# Patient Record
Sex: Female | Born: 1960 | Race: White | Hispanic: No | Marital: Married | Smoking: Never smoker
Health system: Southern US, Community
[De-identification: ages and names within clinical notes are randomized; demographics above are authoritative.]

## PROBLEM LIST (undated history)

## (undated) DIAGNOSIS — I499 Cardiac arrhythmia, unspecified: Secondary | ICD-10-CM

## (undated) DIAGNOSIS — G43909 Migraine, unspecified, not intractable, without status migrainosus: Secondary | ICD-10-CM

## (undated) DIAGNOSIS — M797 Fibromyalgia: Secondary | ICD-10-CM

## (undated) DIAGNOSIS — Z8489 Family history of other specified conditions: Secondary | ICD-10-CM

## (undated) DIAGNOSIS — F32A Depression, unspecified: Secondary | ICD-10-CM

## (undated) DIAGNOSIS — T8859XA Other complications of anesthesia, initial encounter: Secondary | ICD-10-CM

## (undated) DIAGNOSIS — E785 Hyperlipidemia, unspecified: Secondary | ICD-10-CM

## (undated) DIAGNOSIS — G4733 Obstructive sleep apnea (adult) (pediatric): Secondary | ICD-10-CM

## (undated) DIAGNOSIS — I10 Essential (primary) hypertension: Secondary | ICD-10-CM

## (undated) DIAGNOSIS — R569 Unspecified convulsions: Secondary | ICD-10-CM

## (undated) DIAGNOSIS — F319 Bipolar disorder, unspecified: Secondary | ICD-10-CM

## (undated) DIAGNOSIS — F988 Other specified behavioral and emotional disorders with onset usually occurring in childhood and adolescence: Secondary | ICD-10-CM

## (undated) DIAGNOSIS — F419 Anxiety disorder, unspecified: Secondary | ICD-10-CM

## (undated) DIAGNOSIS — K219 Gastro-esophageal reflux disease without esophagitis: Secondary | ICD-10-CM

## (undated) DIAGNOSIS — M1990 Unspecified osteoarthritis, unspecified site: Secondary | ICD-10-CM

## (undated) DIAGNOSIS — T4145XA Adverse effect of unspecified anesthetic, initial encounter: Secondary | ICD-10-CM

## (undated) DIAGNOSIS — F329 Major depressive disorder, single episode, unspecified: Secondary | ICD-10-CM

## (undated) HISTORY — PX: JOINT REPLACEMENT: SHX530

## (undated) HISTORY — PX: COLONOSCOPY: SHX174

## (undated) HISTORY — PX: ABDOMINAL HYSTERECTOMY: SHX81

## (undated) HISTORY — PX: TOTAL KNEE ARTHROPLASTY: SHX125

## (undated) HISTORY — PX: BACK SURGERY: SHX140

---

## 2004-03-19 ENCOUNTER — Ambulatory Visit: Payer: Self-pay | Admitting: Internal Medicine

## 2004-04-11 ENCOUNTER — Ambulatory Visit: Payer: Self-pay | Admitting: Rheumatology

## 2004-06-30 ENCOUNTER — Ambulatory Visit: Payer: Self-pay | Admitting: Internal Medicine

## 2004-07-09 ENCOUNTER — Ambulatory Visit: Payer: Self-pay | Admitting: Internal Medicine

## 2004-07-16 ENCOUNTER — Emergency Department: Payer: Self-pay | Admitting: Internal Medicine

## 2004-08-19 ENCOUNTER — Ambulatory Visit: Payer: Self-pay

## 2005-04-07 ENCOUNTER — Ambulatory Visit: Payer: Self-pay

## 2005-07-07 ENCOUNTER — Ambulatory Visit: Payer: Self-pay | Admitting: Orthopaedic Surgery

## 2005-07-27 ENCOUNTER — Other Ambulatory Visit: Payer: Self-pay

## 2005-07-27 ENCOUNTER — Ambulatory Visit: Payer: Self-pay | Admitting: Orthopaedic Surgery

## 2005-07-28 ENCOUNTER — Ambulatory Visit: Payer: Self-pay | Admitting: Orthopaedic Surgery

## 2005-09-09 ENCOUNTER — Ambulatory Visit: Payer: Self-pay | Admitting: Internal Medicine

## 2006-10-29 ENCOUNTER — Ambulatory Visit: Payer: Self-pay | Admitting: Internal Medicine

## 2007-03-10 ENCOUNTER — Ambulatory Visit: Payer: Self-pay | Admitting: Internal Medicine

## 2007-06-13 ENCOUNTER — Other Ambulatory Visit: Payer: Self-pay

## 2007-06-13 ENCOUNTER — Ambulatory Visit: Payer: Self-pay | Admitting: Orthopaedic Surgery

## 2007-06-21 ENCOUNTER — Inpatient Hospital Stay: Payer: Self-pay | Admitting: Orthopaedic Surgery

## 2007-12-07 ENCOUNTER — Ambulatory Visit: Payer: Self-pay | Admitting: Internal Medicine

## 2008-12-07 ENCOUNTER — Ambulatory Visit: Payer: Self-pay | Admitting: Internal Medicine

## 2010-11-05 ENCOUNTER — Ambulatory Visit: Payer: Self-pay | Admitting: Internal Medicine

## 2012-04-08 ENCOUNTER — Ambulatory Visit: Payer: Self-pay | Admitting: Gastroenterology

## 2012-05-26 ENCOUNTER — Ambulatory Visit: Payer: Self-pay | Admitting: Internal Medicine

## 2013-05-29 ENCOUNTER — Ambulatory Visit: Payer: Self-pay | Admitting: Internal Medicine

## 2014-09-25 ENCOUNTER — Ambulatory Visit: Payer: Medicare Other

## 2014-11-21 ENCOUNTER — Other Ambulatory Visit: Payer: Self-pay | Admitting: Internal Medicine

## 2014-11-21 DIAGNOSIS — Z139 Encounter for screening, unspecified: Secondary | ICD-10-CM

## 2014-11-21 DIAGNOSIS — N951 Menopausal and female climacteric states: Secondary | ICD-10-CM

## 2014-11-23 ENCOUNTER — Other Ambulatory Visit: Payer: Self-pay | Admitting: Internal Medicine

## 2014-11-23 DIAGNOSIS — Z1231 Encounter for screening mammogram for malignant neoplasm of breast: Secondary | ICD-10-CM

## 2014-11-28 ENCOUNTER — Ambulatory Visit: Payer: Medicare Other | Attending: Internal Medicine

## 2015-03-26 HISTORY — PX: HAMMER TOE SURGERY: SHX385

## 2015-05-10 ENCOUNTER — Other Ambulatory Visit: Payer: Self-pay | Admitting: Orthopedic Surgery

## 2015-06-03 ENCOUNTER — Encounter (HOSPITAL_COMMUNITY): Payer: Self-pay | Admitting: *Deleted

## 2015-06-04 ENCOUNTER — Other Ambulatory Visit: Payer: Self-pay

## 2015-06-04 ENCOUNTER — Encounter (HOSPITAL_BASED_OUTPATIENT_CLINIC_OR_DEPARTMENT_OTHER)
Admission: RE | Admit: 2015-06-04 | Discharge: 2015-06-04 | Disposition: A | Discharge status: Home / Self Care | Payer: 59 | Source: Ambulatory Visit | Attending: Orthopedic Surgery | Admitting: Orthopedic Surgery

## 2015-06-04 DIAGNOSIS — G4730 Sleep apnea, unspecified: Secondary | ICD-10-CM | POA: Diagnosis not present

## 2015-06-04 DIAGNOSIS — M19071 Primary osteoarthritis, right ankle and foot: Secondary | ICD-10-CM | POA: Diagnosis not present

## 2015-06-04 DIAGNOSIS — I10 Essential (primary) hypertension: Secondary | ICD-10-CM | POA: Diagnosis not present

## 2015-06-04 DIAGNOSIS — F329 Major depressive disorder, single episode, unspecified: Secondary | ICD-10-CM | POA: Diagnosis not present

## 2015-06-04 DIAGNOSIS — M2042 Other hammer toe(s) (acquired), left foot: Secondary | ICD-10-CM | POA: Diagnosis present

## 2015-06-04 DIAGNOSIS — F319 Bipolar disorder, unspecified: Secondary | ICD-10-CM | POA: Diagnosis not present

## 2015-06-04 DIAGNOSIS — K219 Gastro-esophageal reflux disease without esophagitis: Secondary | ICD-10-CM | POA: Diagnosis not present

## 2015-06-04 DIAGNOSIS — M170 Bilateral primary osteoarthritis of knee: Secondary | ICD-10-CM | POA: Diagnosis not present

## 2015-06-04 DIAGNOSIS — Z79899 Other long term (current) drug therapy: Secondary | ICD-10-CM | POA: Diagnosis not present

## 2015-06-04 DIAGNOSIS — M7742 Metatarsalgia, left foot: Secondary | ICD-10-CM | POA: Diagnosis not present

## 2015-06-04 DIAGNOSIS — M19072 Primary osteoarthritis, left ankle and foot: Secondary | ICD-10-CM | POA: Diagnosis not present

## 2015-06-04 LAB — BASIC METABOLIC PANEL
Anion gap: 9 (ref 5–15)
BUN: 14 mg/dL (ref 6–20)
CALCIUM: 9.5 mg/dL (ref 8.9–10.3)
CO2: 24 mmol/L (ref 22–32)
CREATININE: 0.78 mg/dL (ref 0.44–1.00)
Chloride: 109 mmol/L (ref 101–111)
Glucose, Bld: 113 mg/dL — ABNORMAL HIGH (ref 65–99)
Potassium: 4 mmol/L (ref 3.5–5.1)
SODIUM: 142 mmol/L (ref 135–145)

## 2015-06-06 ENCOUNTER — Encounter (HOSPITAL_BASED_OUTPATIENT_CLINIC_OR_DEPARTMENT_OTHER): Payer: Self-pay | Admitting: Certified Registered"

## 2015-06-06 ENCOUNTER — Ambulatory Visit (HOSPITAL_BASED_OUTPATIENT_CLINIC_OR_DEPARTMENT_OTHER): Payer: 59 | Admitting: Certified Registered"

## 2015-06-06 ENCOUNTER — Ambulatory Visit (HOSPITAL_COMMUNITY)
Admission: RE | Admit: 2015-06-06 | Discharge: 2015-06-06 | Disposition: A | Discharge status: Home / Self Care | Payer: 59 | Source: Ambulatory Visit | Attending: Orthopedic Surgery | Admitting: Orthopedic Surgery

## 2015-06-06 ENCOUNTER — Encounter (HOSPITAL_BASED_OUTPATIENT_CLINIC_OR_DEPARTMENT_OTHER)
Admission: RE | Disposition: A | Discharge status: Home / Self Care | Payer: Self-pay | Source: Ambulatory Visit | Attending: Orthopedic Surgery

## 2015-06-06 DIAGNOSIS — M19072 Primary osteoarthritis, left ankle and foot: Secondary | ICD-10-CM | POA: Insufficient documentation

## 2015-06-06 DIAGNOSIS — M2042 Other hammer toe(s) (acquired), left foot: Secondary | ICD-10-CM | POA: Insufficient documentation

## 2015-06-06 DIAGNOSIS — F329 Major depressive disorder, single episode, unspecified: Secondary | ICD-10-CM | POA: Insufficient documentation

## 2015-06-06 DIAGNOSIS — I10 Essential (primary) hypertension: Secondary | ICD-10-CM | POA: Insufficient documentation

## 2015-06-06 DIAGNOSIS — M25572 Pain in left ankle and joints of left foot: Secondary | ICD-10-CM

## 2015-06-06 DIAGNOSIS — G4730 Sleep apnea, unspecified: Secondary | ICD-10-CM | POA: Insufficient documentation

## 2015-06-06 DIAGNOSIS — K219 Gastro-esophageal reflux disease without esophagitis: Secondary | ICD-10-CM | POA: Insufficient documentation

## 2015-06-06 DIAGNOSIS — M7742 Metatarsalgia, left foot: Secondary | ICD-10-CM | POA: Insufficient documentation

## 2015-06-06 DIAGNOSIS — M19071 Primary osteoarthritis, right ankle and foot: Secondary | ICD-10-CM | POA: Insufficient documentation

## 2015-06-06 DIAGNOSIS — Z79899 Other long term (current) drug therapy: Secondary | ICD-10-CM | POA: Insufficient documentation

## 2015-06-06 DIAGNOSIS — F319 Bipolar disorder, unspecified: Secondary | ICD-10-CM | POA: Insufficient documentation

## 2015-06-06 DIAGNOSIS — M170 Bilateral primary osteoarthritis of knee: Secondary | ICD-10-CM | POA: Insufficient documentation

## 2015-06-06 HISTORY — DX: Essential (primary) hypertension: I10

## 2015-06-06 HISTORY — DX: Anxiety disorder, unspecified: F41.9

## 2015-06-06 HISTORY — DX: Bipolar disorder, unspecified: F31.9

## 2015-06-06 HISTORY — DX: Major depressive disorder, single episode, unspecified: F32.9

## 2015-06-06 HISTORY — PX: WEIL OSTEOTOMY: SHX5044

## 2015-06-06 HISTORY — DX: Unspecified osteoarthritis, unspecified site: M19.90

## 2015-06-06 HISTORY — PX: HAMMER TOE SURGERY: SHX385

## 2015-06-06 HISTORY — DX: Adverse effect of unspecified anesthetic, initial encounter: T41.45XA

## 2015-06-06 HISTORY — DX: Gastro-esophageal reflux disease without esophagitis: K21.9

## 2015-06-06 HISTORY — DX: Depression, unspecified: F32.A

## 2015-06-06 HISTORY — DX: Other complications of anesthesia, initial encounter: T88.59XA

## 2015-06-06 SURGERY — OSTEOTOMY, WEIL
Anesthesia: General | Site: Foot | Laterality: Left

## 2015-06-06 MED ORDER — ONDANSETRON HCL 4 MG/2ML IJ SOLN: 4.0000 mg | Freq: Once | INTRAMUSCULAR | Status: DC | PRN

## 2015-06-06 MED ORDER — SODIUM CHLORIDE 0.9 % IV SOLN: INTRAVENOUS | Status: DC

## 2015-06-06 MED ORDER — MIDAZOLAM HCL 2 MG/2ML IJ SOLN
INTRAMUSCULAR | Status: AC
  Filled 2015-06-06: qty 2

## 2015-06-06 MED ORDER — PHENYLEPHRINE 40 MCG/ML (10ML) SYRINGE FOR IV PUSH (FOR BLOOD PRESSURE SUPPORT)
PREFILLED_SYRINGE | INTRAVENOUS | Status: AC
  Filled 2015-06-06: qty 10

## 2015-06-06 MED ORDER — HYDROMORPHONE HCL 1 MG/ML IJ SOLN: 0.5000 mg | INTRAMUSCULAR | Status: DC | PRN

## 2015-06-06 MED ORDER — LIDOCAINE HCL (CARDIAC) 20 MG/ML IV SOLN
INTRAVENOUS | Status: DC | PRN
  Administered 2015-06-06: 30 mg via INTRAVENOUS

## 2015-06-06 MED ORDER — FENTANYL CITRATE (PF) 100 MCG/2ML IJ SOLN
50.0000 ug | INTRAMUSCULAR | Status: DC | PRN
  Administered 2015-06-06: 100 ug via INTRAVENOUS
  Administered 2015-06-06: 25 ug via INTRAVENOUS

## 2015-06-06 MED ORDER — ONDANSETRON HCL 4 MG/2ML IJ SOLN
INTRAMUSCULAR | Status: AC
  Filled 2015-06-06: qty 2

## 2015-06-06 MED ORDER — CEFAZOLIN SODIUM-DEXTROSE 2-3 GM-% IV SOLR
INTRAVENOUS | Status: AC
  Filled 2015-06-06: qty 50

## 2015-06-06 MED ORDER — LIDOCAINE HCL (CARDIAC) 20 MG/ML IV SOLN
INTRAVENOUS | Status: AC
  Filled 2015-06-06: qty 5

## 2015-06-06 MED ORDER — OXYCODONE HCL 5 MG PO TABS: 5.0000 mg | ORAL_TABLET | ORAL | Status: DC | PRN

## 2015-06-06 MED ORDER — DEXAMETHASONE SODIUM PHOSPHATE 10 MG/ML IJ SOLN
INTRAMUSCULAR | Status: AC
  Filled 2015-06-06: qty 1

## 2015-06-06 MED ORDER — PHENYLEPHRINE HCL 10 MG/ML IJ SOLN
INTRAMUSCULAR | Status: DC | PRN
  Administered 2015-06-06 (×2): 40 ug via INTRAVENOUS

## 2015-06-06 MED ORDER — CHLORHEXIDINE GLUCONATE 4 % EX LIQD: 60.0000 mL | Freq: Once | CUTANEOUS | Status: DC

## 2015-06-06 MED ORDER — 0.9 % SODIUM CHLORIDE (POUR BTL) OPTIME
TOPICAL | Status: DC | PRN
Start: 2015-06-06 — End: 2015-06-06
  Administered 2015-06-06: 1000 mL

## 2015-06-06 MED ORDER — PROPOFOL 10 MG/ML IV BOLUS
INTRAVENOUS | Status: DC | PRN
  Administered 2015-06-06: 200 mg via INTRAVENOUS

## 2015-06-06 MED ORDER — DEXAMETHASONE SODIUM PHOSPHATE 10 MG/ML IJ SOLN
INTRAMUSCULAR | Status: DC | PRN
  Administered 2015-06-06: 10 mg via INTRAVENOUS

## 2015-06-06 MED ORDER — FENTANYL CITRATE (PF) 100 MCG/2ML IJ SOLN
INTRAMUSCULAR | Status: AC
  Filled 2015-06-06: qty 2

## 2015-06-06 MED ORDER — GLYCOPYRROLATE 0.2 MG/ML IJ SOLN: 0.2000 mg | Freq: Once | INTRAMUSCULAR | Status: DC | PRN

## 2015-06-06 MED ORDER — MIDAZOLAM HCL 2 MG/2ML IJ SOLN: 1.0000 mg | INTRAMUSCULAR | Status: DC | PRN

## 2015-06-06 MED ORDER — ONDANSETRON HCL 4 MG/2ML IJ SOLN
INTRAMUSCULAR | Status: DC | PRN
  Administered 2015-06-06: 4 mg via INTRAVENOUS

## 2015-06-06 MED ORDER — CEFAZOLIN SODIUM-DEXTROSE 2-3 GM-% IV SOLR
2.0000 g | INTRAVENOUS | Status: AC
  Administered 2015-06-06: 2 g via INTRAVENOUS

## 2015-06-06 MED ORDER — SCOPOLAMINE 1 MG/3DAYS TD PT72: 1.0000 | MEDICATED_PATCH | Freq: Once | TRANSDERMAL | Status: DC

## 2015-06-06 MED ORDER — VANCOMYCIN HCL 500 MG IV SOLR
INTRAVENOUS | Status: DC | PRN
Start: 2015-06-06 — End: 2015-06-06
  Administered 2015-06-06: 500 mg

## 2015-06-06 MED ORDER — SENNA 8.6 MG PO TABS: 2.0000 | ORAL_TABLET | Freq: Two times a day (BID) | ORAL | Status: DC

## 2015-06-06 MED ORDER — LACTATED RINGERS IV SOLN
INTRAVENOUS | Status: DC
  Administered 2015-06-06: 11:00:00 via INTRAVENOUS

## 2015-06-06 MED ORDER — DOCUSATE SODIUM 100 MG PO CAPS: 100.0000 mg | ORAL_CAPSULE | Freq: Two times a day (BID) | ORAL | Status: DC

## 2015-06-06 SURGICAL SUPPLY — 88 items
BANDAGE ESMARK 6X9 LF (GAUZE/BANDAGES/DRESSINGS) ×2 IMPLANT
BANDAGE GAUZE 4  KLING STR (GAUZE/BANDAGES/DRESSINGS) IMPLANT
BLADE AVERAGE 25X9 (BLADE) IMPLANT
BLADE LONG MED 25X9 (BLADE) ×3 IMPLANT
BLADE OSC/SAG .038X5.5 CUT EDG (BLADE) ×3 IMPLANT
BLADE SURG 15 STRL LF DISP TIS (BLADE) ×6 IMPLANT
BLADE SURG 15 STRL SS (BLADE) ×3
BNDG COHESIVE 4X5 TAN STRL (GAUZE/BANDAGES/DRESSINGS) ×3 IMPLANT
BNDG COHESIVE 6X5 TAN STRL LF (GAUZE/BANDAGES/DRESSINGS) IMPLANT
BNDG CONFORM 2 STRL LF (GAUZE/BANDAGES/DRESSINGS) IMPLANT
BNDG CONFORM 3 STRL LF (GAUZE/BANDAGES/DRESSINGS) ×3 IMPLANT
BNDG ESMARK 4X9 LF (GAUZE/BANDAGES/DRESSINGS) IMPLANT
BNDG ESMARK 6X9 LF (GAUZE/BANDAGES/DRESSINGS) ×3
CAP PIN PROTECTOR ORTHO WHT (CAP) IMPLANT
CHLORAPREP W/TINT 26ML (MISCELLANEOUS) ×3 IMPLANT
COVER BACK TABLE 60X90IN (DRAPES) ×3 IMPLANT
CUFF TOURNIQUET SINGLE 24IN (TOURNIQUET CUFF) IMPLANT
CUFF TOURNIQUET SINGLE 34IN LL (TOURNIQUET CUFF) ×3 IMPLANT
DECANTER SPIKE VIAL GLASS SM (MISCELLANEOUS) IMPLANT
DRAPE EXTREMITY T 121X128X90 (DRAPE) ×3 IMPLANT
DRAPE OEC MINIVIEW 54X84 (DRAPES) ×3 IMPLANT
DRAPE U-SHAPE 47X51 STRL (DRAPES) ×3 IMPLANT
DRSG MEPITEL 4X7.2 (GAUZE/BANDAGES/DRESSINGS) ×3 IMPLANT
DRSG PAD ABDOMINAL 8X10 ST (GAUZE/BANDAGES/DRESSINGS) ×6 IMPLANT
ELECT REM PT RETURN 9FT ADLT (ELECTROSURGICAL) ×3
ELECTRODE REM PT RTRN 9FT ADLT (ELECTROSURGICAL) ×2 IMPLANT
GAUZE SPONGE 4X4 12PLY STRL (GAUZE/BANDAGES/DRESSINGS) ×3 IMPLANT
GLOVE BIO SURGEON STRL SZ8 (GLOVE) ×3 IMPLANT
GLOVE BIOGEL PI IND STRL 7.0 (GLOVE) ×2 IMPLANT
GLOVE BIOGEL PI IND STRL 8 (GLOVE) ×4 IMPLANT
GLOVE BIOGEL PI INDICATOR 7.0 (GLOVE) ×1
GLOVE BIOGEL PI INDICATOR 8 (GLOVE) ×2
GLOVE ECLIPSE 6.5 STRL STRAW (GLOVE) ×3 IMPLANT
GLOVE ECLIPSE 7.5 STRL STRAW (GLOVE) ×3 IMPLANT
GLOVE EXAM NITRILE MD LF STRL (GLOVE) ×3 IMPLANT
GOWN STRL REUS W/ TWL LRG LVL3 (GOWN DISPOSABLE) ×2 IMPLANT
GOWN STRL REUS W/ TWL XL LVL3 (GOWN DISPOSABLE) ×4 IMPLANT
GOWN STRL REUS W/TWL LRG LVL3 (GOWN DISPOSABLE) ×1
GOWN STRL REUS W/TWL XL LVL3 (GOWN DISPOSABLE) ×2
GUIDEWIRE ORTHO 062 (WIRE) ×3 IMPLANT
K-WIRE .054X4 (WIRE) IMPLANT
K-WIRE .062X4 (WIRE) IMPLANT
NDL SUT 6 .5 CRC .975X.05 MAYO (NEEDLE) IMPLANT
NEEDLE HYPO 22GX1.5 SAFETY (NEEDLE) IMPLANT
NEEDLE HYPO 25X1 1.5 SAFETY (NEEDLE) IMPLANT
NEEDLE MAYO TAPER (NEEDLE)
NS IRRIG 1000ML POUR BTL (IV SOLUTION) ×3 IMPLANT
PACK BASIN DAY SURGERY FS (CUSTOM PROCEDURE TRAY) ×3 IMPLANT
PAD CAST 4YDX4 CTTN HI CHSV (CAST SUPPLIES) ×2 IMPLANT
PADDING CAST ABS 4INX4YD NS (CAST SUPPLIES)
PADDING CAST ABS COTTON 4X4 ST (CAST SUPPLIES) IMPLANT
PADDING CAST COTTON 4X4 STRL (CAST SUPPLIES) ×1
PADDING CAST COTTON 6X4 STRL (CAST SUPPLIES) ×3 IMPLANT
PASSER SUT SWANSON 36MM LOOP (INSTRUMENTS) IMPLANT
PENCIL BUTTON HOLSTER BLD 10FT (ELECTRODE) ×3 IMPLANT
SANITIZER HAND PURELL 535ML FO (MISCELLANEOUS) ×3 IMPLANT
SCREW ACUTRAK FUSION 24MM (Screw) ×3 IMPLANT
SCREW FUSION ACUTRAK 30 3000 (Screw) ×2 IMPLANT
SCREW FUSION ACUTRAK 30MM 3000 (Screw) ×3 IMPLANT
SCREW QUICK SNAP 2.0X12MM (Screw) ×3 IMPLANT
SCREW QUICK SNAP 2.0X14MM (Screw) ×3 IMPLANT
SHEET MEDIUM DRAPE 40X70 STRL (DRAPES) ×3 IMPLANT
SLEEVE SCD COMPRESS KNEE MED (MISCELLANEOUS) ×3 IMPLANT
SPLINT FAST PLASTER 5X30 (CAST SUPPLIES)
SPLINT PLASTER CAST FAST 5X30 (CAST SUPPLIES) IMPLANT
SPONGE LAP 18X18 X RAY DECT (DISPOSABLE) ×3 IMPLANT
STOCKINETTE 6  STRL (DRAPES) ×1
STOCKINETTE 6 STRL (DRAPES) ×2 IMPLANT
SUCTION FRAZIER HANDLE 10FR (MISCELLANEOUS) ×1
SUCTION TUBE FRAZIER 10FR DISP (MISCELLANEOUS) ×2 IMPLANT
SUT ETHIBOND 0 MO6 C/R (SUTURE) IMPLANT
SUT ETHIBOND 2 OS 4 DA (SUTURE) IMPLANT
SUT ETHILON 3 0 PS 1 (SUTURE) ×3 IMPLANT
SUT FIBERWIRE 2-0 18 17.9 3/8 (SUTURE)
SUT MERSILENE 2.0 SH NDLE (SUTURE) IMPLANT
SUT MNCRL AB 3-0 PS2 18 (SUTURE) ×3 IMPLANT
SUT MNCRL AB 4-0 PS2 18 (SUTURE) IMPLANT
SUT VIC AB 0 SH 27 (SUTURE) IMPLANT
SUT VIC AB 2-0 SH 27 (SUTURE)
SUT VIC AB 2-0 SH 27XBRD (SUTURE) IMPLANT
SUT VICRYL 0 UR6 27IN ABS (SUTURE) ×3 IMPLANT
SUTURE FIBERWR 2-0 18 17.9 3/8 (SUTURE) IMPLANT
SYR BULB 3OZ (MISCELLANEOUS) ×3 IMPLANT
SYR CONTROL 10ML LL (SYRINGE) IMPLANT
TOWEL OR 17X24 6PK STRL BLUE (TOWEL DISPOSABLE) ×9 IMPLANT
TUBE CONNECTING 20X1/4 (TUBING) ×3 IMPLANT
UNDERPAD 30X30 (UNDERPADS AND DIAPERS) ×3 IMPLANT
YANKAUER SUCT BULB TIP NO VENT (SUCTIONS) IMPLANT

## 2015-06-06 NOTE — Discharge Instructions (Addendum)
Whitney Simmer, MD [place]  Please read the following information regarding your care after surgery.  Medications  You only need a prescription for the narcotic pain medicine (ex. oxycodone, Percocet, Norco).  All of the other medicines listed below are available over the counter. X acetominophen (Tylenol) 650 mg every 4-6 hours as you need for minor pain X oxycodone as prescribed for moderate to severe pain ?   Narcotic pain medicine (ex. oxycodone, Percocet, Vicodin) will cause constipation.  To prevent this problem, take the following medicines while you are taking any pain medicine. X docusate sodium (Colace) 100 mg twice a day X senna (Senokot) 2 tablets twice a day   Weight Bearing X Bear weight when you are able on your operated leg or foot in flat post-op shoe.  Cast / Splint / Dressing X Keep your splint or cast clean and dry.  Dont put anything (coat hanger, pencil, etc) down inside of it.  If it gets damp, use a hair dryer on the cool setting to dry it.  If it gets soaked, call the office to schedule an appointment for a cast change.   After your dressing, cast or splint is removed; you may shower, but do not soak or scrub the wound.  Allow the water to run over it, and then gently pat it dry.  Swelling It is normal for you to have swelling where you had surgery.  To reduce swelling and pain, keep your toes above your nose for at least 3 days after surgery.  It may be necessary to keep your foot or leg elevated for several weeks.  If it hurts, it should be elevated.  Follow Up Call my office at [phone] when you are discharged from the hospital or surgery center to schedule an appointment to be seen two weeks after surgery.  Call my office at [phone] if you develop a fever >101.5 F, nausea, vomiting, bleeding from the surgical site or severe pain.     Regional Anesthesia Blocks  1. Numbness or the inability to move the "blocked" extremity may last  from 3-48 hours after placement. The length of time depends on the medication injected and your individual response to the medication. If the numbness is not going away after 48 hours, call your surgeon.  2. The extremity that is blocked will need to be protected until the numbness is gone and the  Strength has returned. Because you cannot feel it, you will need to take extra care to avoid injury. Because it may be weak, you may have difficulty moving it or using it. You may not know what position it is in without looking at it while the block is in effect.  3. For blocks in the legs and feet, returning to weight bearing and walking needs to be done carefully. You will need to wait until the numbness is entirely gone and the strength has returned. You should be able to move your leg and foot normally before you try and bear weight or walk. You will need someone to be with you when you first try to ensure you do not fall and possibly risk injury.  4. Bruising and tenderness at the needle site are common side effects and will resolve in a few days.  5. Persistent numbness or new problems with movement should be communicated to the surgeon or the [facility] [phone] [place] [phone]).     Post Anesthesia Home Care Instructions  Activity: Get  plenty of rest for the remainder of the day. A responsible adult should stay with you for 24 hours following the procedure.  For the next 24 hours, DO NOT: -Drive a car -Paediatric nurse -Drink alcoholic beverages -Take any medication unless instructed by your physician -Make any legal decisions or sign important papers.  Meals: Start with liquid foods such as gelatin or soup. Progress to regular foods as tolerated. Avoid greasy, spicy, heavy foods. If nausea and/or vomiting occur, drink only clear liquids until the nausea and/or vomiting subsides. Call your physician if vomiting continues.  Special  Instructions/Symptoms: Your throat may feel dry or sore from the anesthesia or the breathing tube placed in your throat during surgery. If this causes discomfort, gargle with warm salt water. The discomfort should disappear within 24 hours.  If you had a scopolamine patch placed behind your ear for the management of post- operative nausea and/or vomiting:  1. The medication in the patch is effective for 72 hours, after which it should be removed.  Wrap patch in a tissue and discard in the trash. Wash hands thoroughly with soap and water. 2. You may remove the patch earlier than 72 hours if you experience unpleasant side effects which may include dry mouth, dizziness or visual disturbances. 3. Avoid touching the patch. Wash your hands with soap and water after contact with the patch.

## 2015-06-06 NOTE — Op Note (Signed)
NAME:  Whitney Lewis, Whitney Lewis            ACCOUNT NO.:  [id number]  MEDICAL RECORD NO.:  [id number]  LOCATION:  PERIO                        FACILITY:  [facility]  PHYSICIAN:  [name], MD        DATE OF BIRTH:  1960/06/11  DATE OF PROCEDURE:  06/06/2015 DATE OF DISCHARGE:                              OPERATIVE REPORT   PREOPERATIVE DIAGNOSIS:  Left second and third metatarsalgia and hammertoes.  POSTOPERATIVE DIAGNOSIS:  Left second and third metatarsalgia and hammertoes.  PROCEDURE: 1. Left second metatarsal Weil osteotomy. 2. Left third metatarsal Weil osteotomy through a separate incision. 3. Left second hammertoe correction through a separate incision. 4. Left third hammertoe correction through a separate incision. 5. AP and lateral radiographs of the left foot.  SURGEON:  [name], MD.  ASSISTANT:  Mechele Claude, PA-C.  ANESTHESIA:  General, regional.  ESTIMATED BLOOD LOSS:  Minimal.  TOURNIQUET TIME:  Approximately 30 minutes at 250 mmHg.  COMPLICATIONS:  None apparent.  DISPOSITION:  Extubated, awake, and stable to recovery.  INDICATIONS FOR PROCEDURE:  The patient is a 55 year old female who has a painful left forefoot deformities including metatarsalgia and second and third hammertoes.  She has failed nonoperative treatment to date and presents today for surgical correction.  She understands the risks and benefits, the alternative treatment options, and elects surgical treatment.  She specifically understands risks of bleeding, infection, nerve damage, blood clots, need for additional surgery, continued pain, recurrence of her deformities, amputation, and death.  PROCEDURE IN DETAIL:  After preoperative consent was obtained and the correct operative site was identified, the patient was brought to the operating room and placed supine on the operating table.  General anesthesia was induced.  Preoperative antibiotics were administered. Surgical time-out was taken.   Left lower extremity was prepped and draped in standard sterile fashion with a tourniquet around the thigh. The extremity was exsanguinated and tourniquet was inflated to 250 mmHg. A longitudinal incision was made over the second MTP joint.  Sharp dissection was carried down through skin and subcutaneous tissue. Capsulotomy was made and the metatarsal head was exposed.  A Weil osteotomy was made in the metatarsal head removing a small wafer of bone distally.  The metatarsal was allowed to shorten several millimeters and was fixed with a 14-mm Acumed twist-off screw.  Attention was then turned to the third MTP joint where the same procedure was performed again fixing the osteotomy with a 12-mm Acumed twist-off screw.  The overhanging bone at both metatarsals was trimmed with a rongeur.  AP and lateral radiographs confirmed appropriate position of the screws, appropriate lengths of the metatarsals.  Attention was then turned to the second toe where a transverse incision was made over the PIP joint.  Sharp dissection was carried down through the skin and subcutaneous tissue.  The extensor mechanism was incised. The collateral ligaments were released.  The head of the proximal phalanx was resected with the oscillating saw.  The base of the middle phalanx was resected with a rongeur and curette.  The hammertoe was reduced and an Acumed Acutrak screw was used to fix the PIP joint.  Attention was turned to the third toe where the same procedure  was performed to correct the third hammertoe deformity with a 24-mm Acutrak screw.  AP and lateral radiographs confirmed appropriate position and length of all hardware and appropriate correction of the metatarsalgia as well as hammertoe deformities.  Wounds were irrigated copiously. Vancomycin powder was sprinkled in the wounds.  The subcutaneous tissues were approximated with Monocryl, the skin incisions were closed with nylon.  Sterile dressings  were applied, followed by a well-padded compression wrap.  Tourniquet was released at 32 minutes after application of the dressings.  The patient was awakened from anesthesia and transported to the recovery room in stable condition.  FOLLOWUP PLAN:  The patient will be weightbearing as tolerated on her heel in a flat postop shoe.  She will follow up with me in the office in 2 weeks for suture removal.  RADIOGRAPHS:  AP and lateral radiographs of the left foot were obtained intraoperatively.  These showed interval correction of hammertoe and metatarsalgia deformities with appropriately positioned hardware.  No other acute injuries are noted.  Mechele Claude, PA-C, was present and scrubbed for the duration of the case.  His assistance was essential in positioning the patient, prepping and draping, gaining and maintaining exposure, performing the operation, and closing and dressing the wounds.     [name], MD     JH/MEDQ  D:  06/06/2015  T:  06/06/2015  Job:  [id number]

## 2015-06-06 NOTE — Anesthesia Postprocedure Evaluation (Signed)
Anesthesia Post Note  Patient: Whitney Lewis  Procedure(s) Performed: Procedure(s) (LRB): LEFT TWO AND THREE METATARSAL WEIL OSTEOTOMY  (Left) LEFT TWO-THREE  HAMMER TOE CORRECTION  (Left)  Patient location during evaluation: PACU Anesthesia Type: General and Regional Level of consciousness: awake, oriented and patient cooperative Pain management: pain level controlled Vital Signs Assessment: post-procedure vital signs reviewed and stable Respiratory status: spontaneous breathing Cardiovascular status: blood pressure returned to baseline Postop Assessment: no headache Anesthetic complications: no    Last Vitals:  Filed Vitals:   06/06/15 1245 06/06/15 1315  BP: 97/71 98/75  Pulse: 90 87  Temp:  36.4 C  Resp: 13 16    Last Pain:  Filed Vitals:   06/06/15 1324  PainSc: 0-No pain                 Pharoah Goggins EDWARD

## 2015-06-06 NOTE — Progress Notes (Signed)
Assisted Dr. Tamala Julian with left, ankle block. Side rails up, monitors on throughout procedure. See vital signs in flow sheet. Tolerated Procedure well.

## 2015-06-06 NOTE — Brief Op Note (Addendum)
06/06/2015  11:56 AM  PATIENT:  Whitney Lewis  55 y.o. female  PRE-OPERATIVE DIAGNOSIS:  LEFT TWO-THREE HAMMERTOES AND METATARSALGIA  POST-OPERATIVE DIAGNOSIS:  LEFT TWO-THREE HAMMERTOES AND METATARSALGIA  Procedure(s): LEFT TWO AND THREE METATARSAL WEIL OSTEOTOMY (separate incisions) LEFT TWO-THREE  HAMMER TOE CORRECTION (separate incisions) Left foot AP and lateral xrays  SURGEON:  [name], MD  ASSISTANT: Mechele Claude, PA-C  ANESTHESIA:   General, regional  EBL:  minimal   TOURNIQUET:   Total Tourniquet Time Documented: Thigh (Left) - 32 minutes Total: Thigh (Left) - 32 minutes   COMPLICATIONS:  None apparent  DISPOSITION:  Extubated, awake and stable to recovery.  DICTATION ID:  [id number]

## 2015-06-06 NOTE — H&P (Signed)
Whitney Lewis is an 55 y.o. female.   Chief Complaint: left forefoot pain HPI: 55 y/o female with left 2nd and 3rd metatarsalgia and hammertoes.  She has failed non op treatment and presents today for surgical correction.  Past Medical History  Diagnosis Date  . Hypertension   . Sleep apnea     wears mouth piece, no CPAP  . Complication of anesthesia     hard to wake up  . Depression   . Bipolar disorder ([place])   . Anxiety   . GERD (gastroesophageal reflux disease)   . Headache   . Arthritis     feet, knees    Past Surgical History  Procedure Laterality Date  . Joint replacement Left   . Abdominal hysterectomy    . Foot surgery Right   . Cesarean section      History reviewed. No pertinent family history. Social History:  reports that she has never smoked. She does not have any smokeless tobacco history on file. She reports that she drinks alcohol. She reports that she does not use illicit drugs.  Allergies:  Allergies  Allergen Reactions  . Codeine Nausea Only    Medications Prior to Admission  Medication Sig Dispense Refill  . amphetamine-dextroamphetamine (ADDERALL) 30 MG tablet Take 30 mg by mouth daily.    . ARIPiprazole (ABILIFY) 15 MG tablet Take 15 mg by mouth daily.    . clonazePAM (KLONOPIN) 1 MG tablet Take 1 mg by mouth 2 (two) times daily.    . DULoxetine (CYMBALTA) 60 MG capsule Take 120 mg by mouth daily.    Marland Kitchen esomeprazole (NEXIUM) 40 MG capsule Take 40 mg by mouth daily at 12 noon.    Marland Kitchen lisinopril (PRINIVIL,ZESTRIL) 20 MG tablet Take 20 mg by mouth daily.    . Multiple Vitamin (MULTIVITAMIN WITH MINERALS) TABS tablet Take 1 tablet by mouth daily.    . phentermine 37.5 MG capsule Take 37.5 mg by mouth every morning.    . simvastatin (ZOCOR) 20 MG tablet Take 20 mg by mouth daily.    Marland Kitchen topiramate (TOPAMAX) 25 MG tablet Take 25 mg by mouth 2 (two) times daily.      Results for orders placed or performed during the hospital encounter of 06/06/15  (from the past 48 hour(s))  Basic metabolic panel     Status: Abnormal   Collection Time: 06/04/15  2:45 PM  Result Value Ref Range   Sodium 142 135 - 145 mmol/L   Potassium 4.0 3.5 - 5.1 mmol/L   Chloride 109 101 - 111 mmol/L   CO2 24 22 - 32 mmol/L   Glucose, Bld 113 (H) 65 - 99 mg/dL   BUN 14 6 - 20 mg/dL   Creatinine, Ser 0.78 0.44 - 1.00 mg/dL   Calcium 9.5 8.9 - 10.3 mg/dL   GFR calc non Af Amer >60 >60 mL/min   GFR calc Af Amer >60 >60 mL/min    Comment: (NOTE) The eGFR has been calculated using the CKD EPI equation. This calculation has not been validated in all clinical situations. eGFR's persistently <60 mL/min signify possible Chronic Kidney Disease.    Anion gap 9 5 - 15   No results found.  ROS  No recent f/c/n/v/wt loss  Height 5' 6.5" (1.689 m), weight 83.915 kg (185 lb). Physical Exam  wn wd woman in nad.  A and O x 4.  Mood and affect normal.  EOMi.  resp unlabored.  L foot with 2nd and 3rd  hammertoe deformities.  Skin healthy and intact.  No lymphadenopathy.  5/5 strength in PF and DF of the toes.  Sens to LT intact at the forefoot.  Assessment/Plan L 2nd and 3rd hammertoes and metatarsalgia.  To OR for surgical correction.  The risks and benefits of the alternative treatment options have been discussed in detail.  The patient wishes to proceed with surgery and specifically understands risks of bleeding, infection, nerve damage, blood clots, need for additional surgery, amputation and death.   Whitney Lewis Jul 02, 2015, 10:36 AM

## 2015-06-06 NOTE — Transfer of Care (Signed)
Immediate Anesthesia Transfer of Care Note  Patient: Whitney Lewis  Procedure(s) Performed: Procedure(s): LEFT TWO AND THREE METATARSAL WEIL OSTEOTOMY  (Left) LEFT TWO-THREE  HAMMER TOE CORRECTION  (Left)  Patient Location: PACU  Anesthesia Type:GA combined with regional for post-op pain  Level of Consciousness: awake and patient cooperative  Airway & Oxygen Therapy: Patient Spontanous Breathing and Patient connected to face mask oxygen  Post-op Assessment: Report given to RN and Post -op Vital signs reviewed and stable  Post vital signs: Reviewed and stable  Last Vitals:  Filed Vitals:   06/06/15 1045 06/06/15 1049  BP: 93/63   Pulse: 79 77  Temp:    Resp: 20 13    Complications: No apparent anesthesia complications

## 2015-06-06 NOTE — Anesthesia Preprocedure Evaluation (Addendum)
Anesthesia Evaluation  Patient identified by MRN, date of birth, ID band Patient awake    Reviewed: Allergy & Precautions, NPO status , Patient's Chart, lab work & pertinent test results  Airway Mallampati: II  TM Distance: >3 FB     Dental   Pulmonary sleep apnea ,    Pulmonary exam normal        Cardiovascular hypertension, Pt. on medications Normal cardiovascular exam     Neuro/Psych  Headaches, PSYCHIATRIC DISORDERS Anxiety Depression Bipolar Disorder    GI/Hepatic GERD  Medicated,  Endo/Other    Renal/GU      Musculoskeletal  (+) Arthritis ,   Abdominal   Peds  Hematology   Anesthesia Other Findings   Reproductive/Obstetrics                           Anesthesia Physical Anesthesia Plan  ASA: II  Anesthesia Plan: General LMA and General   Post-op Pain Management: MAC Combined w/ Regional for Post-op pain and GA combined w/ Regional for post-op pain   Induction: Intravenous  Airway Management Planned: LMA  Additional Equipment:   Intra-op Plan:   Post-operative Plan: Extubation in OR  Informed Consent: I have reviewed the patients History and Physical, chart, labs and discussed the procedure including the risks, benefits and alternatives for the proposed anesthesia with the patient or authorized representative who has indicated his/her understanding and acceptance.     Plan Discussed with: CRNA, Anesthesiologist and Surgeon  Anesthesia Plan Comments:        Anesthesia Quick Evaluation

## 2015-06-06 NOTE — Anesthesia Procedure Notes (Addendum)
Procedure Name: LMA Insertion Date/Time: 06/06/2015 11:03 AM Performed by: BLOCKER, TIMOTHY D Pre-anesthesia Checklist: Patient identified, Emergency Drugs available, Suction available and Patient being monitored Patient Re-evaluated:Patient Re-evaluated prior to inductionOxygen Delivery Method: Circle System Utilized Preoxygenation: Pre-oxygenation with 100% oxygen Intubation Type: IV induction Ventilation: Mask ventilation without difficulty LMA: LMA inserted LMA Size: 4.0 Number of attempts: 1 Airway Equipment and Method: Bite block Placement Confirmation: positive ETCO2 Tube secured with: Tape Dental Injury: Teeth and Oropharynx as per pre-operative assessment     Anesthesia Regional Block:  Ankle block  Pre-Anesthetic Checklist: ,, timeout performed, Correct Patient, Correct Site, Correct Laterality, Correct Procedure, Correct Position, site marked, Risks and benefits discussed,  Surgical consent,  Pre-op evaluation,  At surgeon's request and post-op pain management  Laterality: Left  Prep: Maximum Sterile Barrier Precautions used, chloraprep and alcohol swabs       Needles:  Injection technique: Single-shot      Needle Gauge: 25 and 25 G    Additional Needles: Ankle block Narrative:  Start time: 06/06/2015 10:55 AM End time: 06/06/2015 10:57 AM Injection made incrementally with aspirations every 5 mL.  Performed by: Personally  Anesthesiologist: Kate Sable  Additional Notes: Pt accept procedure w/ risks. 20cc 0.5% Marcaine w/o difficulty. GES

## 2015-06-07 ENCOUNTER — Encounter (HOSPITAL_BASED_OUTPATIENT_CLINIC_OR_DEPARTMENT_OTHER): Payer: Self-pay | Admitting: Orthopedic Surgery

## 2015-07-01 ENCOUNTER — Other Ambulatory Visit: Payer: Self-pay | Admitting: Orthopedic Surgery

## 2015-07-03 ENCOUNTER — Encounter (HOSPITAL_COMMUNITY): Payer: Self-pay | Admitting: *Deleted

## 2015-07-03 ENCOUNTER — Other Ambulatory Visit: Payer: Self-pay | Admitting: Orthopedic Surgery

## 2015-07-03 NOTE — Progress Notes (Signed)
Pt denies SOB, chest pain, and being under the care of a cardiologist. Pt denies having a cardiac cath and stress test but stated that an echo was done >25 years ago. Pt denies having an EKG and chest x ray within the last year. Pt made aware to stop taking Aspirin, otc vitamins, fish oil, Phentermine and herbal medications. Do not take any NSAIDs ie: Ibuprofen, Advil, Naproxen or any medication containing Aspirin. Ebony Hail, [place] , Anesthesia made aware that pt last dose of Phentermine was Saturday, 06/29/15 ( per pt). Pt verbalized understanding of all pre-op instructions.

## 2015-07-04 ENCOUNTER — Encounter (HOSPITAL_COMMUNITY)
Admission: RE | Disposition: A | Discharge status: Home Health | Payer: Self-pay | Source: Ambulatory Visit | Attending: Orthopedic Surgery

## 2015-07-04 ENCOUNTER — Encounter (HOSPITAL_COMMUNITY): Payer: Self-pay | Admitting: Critical Care Medicine

## 2015-07-04 ENCOUNTER — Inpatient Hospital Stay (HOSPITAL_COMMUNITY): Payer: 59 | Admitting: Critical Care Medicine

## 2015-07-04 ENCOUNTER — Inpatient Hospital Stay (HOSPITAL_COMMUNITY)
Admission: RE | Admit: 2015-07-04 | Discharge: 2015-07-08 | DRG: 504 | Disposition: A | Discharge status: Home Health | Payer: 59 | Source: Ambulatory Visit | Attending: Orthopedic Surgery | Admitting: Orthopedic Surgery

## 2015-07-04 DIAGNOSIS — F319 Bipolar disorder, unspecified: Secondary | ICD-10-CM | POA: Diagnosis present

## 2015-07-04 DIAGNOSIS — Z8249 Family history of ischemic heart disease and other diseases of the circulatory system: Secondary | ICD-10-CM | POA: Diagnosis not present

## 2015-07-04 DIAGNOSIS — I10 Essential (primary) hypertension: Secondary | ICD-10-CM | POA: Diagnosis present

## 2015-07-04 DIAGNOSIS — M869 Osteomyelitis, unspecified: Secondary | ICD-10-CM | POA: Diagnosis present

## 2015-07-04 DIAGNOSIS — L03031 Cellulitis of right toe: Secondary | ICD-10-CM | POA: Diagnosis present

## 2015-07-04 DIAGNOSIS — Y792 Prosthetic and other implants, materials and accessory orthopedic devices associated with adverse incidents: Secondary | ICD-10-CM | POA: Diagnosis present

## 2015-07-04 DIAGNOSIS — M86171 Other acute osteomyelitis, right ankle and foot: Secondary | ICD-10-CM | POA: Diagnosis present

## 2015-07-04 DIAGNOSIS — F419 Anxiety disorder, unspecified: Secondary | ICD-10-CM | POA: Diagnosis present

## 2015-07-04 DIAGNOSIS — L02611 Cutaneous abscess of right foot: Secondary | ICD-10-CM | POA: Diagnosis present

## 2015-07-04 DIAGNOSIS — Y834 Other reconstructive surgery as the cause of abnormal reaction of the patient, or of later complication, without mention of misadventure at the time of the procedure: Secondary | ICD-10-CM | POA: Diagnosis present

## 2015-07-04 DIAGNOSIS — Y838 Other surgical procedures as the cause of abnormal reaction of the patient, or of later complication, without mention of misadventure at the time of the procedure: Secondary | ICD-10-CM

## 2015-07-04 DIAGNOSIS — B9561 Methicillin susceptible Staphylococcus aureus infection as the cause of diseases classified elsewhere: Secondary | ICD-10-CM | POA: Diagnosis not present

## 2015-07-04 DIAGNOSIS — E785 Hyperlipidemia, unspecified: Secondary | ICD-10-CM | POA: Diagnosis present

## 2015-07-04 DIAGNOSIS — G4733 Obstructive sleep apnea (adult) (pediatric): Secondary | ICD-10-CM | POA: Diagnosis present

## 2015-07-04 DIAGNOSIS — Z9889 Other specified postprocedural states: Secondary | ICD-10-CM

## 2015-07-04 DIAGNOSIS — M797 Fibromyalgia: Secondary | ICD-10-CM | POA: Diagnosis present

## 2015-07-04 DIAGNOSIS — K219 Gastro-esophageal reflux disease without esophagitis: Secondary | ICD-10-CM | POA: Diagnosis present

## 2015-07-04 DIAGNOSIS — T8469XA Infection and inflammatory reaction due to internal fixation device of other site, initial encounter: Secondary | ICD-10-CM | POA: Diagnosis present

## 2015-07-04 DIAGNOSIS — Y92019 Unspecified place in single-family (private) house as the place of occurrence of the external cause: Secondary | ICD-10-CM

## 2015-07-04 DIAGNOSIS — A4901 Methicillin susceptible Staphylococcus aureus infection, unspecified site: Secondary | ICD-10-CM | POA: Insufficient documentation

## 2015-07-04 DIAGNOSIS — B9689 Other specified bacterial agents as the cause of diseases classified elsewhere: Secondary | ICD-10-CM

## 2015-07-04 DIAGNOSIS — T8469XD Infection and inflammatory reaction due to internal fixation device of other site, subsequent encounter: Secondary | ICD-10-CM | POA: Diagnosis not present

## 2015-07-04 HISTORY — DX: Hyperlipidemia, unspecified: E78.5

## 2015-07-04 HISTORY — DX: Family history of other specified conditions: Z84.89

## 2015-07-04 HISTORY — DX: Migraine, unspecified, not intractable, without status migrainosus: G43.909

## 2015-07-04 HISTORY — DX: Fibromyalgia: M79.7

## 2015-07-04 HISTORY — DX: Obstructive sleep apnea (adult) (pediatric): G47.33

## 2015-07-04 HISTORY — PX: INCISION AND DRAINAGE OF WOUND: SHX1803

## 2015-07-04 HISTORY — PX: IRRIGATION AND DEBRIDEMENT FOOT: SHX6602

## 2015-07-04 LAB — BASIC METABOLIC PANEL
Anion gap: 10 (ref 5–15)
BUN: 11 mg/dL (ref 6–20)
CALCIUM: 9.3 mg/dL (ref 8.9–10.3)
CO2: 23 mmol/L (ref 22–32)
CREATININE: 0.8 mg/dL (ref 0.44–1.00)
Chloride: 106 mmol/L (ref 101–111)
GFR calc Af Amer: >60 mL/min (ref >60)
Glucose, Bld: 110 mg/dL — ABNORMAL HIGH (ref 65–99)
POTASSIUM: 4.1 mmol/L (ref 3.5–5.1)
SODIUM: 139 mmol/L (ref 135–145)

## 2015-07-04 LAB — CBC
HEMATOCRIT: 38.9 % (ref 36.0–46.0)
Hemoglobin: 13.4 g/dL (ref 12.0–15.0)
MCH: 29.4 pg (ref 26.0–34.0)
MCHC: 34.4 g/dL (ref 30.0–36.0)
MCV: 85.3 fL (ref 78.0–100.0)
PLATELETS: 228 10*3/uL (ref 150–400)
RBC: 4.56 MIL/uL (ref 3.87–5.11)
RDW: 12.5 % (ref 11.5–15.5)
WBC: 7.4 10*3/uL (ref 4.0–10.5)

## 2015-07-04 SURGERY — IRRIGATION AND DEBRIDEMENT WOUND
Anesthesia: Monitor Anesthesia Care | Site: Toe | Laterality: Right

## 2015-07-04 MED ORDER — PROPOFOL 500 MG/50ML IV EMUL
INTRAVENOUS | Status: DC | PRN
  Administered 2015-07-04: 50 ug/kg/min via INTRAVENOUS

## 2015-07-04 MED ORDER — CHLORHEXIDINE GLUCONATE 4 % EX LIQD: 60.0000 mL | Freq: Once | CUTANEOUS | Status: DC

## 2015-07-04 MED ORDER — METOCLOPRAMIDE HCL 5 MG PO TABS: 5.0000 mg | ORAL_TABLET | Freq: Three times a day (TID) | ORAL | Status: DC | PRN

## 2015-07-04 MED ORDER — PROMETHAZINE HCL 25 MG/ML IJ SOLN: 6.2500 mg | INTRAMUSCULAR | Status: DC | PRN

## 2015-07-04 MED ORDER — ACETAMINOPHEN 650 MG RE SUPP: 650.0000 mg | Freq: Four times a day (QID) | RECTAL | Status: DC | PRN

## 2015-07-04 MED ORDER — ONDANSETRON HCL 4 MG/2ML IJ SOLN: 4.0000 mg | Freq: Four times a day (QID) | INTRAMUSCULAR | Status: DC | PRN

## 2015-07-04 MED ORDER — ONDANSETRON HCL 4 MG/2ML IJ SOLN
INTRAMUSCULAR | Status: DC | PRN
  Administered 2015-07-04: 4 mg via INTRAVENOUS

## 2015-07-04 MED ORDER — SODIUM CHLORIDE 0.9 % IV SOLN
INTRAVENOUS | Status: DC
  Administered 2015-07-04 – 2015-07-05 (×2): via INTRAVENOUS

## 2015-07-04 MED ORDER — CLONAZEPAM 1 MG PO TABS
1.0000 mg | ORAL_TABLET | Freq: Every day | ORAL | Status: DC
  Administered 2015-07-04 – 2015-07-07 (×4): 1 mg via ORAL
  Filled 2015-07-04 (×4): qty 1

## 2015-07-04 MED ORDER — CLONAZEPAM 0.5 MG PO TABS
0.5000 mg | ORAL_TABLET | Freq: Every day | ORAL | Status: DC
  Administered 2015-07-05 – 2015-07-08 (×5): 0.5 mg via ORAL
  Filled 2015-07-04 (×4): qty 1

## 2015-07-04 MED ORDER — SENNA 8.6 MG PO TABS
1.0000 | ORAL_TABLET | Freq: Two times a day (BID) | ORAL | Status: DC
  Administered 2015-07-04 – 2015-07-07 (×7): 8.6 mg via ORAL
  Filled 2015-07-04 (×9): qty 1

## 2015-07-04 MED ORDER — SODIUM CHLORIDE 0.9 % IV SOLN: INTRAVENOUS | Status: DC

## 2015-07-04 MED ORDER — MIDAZOLAM HCL 2 MG/2ML IJ SOLN
INTRAMUSCULAR | Status: AC
  Filled 2015-07-04: qty 2

## 2015-07-04 MED ORDER — ISOPROPYL ALCOHOL 70 % SOLN
Status: DC | PRN
  Administered 2015-07-04: 1 via TOPICAL

## 2015-07-04 MED ORDER — MIDAZOLAM HCL 5 MG/5ML IJ SOLN
INTRAMUSCULAR | Status: DC | PRN
  Administered 2015-07-04 (×2): 0.5 mg via INTRAVENOUS
  Administered 2015-07-04: 1 mg via INTRAVENOUS

## 2015-07-04 MED ORDER — LISINOPRIL 20 MG PO TABS
20.0000 mg | ORAL_TABLET | Freq: Every day | ORAL | Status: DC
  Administered 2015-07-04 – 2015-07-08 (×5): 20 mg via ORAL
  Filled 2015-07-04 (×5): qty 1

## 2015-07-04 MED ORDER — VANCOMYCIN HCL IN DEXTROSE 1-5 GM/200ML-% IV SOLN
1000.0000 mg | Freq: Three times a day (TID) | INTRAVENOUS | Status: DC
  Administered 2015-07-04 – 2015-07-06 (×5): 1000 mg via INTRAVENOUS
  Filled 2015-07-04 (×8): qty 200

## 2015-07-04 MED ORDER — AMPHETAMINE-DEXTROAMPHETAMINE 10 MG PO TABS
30.0000 mg | ORAL_TABLET | Freq: Three times a day (TID) | ORAL | Status: DC
  Administered 2015-07-05 – 2015-07-08 (×9): 30 mg via ORAL
  Filled 2015-07-04 (×8): qty 3
  Filled 2015-07-04: qty 1
  Filled 2015-07-04 (×2): qty 3
  Filled 2015-07-04: qty 1
  Filled 2015-07-04 (×9): qty 3

## 2015-07-04 MED ORDER — SUCCINYLCHOLINE CHLORIDE 20 MG/ML IJ SOLN
INTRAMUSCULAR | Status: AC
  Filled 2015-07-04: qty 1

## 2015-07-04 MED ORDER — PIPERACILLIN-TAZOBACTAM 3.375 G IVPB
3.3750 g | Freq: Three times a day (TID) | INTRAVENOUS | Status: DC
  Administered 2015-07-04: 3.375 g via INTRAVENOUS
  Filled 2015-07-04 (×3): qty 50

## 2015-07-04 MED ORDER — PROPOFOL 10 MG/ML IV BOLUS
INTRAVENOUS | Status: DC | PRN
  Administered 2015-07-04: 10 mg via INTRAVENOUS

## 2015-07-04 MED ORDER — MORPHINE SULFATE (PF) 2 MG/ML IV SOLN: 2.0000 mg | INTRAVENOUS | Status: DC | PRN

## 2015-07-04 MED ORDER — GENTAMICIN SULFATE 40 MG/ML IJ SOLN
INTRAMUSCULAR | Status: AC
  Filled 2015-07-04: qty 6

## 2015-07-04 MED ORDER — SIMVASTATIN 20 MG PO TABS
20.0000 mg | ORAL_TABLET | Freq: Every day | ORAL | Status: DC
  Administered 2015-07-04 – 2015-07-07 (×4): 20 mg via ORAL
  Filled 2015-07-04 (×4): qty 1

## 2015-07-04 MED ORDER — GENTAMICIN SULFATE 40 MG/ML IJ SOLN
INTRAMUSCULAR | Status: DC | PRN
  Administered 2015-07-04 (×2): 80 mg

## 2015-07-04 MED ORDER — DIPHENHYDRAMINE HCL 12.5 MG/5ML PO ELIX: 12.5000 mg | ORAL_SOLUTION | ORAL | Status: DC | PRN

## 2015-07-04 MED ORDER — DULOXETINE HCL 60 MG PO CPEP
120.0000 mg | ORAL_CAPSULE | Freq: Every evening | ORAL | Status: DC
  Administered 2015-07-04 – 2015-07-07 (×4): 120 mg via ORAL
  Filled 2015-07-04 (×4): qty 2

## 2015-07-04 MED ORDER — PANTOPRAZOLE SODIUM 40 MG PO TBEC
40.0000 mg | DELAYED_RELEASE_TABLET | Freq: Every day | ORAL | Status: DC
Start: 2015-07-05 — End: 2015-07-08
  Administered 2015-07-05 – 2015-07-08 (×4): 40 mg via ORAL
  Filled 2015-07-04 (×4): qty 1

## 2015-07-04 MED ORDER — SODIUM CHLORIDE 0.9 % IR SOLN
Status: DC | PRN
  Administered 2015-07-04: 3000 mL

## 2015-07-04 MED ORDER — FENTANYL CITRATE (PF) 250 MCG/5ML IJ SOLN
INTRAMUSCULAR | Status: AC
  Filled 2015-07-04: qty 5

## 2015-07-04 MED ORDER — ROCURONIUM BROMIDE 50 MG/5ML IV SOLN
INTRAVENOUS | Status: AC
  Filled 2015-07-04: qty 1

## 2015-07-04 MED ORDER — VANCOMYCIN HCL 500 MG IV SOLR
INTRAVENOUS | Status: AC
  Filled 2015-07-04: qty 500

## 2015-07-04 MED ORDER — ACETAMINOPHEN 325 MG PO TABS
650.0000 mg | ORAL_TABLET | Freq: Four times a day (QID) | ORAL | Status: DC | PRN
  Filled 2015-07-04: qty 2

## 2015-07-04 MED ORDER — OXYCODONE HCL 5 MG PO TABS
5.0000 mg | ORAL_TABLET | ORAL | Status: DC | PRN
  Administered 2015-07-04: 10 mg via ORAL
  Administered 2015-07-04: 5 mg via ORAL
  Administered 2015-07-05: 10 mg via ORAL
  Filled 2015-07-04: qty 2
  Filled 2015-07-04: qty 1
  Filled 2015-07-04 (×2): qty 2
  Filled 2015-07-04: qty 1

## 2015-07-04 MED ORDER — METOCLOPRAMIDE HCL 5 MG/ML IJ SOLN: 5.0000 mg | Freq: Three times a day (TID) | INTRAMUSCULAR | Status: DC | PRN

## 2015-07-04 MED ORDER — ARIPIPRAZOLE 5 MG PO TABS
7.5000 mg | ORAL_TABLET | Freq: Every day | ORAL | Status: DC
  Administered 2015-07-04 – 2015-07-07 (×4): 7.5 mg via ORAL
  Filled 2015-07-04 (×7): qty 2
  Filled 2015-07-04: qty 1
  Filled 2015-07-04: qty 2

## 2015-07-04 MED ORDER — FENTANYL CITRATE (PF) 100 MCG/2ML IJ SOLN: 25.0000 ug | INTRAMUSCULAR | Status: DC | PRN

## 2015-07-04 MED ORDER — TOPIRAMATE 25 MG PO TABS
25.0000 mg | ORAL_TABLET | Freq: Three times a day (TID) | ORAL | Status: DC
  Administered 2015-07-05 – 2015-07-08 (×9): 25 mg via ORAL
  Filled 2015-07-04 (×12): qty 1

## 2015-07-04 MED ORDER — VANCOMYCIN HCL IN DEXTROSE 1-5 GM/200ML-% IV SOLN
1000.0000 mg | INTRAVENOUS | Status: AC
  Administered 2015-07-04: 1000 mg via INTRAVENOUS
  Filled 2015-07-04: qty 200

## 2015-07-04 MED ORDER — VANCOMYCIN HCL 500 MG IV SOLR
INTRAVENOUS | Status: DC | PRN
  Administered 2015-07-04: 500 mg

## 2015-07-04 MED ORDER — LIDOCAINE HCL (CARDIAC) 20 MG/ML IV SOLN
INTRAVENOUS | Status: AC
  Filled 2015-07-04: qty 5

## 2015-07-04 MED ORDER — VANCOMYCIN HCL 1000 MG IV SOLR
INTRAVENOUS | Status: AC
  Filled 2015-07-04: qty 1000

## 2015-07-04 MED ORDER — FENTANYL CITRATE (PF) 100 MCG/2ML IJ SOLN
INTRAMUSCULAR | Status: DC | PRN
  Administered 2015-07-04 (×3): 25 ug via INTRAVENOUS

## 2015-07-04 MED ORDER — LACTATED RINGERS IV SOLN
INTRAVENOUS | Status: DC
  Administered 2015-07-04 (×3): via INTRAVENOUS

## 2015-07-04 MED ORDER — CEFTRIAXONE SODIUM 2 G IJ SOLR
2.0000 g | INTRAMUSCULAR | Status: DC
  Administered 2015-07-04 – 2015-07-07 (×4): 2 g via INTRAVENOUS
  Filled 2015-07-04 (×5): qty 2

## 2015-07-04 MED ORDER — 0.9 % SODIUM CHLORIDE (POUR BTL) OPTIME
TOPICAL | Status: DC | PRN
  Administered 2015-07-04: 1000 mL

## 2015-07-04 MED ORDER — PROPOFOL 10 MG/ML IV BOLUS
INTRAVENOUS | Status: AC
  Filled 2015-07-04: qty 20

## 2015-07-04 MED ORDER — ONDANSETRON HCL 4 MG PO TABS: 4.0000 mg | ORAL_TABLET | Freq: Four times a day (QID) | ORAL | Status: DC | PRN

## 2015-07-04 MED ORDER — DOCUSATE SODIUM 100 MG PO CAPS
100.0000 mg | ORAL_CAPSULE | Freq: Two times a day (BID) | ORAL | Status: DC
  Administered 2015-07-04 – 2015-07-07 (×7): 100 mg via ORAL
  Filled 2015-07-04 (×8): qty 1

## 2015-07-04 SURGICAL SUPPLY — 48 items
BNDG COHESIVE 4X5 TAN STRL (GAUZE/BANDAGES/DRESSINGS) ×2 IMPLANT
BNDG COHESIVE 6X5 TAN STRL LF (GAUZE/BANDAGES/DRESSINGS) IMPLANT
BNDG ESMARK 4X9 LF (GAUZE/BANDAGES/DRESSINGS) ×2 IMPLANT
CANISTER SUCT 3000ML PPV (MISCELLANEOUS) ×2 IMPLANT
CHLORAPREP W/TINT 26ML (MISCELLANEOUS) ×2 IMPLANT
CONT SPEC 4OZ CLIKSEAL STRL BL (MISCELLANEOUS) ×2 IMPLANT
CUFF TOURNIQUET SINGLE 34IN LL (TOURNIQUET CUFF) IMPLANT
CUFF TOURNIQUET SINGLE 44IN (TOURNIQUET CUFF) IMPLANT
DRAPE U-SHAPE 47X51 STRL (DRAPES) ×2 IMPLANT
DRSG ADAPTIC 3X8 NADH LF (GAUZE/BANDAGES/DRESSINGS) IMPLANT
DRSG MEPITEL 4X7.2 (GAUZE/BANDAGES/DRESSINGS) ×2 IMPLANT
ELECT REM PT RETURN 9FT ADLT (ELECTROSURGICAL) ×2
ELECTRODE REM PT RTRN 9FT ADLT (ELECTROSURGICAL) ×1 IMPLANT
GAUZE SPONGE 4X4 12PLY STRL (GAUZE/BANDAGES/DRESSINGS) ×2 IMPLANT
GLOVE BIO SURGEON STRL SZ8 (GLOVE) ×6 IMPLANT
GLOVE BIOGEL PI IND STRL 8 (GLOVE) ×2 IMPLANT
GLOVE BIOGEL PI IND STRL 8.5 (GLOVE) ×1 IMPLANT
GLOVE BIOGEL PI INDICATOR 8 (GLOVE) ×2
GLOVE BIOGEL PI INDICATOR 8.5 (GLOVE) ×1
GLOVE ECLIPSE 7.5 STRL STRAW (GLOVE) ×2 IMPLANT
GOWN STRL REUS W/ TWL LRG LVL3 (GOWN DISPOSABLE) IMPLANT
GOWN STRL REUS W/ TWL XL LVL3 (GOWN DISPOSABLE) ×3 IMPLANT
GOWN STRL REUS W/TWL LRG LVL3 (GOWN DISPOSABLE)
GOWN STRL REUS W/TWL XL LVL3 (GOWN DISPOSABLE) ×3
KIT BASIN OR (CUSTOM PROCEDURE TRAY) ×2 IMPLANT
KIT ROOM TURNOVER OR (KITS) ×2 IMPLANT
KIT STIMULAN RAPID CURE 5CC (Orthopedic Implant) ×2 IMPLANT
NS IRRIG 1000ML POUR BTL (IV SOLUTION) ×2 IMPLANT
PACK ORTHO EXTREMITY (CUSTOM PROCEDURE TRAY) ×2 IMPLANT
PAD ABD 8X10 STRL (GAUZE/BANDAGES/DRESSINGS) ×2 IMPLANT
PAD ARMBOARD 7.5X6 YLW CONV (MISCELLANEOUS) ×2 IMPLANT
PAD CAST 4YDX4 CTTN HI CHSV (CAST SUPPLIES) ×1 IMPLANT
PADDING CAST COTTON 4X4 STRL (CAST SUPPLIES) ×1
SET CYSTO W/LG BORE CLAMP LF (SET/KITS/TRAYS/PACK) ×2 IMPLANT
SPONGE GAUZE 4X4 12PLY STER LF (GAUZE/BANDAGES/DRESSINGS) ×2 IMPLANT
SPONGE LAP 18X18 X RAY DECT (DISPOSABLE) IMPLANT
SUCTION FRAZIER HANDLE 10FR (MISCELLANEOUS) ×1
SUCTION TUBE FRAZIER 10FR DISP (MISCELLANEOUS) ×1 IMPLANT
SUT ETHILON 2 0 FS 18 (SUTURE) ×2 IMPLANT
SUT ETHILON 3 0 PS 1 (SUTURE) IMPLANT
SWAB COLLECTION DEVICE MRSA (MISCELLANEOUS) ×2 IMPLANT
SYRINGE 20CC LL (MISCELLANEOUS) ×2 IMPLANT
TOWEL OR 17X24 6PK STRL BLUE (TOWEL DISPOSABLE) ×2 IMPLANT
TOWEL OR 17X26 10 PK STRL BLUE (TOWEL DISPOSABLE) IMPLANT
TUBE ANAEROBIC SPECIMEN COL (MISCELLANEOUS) ×2 IMPLANT
TUBE CONNECTING 12X1/4 (SUCTIONS) ×2 IMPLANT
UNDERPAD 30X30 INCONTINENT (UNDERPADS AND DIAPERS) ×2 IMPLANT
WATER STERILE IRR 1000ML POUR (IV SOLUTION) IMPLANT

## 2015-07-04 NOTE — Anesthesia Preprocedure Evaluation (Addendum)
Anesthesia Evaluation  Patient identified by MRN, date of birth, ID band Patient awake    Reviewed: Allergy & Precautions, NPO status , Patient's Chart, lab work & pertinent test results  Airway Mallampati: II  TM Distance: >3 FB Neck ROM: Full    Dental   Pulmonary sleep apnea ,    breath sounds clear to auscultation       Cardiovascular hypertension,  Rhythm:Regular Rate:Normal     Neuro/Psych    GI/Hepatic Neg liver ROS, GERD  ,  Endo/Other  negative endocrine ROS  Renal/GU negative Renal ROS     Musculoskeletal   Abdominal   Peds  Hematology   Anesthesia Other Findings   Reproductive/Obstetrics                           Anesthesia Physical Anesthesia Plan  ASA: III  Anesthesia Plan: MAC   Post-op Pain Management: MAC Combined w/ Regional for Post-op pain   Induction:   Airway Management Planned: Simple Face Mask  Additional Equipment:   Intra-op Plan:   Post-operative Plan:   Informed Consent: I have reviewed the patients History and Physical, chart, labs and discussed the procedure including the risks, benefits and alternatives for the proposed anesthesia with the patient or authorized representative who has indicated his/her understanding and acceptance.   Dental advisory given  Plan Discussed with: Anesthesiologist, CRNA and Surgeon  Anesthesia Plan Comments:        Anesthesia Quick Evaluation

## 2015-07-04 NOTE — Progress Notes (Signed)
Per CRNA, pt was very sleepy preop. She is easily arousable in PACU and says she took her Klonopin this am, so no dose given in PACU.

## 2015-07-04 NOTE — Progress Notes (Signed)
Pharmacy Antibiotic Note  Whitney Lewis is a 55 y.o. female admitted on 07/04/2015 with right forefoot infection.  She has a history of right forefront reconstruction with superficial abscess dorsal to the second toe.  Pharmacy has been consulted for vancomycin and Zosyn dosing for right foot abscess and osteomyelitis.  She is s/p I&D and has received vancomycin 1gm IV at 1030.   Plan: - Vanc 1gm IV Q8H - Zosyn 3.375gm IV Q8H, 4 hr infusion - Monitor renal fxn, clinical progress, vanc trough at Css   Height: 5\' 6"  (167.6 cm) Weight: 192 lb (87.091 kg) IBW/kg (Calculated) : 59.3  Temp (24hrs), Avg:97.8 F (36.6 C), Min:97.6 F (36.4 C), Max:98 F (36.7 C)   Recent Labs Lab 07/04/15 0844  WBC 7.4  CREATININE 0.80    Estimated Creatinine Clearance: 87.3 mL/min (by C-G formula based on Cr of 0.8).    Allergies  Allergen Reactions  . Codeine Nausea Only  . Diclofenac Sodium Other (See Comments)    Antimicrobials this admission: Vanc 2/9 >> Zosyn 2/9 >>  Dose adjustments this admission: N/A  Microbiology results: 2/9 tissue cx - 2/9 wound cx -     Lyndsay Talamante D. Mina Marble, PharmD, BCPS Pager:  [phone] 07/04/2015, 1:38 PM

## 2015-07-04 NOTE — Anesthesia Postprocedure Evaluation (Signed)
Anesthesia Post Note  Patient: Whitney Lewis  Procedure(s) Performed: Procedure(s) (LRB): RIGHT SECOND TOE AND METATARSAL DEBRIDEMENT  (Right)  Patient location during evaluation: PACU Anesthesia Type: MAC Level of consciousness: awake Vital Signs Assessment: post-procedure vital signs reviewed and stable Respiratory status: spontaneous breathing Cardiovascular status: stable Anesthetic complications: no    Last Vitals:  Filed Vitals:   07/04/15 1224 07/04/15 1230  BP: 130/72   Pulse:  75  Temp:  36.4 C  Resp:  21    Last Pain:  Filed Vitals:   07/04/15 1236  PainSc: 0-No pain                 EDWARDS,Norah Devin

## 2015-07-04 NOTE — Anesthesia Procedure Notes (Addendum)
Procedure Name: MAC Date/Time: 07/04/2015 9:58 AM Performed by: Merrilyn Puma B Pre-anesthesia Checklist: Patient identified, Emergency Drugs available, Suction available, Patient being monitored and Timeout performed Patient Re-evaluated:Patient Re-evaluated prior to inductionOxygen Delivery Method: Simple face mask Placement Confirmation: positive ETCO2 and breath sounds checked- equal and bilateral Dental Injury: Teeth and Oropharynx as per pre-operative assessment    Anesthesia Regional Block:  Ankle block  Pre-Anesthetic Checklist: ,, timeout performed, Correct Patient, Correct Site, Correct Procedure, Correct Position, site marked, risks and benefits discussed, surgical consent, pre-op evaluation,  At surgeon's request and post-op pain management  Laterality: Right  Prep: chloraprep       Needles:       Needle Gauge: 25 and 25 G    Additional Needles: Ankle block Narrative:  Start time: 07/04/2015 9:25 AM End time: 07/04/2015 9:40 AM Injection made incrementally with aspirations every 5 mL.  Performed by: Personally  Anesthesiologist: Finis Bud

## 2015-07-04 NOTE — Transfer of Care (Signed)
Immediate Anesthesia Transfer of Care Note  Patient: Whitney W Lewis  Procedure(s) Performed: Procedure(s): RIGHT SECOND TOE AND METATARSAL DEBRIDEMENT  (Right)  Patient Location: PACU  Anesthesia Type:General  Level of Consciousness: awake, alert  and oriented  Airway & Oxygen Therapy: Patient Spontanous Breathing and Patient connected to nasal cannula oxygen  Post-op Assessment: Report given to RN, Post -op Vital signs reviewed and stable and Patient moving all extremities X 4  Post vital signs: Reviewed and stable  Last Vitals:  Filed Vitals:   07/04/15 0827  BP: 119/78  Pulse: 86  Temp: 36.4 C  Resp: 20    Complications: No apparent anesthesia complications

## 2015-07-04 NOTE — Brief Op Note (Signed)
07/04/2015  11:03 AM  PATIENT:  Whitney Lewis  55 y.o. 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  Irrigation and debridement of right foot deep abscess 2.  Excision of right 2nd MT head 3.  Removal of deep implant right foot 4.  Excision of right 2nd toe base of proximal phalanx 5.  Insertion of antibiotic beads   SURGEON:  [name], MD  ASSISTANT: Mechele Claude, PA-C  ANESTHESIA:  MAC, regional  EBL:  minimal   TOURNIQUET:  approx 20 min with ankle esmarch  COMPLICATIONS:  None apparent  DISPOSITION:  Extubated, awake and stable to recovery.  DICTATION ID:  [id number]

## 2015-07-04 NOTE — H&P (Signed)
Whitney Lewis is an 55 y.o. female.   Chief Complaint: right forefoot infection HPI: 55 y/o female with h/o right forefoot reconstruction c/b superficial abscess dorsal to the 2nd toe in the early post op period.  Recently she has had increased pain and swelling at the 2nd toe incision.  Radiographs are suggestive of deep infection at the head of the 2nd metatarsal and base of the proximal phalanx.  She presents now for operative treatment of this deep infection.  Past Medical History  Diagnosis Date  . Hypertension   . Sleep apnea     wears mouth piece, no CPAP  . Complication of anesthesia     hard to wake up  . Depression   . Bipolar disorder ([place])   . Anxiety   . GERD (gastroesophageal reflux disease)   . Headache   . Arthritis     feet, knees    Past Surgical History  Procedure Laterality Date  . Joint replacement Left   . Abdominal hysterectomy    . Foot surgery Right   . Cesarean section    . Weil osteotomy Left 06/06/2015    Procedure: LEFT TWO AND THREE METATARSAL WEIL OSTEOTOMY ;  Surgeon: [name], MD;  Location: [facility];  Service: Orthopedics;  Laterality: Left;  . Hammer toe surgery Left 06/06/2015    Procedure: LEFT TWO-THREE  HAMMER TOE CORRECTION ;  Surgeon: [name], MD;  Location: [facility];  Service: Orthopedics;  Laterality: Left;    Family History  Problem Relation Age of Onset  . Heart disease Mother    Social History:  reports that she has never smoked. She has never used smokeless tobacco. She reports that she drinks alcohol. She reports that she does not use illicit drugs.  Allergies:  Allergies  Allergen Reactions  . Codeine Nausea Only  . Diclofenac Sodium Other (See Comments)    No prescriptions prior to admission    No results found for this or any previous visit (from the past 48 hour(s)). No results found.  ROS  No recent f/c/n/v/wt loss.    There were no vitals taken for this  visit. Physical Exam  wn wd woman in nad.  A and O x 4.  Mood and affect normal.  EOMI.  resp unlabored.  R forefoot with healthy skin.  No ulcers or drainage.  5/5 strength in PF and DF of the ankle and toes.  2nd toe is swollen.  TTP but no fluctuance.  Brisk cap refill a tthe 2nd toe.  No lymphadenopathy.  Assessment/Plan R 2nd toe deep infection and osteomyelitis - to OR for I and D and abx beads.  Cultures before abx.  Plan admission for IV abx.  The risks and benefits of the alternative treatment options have been discussed in detail.  The patient wishes to proceed with surgery and specifically understands risks of bleeding, infection, nerve damage, blood clots, need for additional surgery, amputation and death.   [name], MD 07/07/2015, 7:24 AM

## 2015-07-04 NOTE — Op Note (Signed)
NAME:  Whitney Lewis, Whitney Lewis            ACCOUNT NO.:  [id number]  MEDICAL RECORD NO.:  [id number]  LOCATION:  [facility]                         FACILITY:  [facility]  PHYSICIAN:  [name], MD        DATE OF BIRTH:  [birth year]  DATE OF PROCEDURE:  07/04/2015 DATE OF DISCHARGE:                              OPERATIVE REPORT   PREOPERATIVE DIAGNOSES: 1. Right second metatarsal head and second toe proximal phalanx     osteomyelitis. 2. Retained hardware, right second metatarsal. 3. Right forefoot abscess.  POSTOPERATIVE DIAGNOSES: 1. Right second metatarsal head and second toe proximal phalanx     osteomyelitis. 2. Retained hardware, right second metatarsal. 3. Right forefoot abscess.  PROCEDURE: 1. Irrigation and excisional debridement of right foot deep abscess. 2. Excision of right second metatarsal head. 3. Removal of deep implant, right foot. 4. Excision of right second toe proximal phalanx base. 5. Insertion of antibiotic beads into the right foot abscess.  SURGEON:  [name], MD.  ASSISTANT:  Mechele Claude, PA-C.  ANESTHESIA:  MAC, regional.  ESTIMATED BLOOD LOSS:  Minimal.  TOURNIQUET TIME:  Approximately 20 minutes with an ankle Esmarch.  COMPLICATIONS:  None apparent.  DISPOSITION:  Extubated, awake, and stable to recovery.  INDICATIONS FOR PROCEDURE:  The patient is a 55 year old woman who is now several months status post right forefoot reconstruction.  Her early postoperative period was complicated by a superficial infection of her second MTP joint surgical wound.  She underwent I and D and oral antibiotics at that time and the infection appeared to have resolved. Last week, she had recurrent pain and swelling at that operative site. Radiographs reveal what appears to be osteomyelitis of the head of the metatarsal and base of the second toe proximal phalanx.  She presents today for I and D and removal of hardware and application of antibiotic beads.  She is not  currently on any antibiotics. She understands the risks and benefits, the alternative treatment options, and elects surgical treatment.  She specifically understands the risks of bleeding, infection, nerve damage, blood clots, need for additional surgery, continued pain, recurrent infection, amputation, and death.  PROCEDURE IN DETAIL:  After preoperative consent was obtained and the correct operative site was identified, the patient was brought to the operating room and placed supine on the operating table.  Preoperative antibiotics were held.  Surgical time-out was taken.  The right lower extremity was prepped and draped in standard sterile fashion.  A longitudinal incision was then made over the second toe MTP joint. Sharp dissection was carried down through the skin and subcutaneous tissue.  At this point, an abscess was encountered adjacent to the MTP joint.  Swabs were obtained for culture.  The joint was then opened completely.  The screw was identified and was removed in its entirety. Rongeurs and curettes were used to remove all compromised bone from the head of the metatarsal and the base of the second toe proximal phalanx. The deep soft tissue was obtained as a specimen and sent to Microbiology for aerobic and anaerobic culture.  Wound was curetted and soft tissue excised with a scalpel, rongeur, and curette.  Wound was then irrigated copiously with 3  L of normal saline.  Again, excisional debridement was performed with the curette and rongeur.  The wound was then carefully dried and antibiotic beads (Stimulan) mixed with gentamicin and vancomycin were placed in the joint.  Wound was closed with 2-0 nylon horizontal mattress sutures.  Sterile dressings were applied followed by a compression wrap.  Tourniquet was released after application of the dressings at approximately 20 minutes.  The patient was awakened from anesthesia and transported to the recovery room in stable  condition.  FOLLOWUP PLAN:  The patient will be admitted to the hospital and started on IV vancomycin and Zosyn.  She will be weightbearing as tolerated. She will have Infectious Disease consultation to initiate antibiotics based on the culture results.  Mechele Claude, PA-C, was present and scrubbed for the duration of the case.  His assistance was essential in positioning the patient, prepping and draping, gaining and maintaining exposure, performing the operation, closing and dressing the wounds.     [name], MD     JH/MEDQ  D:  07/04/2015  T:  07/04/2015  Job:  [id number]

## 2015-07-04 NOTE — Care Management (Signed)
Utilization review completed. Mintie Witherington, RN Case Manager [phone]. 

## 2015-07-04 NOTE — Consult Note (Addendum)
[facility] for Infectious Disease  Total days of antibiotics 1        Day 1 vanco               Reason for Consult: foot osteomyelitis    Referring Physician: hewitt  Active Problems:   Foot osteomyelitis, right (H[facility])    HPI: Whitney Lewis is a 55 y.o. female  With history of recent foot reconstruction surgery roughly 4 months ago. Shortly after her surgery, roughly 2-3 wk, she developed a superficial skin infection to dorsum of foot near 2nd MTP. She underwent debridement and oral abtx through dr. Nona Dell office and improved. Most recently, she reports having more pain to 2nd toe of right foot. Xray was taken that suggested osteomyelitis. She was admitted for I x D, and hardware/screw removal. Dr. Doran Durand found small pocket of purulence for which he sent OR cultures. He felt the infection just localized to 2nd phalange and MT head. She has screw to 3rd and 4th MT head of right foot that were not involved in this infection. She was started on vanco and piptazo. He has asked for ID consultation for osteomyelitis in hopes of saving foot rather than ray amputation  Past Medical History  Diagnosis Date  . Hypertension   . Sleep apnea     wears mouth piece, no CPAP  . Complication of anesthesia     hard to wake up  . Depression   . Bipolar disorder ([place])   . Anxiety   . GERD (gastroesophageal reflux disease)   . Headache   . Arthritis     feet, knees    Allergies:  Allergies  Allergen Reactions  . Codeine Nausea Only  . Diclofenac Sodium Other (See Comments)    MEDICATIONS: . amphetamine-dextroamphetamine  30 mg Oral TID  . ARIPiprazole  7.5 mg Oral QHS  . [START ON 07/05/2015] clonazePAM  0.5 mg Oral Daily  . clonazePAM  1 mg Oral QHS  . docusate sodium  100 mg Oral BID  . DULoxetine  120 mg Oral QPM  . lisinopril  20 mg Oral Daily  . [START ON 07/05/2015] pantoprazole  40 mg Oral Daily  . piperacillin-tazobactam (ZOSYN)  IV  3.375 g Intravenous 3 times per day    . senna  1 tablet Oral BID  . simvastatin  20 mg Oral QHS  . topiramate  25 mg Oral TID  . vancomycin  1,000 mg Intravenous Q8H    Social History  Substance Use Topics  . Smoking status: Never Smoker   . Smokeless tobacco: Never Used  . Alcohol Use: Yes     Comment: 2-3/ week    Family History  Problem Relation Age of Onset  . Heart disease Mother    Review of Systems  Constitutional: Negative for fever, chills, diaphoresis, activity change, appetite change, fatigue and unexpected weight change.  HENT: Negative for congestion, sore throat, rhinorrhea, sneezing, trouble swallowing and sinus pressure.  Eyes: Negative for photophobia and visual disturbance.  Respiratory: Negative for cough, chest tightness, shortness of breath, wheezing and stridor.  Cardiovascular: Negative for chest pain, palpitations and leg swelling.  Gastrointestinal: Negative for nausea, vomiting, abdominal pain, diarrhea, constipation, blood in stool, abdominal distention and anal bleeding.  Genitourinary: Negative for dysuria, hematuria, flank pain and difficulty urinating.  Musculoskeletal: Positive foot pain. Negative for myalgias, back pain, joint swelling, arthralgias and gait problem.  Skin: Negative for color change, pallor, rash and wound.  Neurological: Negative for  dizziness, tremors, weakness and light-headedness.  Hematological: Negative for adenopathy. Does not bruise/bleed easily.  Psychiatric/Behavioral: Negative for behavioral problems, confusion, sleep disturbance, dysphoric mood, decreased concentration and agitation.     OBJECTIVE: Temp:  [97.6 F (36.4 C)-98 F (36.7 C)] 98 F (36.7 C) (02/09 1258) Pulse Rate:  [74-86] 82 (02/09 1258) Resp:  [14-21] 18 (02/09 1258) BP: (112-130)/(67-78) 112/76 mmHg (02/09 1258) SpO2:  [96 %-100 %] 100 % (02/09 1258) Weight:  [192 lb (87.091 kg)] 192 lb (87.091 kg) (02/09 0827) Physical Exam  Constitutional:  oriented to person, place, and time.  appears well-developed and well-nourished. No distress. Easily awakens HENT: [place]/AT, PERRLA, no scleral icterus Mouth/Throat: Oropharynx is clear and moist. No oropharyngeal exudate.  Cardiovascular: Normal rate, regular rhythm and normal heart sounds. Exam reveals no gallop and no friction rub.  No murmur heard.  Pulmonary/Chest: Effort normal and breath sounds normal. No respiratory distress.  has no wheezes.  Neck = supple, no nuchal rigidity Abdominal: Soft. Bowel sounds are normal.  exhibits no distension. There is no tenderness.  Lymphadenopathy: no cervical adenopathy. No axillary adenopathy Ext: right foot is wrapped, easily moves all toes Neurological: alert and oriented to person, place, and time.  Skin: Skin is warm and dry. No rash noted. No erythema.  Psychiatric: a normal mood and affect.  behavior is normal.   LABS: Results for orders placed or performed during the hospital encounter of 07/04/15 (from the past 48 hour(s))  Basic metabolic panel     Status: Abnormal   Collection Time: 07/04/15  8:44 AM  Result Value Ref Range   Sodium 139 135 - 145 mmol/L   Potassium 4.1 3.5 - 5.1 mmol/L   Chloride 106 101 - 111 mmol/L   CO2 23 22 - 32 mmol/L   Glucose, Bld 110 (H) 65 - 99 mg/dL   BUN 11 6 - 20 mg/dL   Creatinine, Ser 0.80 0.44 - 1.00 mg/dL   Calcium 9.3 8.9 - 10.3 mg/dL   GFR calc non Af Amer >60 >60 mL/min   GFR calc Af Amer >60 >60 mL/min    Comment: (NOTE) The eGFR has been calculated using the CKD EPI equation. This calculation has not been validated in all clinical situations. eGFR's persistently <60 mL/min signify possible Chronic Kidney Disease.    Anion gap 10 5 - 15  CBC     Status: None   Collection Time: 07/04/15  8:44 AM  Result Value Ref Range   WBC 7.4 4.0 - 10.5 K/uL   RBC 4.56 3.87 - 5.11 MIL/uL   Hemoglobin 13.4 12.0 - 15.0 g/dL   HCT 38.9 36.0 - 46.0 %   MCV 85.3 78.0 - 100.0 fL   MCH 29.4 26.0 - 34.0 pg   MCHC 34.4 30.0 - 36.0 g/dL   RDW  12.5 11.5 - 15.5 %   Platelets 228 150 - 400 K/uL    MICRO: 2/9 tissue cx pending 2/9 wound cx pending  Assessment/Plan:  55yo F with history of right foot reconstruction that has now developed to having osteomyelitis to 2nd MT head and proximal phalynx s/p debridement  - will check sed rate, crp - will place order to get picc line - continue on vancomycin plus ceftriaxone - will d/c piptazo - await culture results to narrow spectrum - we will plan on 6 wk of IV abtx, awaiting picc line placement  - for health maintenance - will check hiv and hep c ab.  Elzie Rings Baxter Flattery MD  [facility] [phone]

## 2015-07-05 ENCOUNTER — Encounter (HOSPITAL_COMMUNITY): Payer: Self-pay | Admitting: Orthopedic Surgery

## 2015-07-05 LAB — C-REACTIVE PROTEIN: CRP: 2.3 mg/dL — AB (ref <1.0)

## 2015-07-05 LAB — SEDIMENTATION RATE: Sed Rate: 7 mm/hr (ref 0–22)

## 2015-07-05 LAB — VANCOMYCIN, TROUGH: Vancomycin Tr: 58 ug/mL (ref 10.0–20.0)

## 2015-07-05 LAB — HIV ANTIBODY (ROUTINE TESTING W REFLEX): HIV SCREEN 4TH GENERATION: NONREACTIVE

## 2015-07-05 MED ORDER — SENNA 8.6 MG PO TABS: 2.0000 | ORAL_TABLET | Freq: Two times a day (BID) | ORAL | Status: DC

## 2015-07-05 MED ORDER — DOCUSATE SODIUM 100 MG PO CAPS: 100.0000 mg | ORAL_CAPSULE | Freq: Two times a day (BID) | ORAL | Status: DC

## 2015-07-05 MED ORDER — SODIUM CHLORIDE 0.9% FLUSH
10.0000 mL | INTRAVENOUS | Status: DC | PRN
  Administered 2015-07-08: 10 mL
  Filled 2015-07-05: qty 40

## 2015-07-05 MED ORDER — ONDANSETRON HCL 4 MG PO TABS: 4.0000 mg | ORAL_TABLET | Freq: Every day | ORAL | Status: DC | PRN

## 2015-07-05 MED ORDER — OXYCODONE HCL 5 MG PO TABS: 5.0000 mg | ORAL_TABLET | ORAL | Status: DC | PRN

## 2015-07-05 NOTE — Progress Notes (Signed)
A vancomycin trough was ordered for tonight. It resulted at 37mcg/mL- however upon conversation with RN, dose was hung prior to level being drawn.  Plan: -continue vancomycin 1g IV q8h -VT at 0930 on 2/11  Nica Friske D. Staci Carver, PharmD, BCPS Clinical Pharmacist Pager: [phone] 07/05/2015 8:21 PM

## 2015-07-05 NOTE — Progress Notes (Signed)
Subjective: 1 Day Post-Op Procedure(s) (LRB): IRRIGATION AND DEBRIDEMENT OF RIGHT FOOT DEEP ABSCESS; EXCISION OF RIGHT SECOND METATARSAL HEAD; REMOVAL OF DEEP IMPLANT RIGHT FOOT; EXCISION OF RIGHT SECOND TOE PROXIMAL PHALANX BASE; INSERTION OF ANTIBIOTIC BEADS INTO RIGHT FOOT ABSCESS (Right)  Patient reports pain as mild to moderate.  Denies fever, chills, N/V.  Tolerating POs well.  Denies BM, however admits to flatulence.  States that her foot feels better after surgery.  Objective:   VITALS:  Temp:  [97.6 F (36.4 C)-98.2 F (36.8 C)] 97.8 F (36.6 C) (02/10 0426) Pulse Rate:  [74-86] 80 (02/10 0426) Resp:  [14-21] 16 (02/10 0426) BP: (101-130)/(61-78) 102/61 mmHg (02/10 0426) SpO2:  [95 %-100 %] 97 % (02/10 0426) Weight:  [87.091 kg (192 lb)] 87.091 kg (192 lb) (02/09 0827)  General: WDWN patient in NAD. Psych:  Appropriate mood and affect. Neuro:  A&O x 3, Moving all extremities, sensation intact to light touch HEENT:  EOMs intact Chest:  Even non-labored respirations Skin: Dressing C/D/I, no rashes or lesions Extremities: warm/dry, no visible edema, erythmea, or echymosis.  No lymphadenopathy. Pulses: Popliteus 2+ MSK:  ROM: EHL/FHL intact, MMT: patient is able to perform quad set, (-) Homan's   LABS  Recent Labs  07/04/15 0844  HGB 13.4  WBC 7.4  PLT 228    Recent Labs  07/04/15 0844  NA 139  K 4.1  CL 106  CO2 23  BUN 11  CREATININE 0.80  GLUCOSE 110*   No results for input(s): LABPT, INR in the last 72 hours.   Assessment/Plan: 1 Day Post-Op Procedure(s) (LRB): IRRIGATION AND DEBRIDEMENT OF RIGHT FOOT DEEP ABSCESS; EXCISION OF RIGHT SECOND METATARSAL HEAD; REMOVAL OF DEEP IMPLANT RIGHT FOOT; EXCISION OF RIGHT SECOND TOE PROXIMAL PHALANX BASE; INSERTION OF ANTIBIOTIC BEADS INTO RIGHT FOOT ABSCESS (Right)  WBAT in post-op shoe RLE Awaiting cultures and PICC line placement. Plan for D/C home with 6 weeks ABX, to be determined via ID, appreciate ID's  assistance.   Mechele Claude, PA-C, [employer] Office:  [phone]

## 2015-07-05 NOTE — Care Management Note (Signed)
Case Management Note  Patient Details  Name: [place] W Bixler MRN: [id number] Date of Birth: 05-25-1961  Subjective/Objective:            Admitted with right foot osteomyelitis, s/p deep adscess I and D        Action/Plan: Spoke with patient and her husband about home IV antibiotics. They are both willing to learn to administer IV antibiotics. They selected [facility] [employer]. Audley Hose at [facility] and set up home RN and IV antibiotics. Will continue to follow for discharge needs.  Expected Discharge Date:                  Expected Discharge Plan:  [place]  In-House Referral:  NA  Discharge planning Services  CM Consult  Post Acute Care Choice:  Home Health Choice offered to:  Patient  DME Arranged:  N/A DME Agency:  NA  HH Arranged:  RN, IV Antibiotics HH Agency:  [facility]  Status of Service:  In process, will continue to follow  Medicare Important Message Given:    Date Medicare IM Given:    Medicare IM give by:    Date Additional Medicare IM Given:    Additional Medicare Important Message give by:     If discussed at [place] of Stay Meetings, dates discussed:    Additional Comments:  Nila Nephew, RN 07/05/2015, 10:33 AM

## 2015-07-05 NOTE — Discharge Summary (Signed)
Physician Discharge Summary  Patient ID: Whitney Lewis MRN: [id number] DOB/AGE: 01/29/1961 55 y.o.  Admit date: 07/04/2015 Discharge date: 07/08/2015 Admission Diagnoses: R foot infected hardware, osteomyelitis, cellulitis; HTN; bipolar disorder; GERD; anxiety; obstructive sleep apnea; fibromyalgia  Discharge Diagnoses:  Active Problems:   Foot osteomyelitis, right ([place]) same as above  Discharged Condition: stable  Hospital Course: Patient presented to [facility] on 07/04/2015 for elective I&D for R foot for infected hardware, osteomyelitis, and cellulitis performed by Dr. [name].  The patient tolerated the procedure well without complication.  The patient was then admitted to the hospital and ID was consulted.  A PICC line was placed and the patient is to be D/C'd home on 6 weeks of IV Ancef.  The patient is to be D/C'd home on 07/08/2015 with home health nurse.  The patient tolerated her stay well without complication.  Consults: ID  Significant Diagnostic Studies: labs: intra-op cultures  Treatments: IV hydration, antibiotics: vancomycin and Zosyn, analgesia: acetaminophen, acetaminophen w/ codeine and Morphine, cardiac meds: lisinopril (Prinivil) and surgery: as stated above.  Discharge Exam: Blood pressure 102/61, pulse 80, temperature 97.8 F (36.6 C), temperature source Oral, resp. rate 16, height 5\' 6"  (1.676 m), weight 87.091 kg (192 lb), SpO2 97 %. General: WDWN patient in NAD. Psych:  Appropriate mood and affect. Neuro:  A&O x 3, Moving all extremities, sensation intact to light touch HEENT:  EOMs intact Chest:  Even non-labored respirations Skin:  Dressing C/D/I, no rashes or lesions Extremities: warm/dry, no visible edema, erythmema, or echymosis.  No lymphadenopathy. Pulses: Popliteus 2+ MSK:  ROM: EHL/FHL intact, MMT: patient is able to perform quad set, (-) Homan's   Disposition: 01-Home or Self Care     Medication List    TAKE these medications         amphetamine-dextroamphetamine 30 MG tablet  Commonly known as:  ADDERALL  Take 30 mg by mouth 3 (three) times daily.     ARIPiprazole 15 MG tablet  Commonly known as:  ABILIFY  Take 7.5 mg by mouth at bedtime.     clonazePAM 1 MG tablet  Commonly known as:  KLONOPIN  Take 0.5-1 mg by mouth 2 (two) times daily. Take 1/2 tablet in the morning, and 1 tablet in the evening     docusate sodium 100 MG capsule  Commonly known as:  COLACE  Take 1 capsule (100 mg total) by mouth 2 (two) times daily. While taking narcotic pain medicine.     DULoxetine 60 MG capsule  Commonly known as:  CYMBALTA  Take 120 mg by mouth every evening.     esomeprazole 40 MG capsule  Commonly known as:  NEXIUM  Take 40 mg by mouth every other day.     lisinopril 20 MG tablet  Commonly known as:  PRINIVIL,ZESTRIL  Take 20 mg by mouth daily.     multivitamin with minerals Tabs tablet  Take 1 tablet by mouth every other day.     ondansetron 4 MG tablet  Commonly known as:  ZOFRAN  Take 1 tablet (4 mg total) by mouth daily as needed for nausea or vomiting.     oxyCODONE 5 MG immediate release tablet  Commonly known as:  ROXICODONE  Take 1-2 tablets (5-10 mg total) by mouth every 4 (four) hours as needed for moderate pain or severe pain.     phentermine 37.5 MG capsule  Take 37.5 mg by mouth every morning.     senna 8.6 MG Tabs  tablet  Commonly known as:  SENOKOT  Take 2 tablets (17.2 mg total) by mouth 2 (two) times daily.     simvastatin 20 MG tablet  Commonly known as:  ZOCOR  Take 20 mg by mouth at bedtime.     topiramate 25 MG tablet  Commonly known as:  TOPAMAX  Take 25 mg by mouth 3 (three) times daily.           Follow-up Information    Follow up with HEWITT, Jenny Reichmann, MD. Schedule an appointment as soon as possible for a visit in 2 weeks.   Specialty:  Orthopedic Surgery   Contact information:   [address] [place] [address] [id number]        Signed: Mechele Claude, Hershal Coria, [facility] Office:  [phone]

## 2015-07-05 NOTE — Progress Notes (Signed)
Peripherally Inserted Central Catheter/Midline Placement  The IV Nurse has discussed with the patient and/or persons authorized to consent for the patient, the purpose of this procedure and the potential benefits and risks involved with this procedure.  The benefits include less needle sticks, lab draws from the catheter and patient may be discharged home with the catheter.  Risks include, but not limited to, infection, bleeding, blood clot (thrombus formation), and puncture of an artery; nerve damage and irregular heat beat.  Alternatives to this procedure were also discussed.  PICC/Midline Placement Documentation        Darlyn Read 07/05/2015, 5:11 PM

## 2015-07-05 NOTE — Progress Notes (Signed)
[facility]  Patient Status:  New patient for [facility] this admission  [facility] is providing the following services: HHRN and PT is needed/ordered and home infusion pharmacy for home IV ABX. Teaching provided in tonight with patient's husband who will be primary caregiver once home.  He will do well at home.  [facility] is prepared for weekend DC but will need IV ABX script faxed to [facility] pharmacy to fill and deliver to the home.    If patient discharges after hours, please call [phone].   Larry Sierras 07/05/2015, 10:18 PM

## 2015-07-05 NOTE — Discharge Instructions (Signed)
Whitney Simmer, MD [place]  Please read the following information regarding your care after surgery.  Medications  You only need a prescription for the narcotic pain medicine (ex. oxycodone, Percocet, Norco).  All of the other medicines listed below are available over the counter. X acetominophen (Tylenol) 650 mg every 4-6 hours as you need for minor pain X oxycodone as prescribed for moderate to severe pain ?   Narcotic pain medicine (ex. oxycodone, Percocet, Vicodin) will cause constipation.  To prevent this problem, take the following medicines while you are taking any pain medicine. X docusate sodium (Colace) 100 mg twice a day X senna (Senokot) 2 tablets twice a day   Weight Bearing X Bear weight when you are able on your operated leg or foot in flat post-op shoe.  Dressing X Keep your dressing clean and dry.  Dont put anything (coat hanger, pencil, etc) down inside of it.  If it gets damp, use a hair dryer on the cool setting to dry it.  If it gets soaked, call the office to schedule an appointment for a cast change.     After your dressing, cast or splint is removed; you may shower, but do not soak or scrub the wound.  Allow the water to run over it, and then gently pat it dry.  Swelling It is normal for you to have swelling where you had surgery.  To reduce swelling and pain, keep your toes above your nose for at least 3 days after surgery.  It may be necessary to keep your foot or leg elevated for several weeks.  If it hurts, it should be elevated.  Follow Up Call my office at [phone] when you are discharged from the hospital or surgery center to schedule an appointment to be seen two weeks after surgery.  Call my office at [phone] if you develop a fever >101.5 F, nausea, vomiting, bleeding from the surgical site or severe pain.

## 2015-07-06 DIAGNOSIS — B9561 Methicillin susceptible Staphylococcus aureus infection as the cause of diseases classified elsewhere: Secondary | ICD-10-CM

## 2015-07-06 LAB — BASIC METABOLIC PANEL
Anion gap: 10 (ref 5–15)
BUN: 7 mg/dL (ref 6–20)
CHLORIDE: 106 mmol/L (ref 101–111)
CO2: 22 mmol/L (ref 22–32)
Calcium: 9 mg/dL (ref 8.9–10.3)
Creatinine, Ser: 0.83 mg/dL (ref 0.44–1.00)
GFR calc Af Amer: >60 mL/min (ref >60)
GFR calc non Af Amer: >60 mL/min (ref >60)
Glucose, Bld: 139 mg/dL — ABNORMAL HIGH (ref 65–99)
POTASSIUM: 3.7 mmol/L (ref 3.5–5.1)
SODIUM: 138 mmol/L (ref 135–145)

## 2015-07-06 LAB — VANCOMYCIN, TROUGH: Vancomycin Tr: 13 ug/mL (ref 10.0–20.0)

## 2015-07-06 LAB — HEPATITIS C ANTIBODY: HCV Ab: <0.1 s/co ratio (ref 0.0–0.9)

## 2015-07-06 MED ORDER — VANCOMYCIN HCL 10 G IV SOLR
1500.0000 mg | Freq: Two times a day (BID) | INTRAVENOUS | Status: DC
  Administered 2015-07-06 – 2015-07-08 (×4): 1500 mg via INTRAVENOUS
  Filled 2015-07-06 (×5): qty 1500

## 2015-07-06 NOTE — Progress Notes (Signed)
ANTIBIOTIC CONSULT NOTE   Pharmacy Consult for Vancomycin Indication: R foot deep abscess  Allergies  Allergen Reactions  . Codeine Nausea Only  . Diclofenac Sodium Other (See Comments)    Patient Measurements: Height: '5\' 6"'  (167.6 cm) Weight: 192 lb (87.091 kg) IBW/kg (Calculated) : 59.3 Adjusted Body Weight:   Vital Signs: Temp: 97.5 F (36.4 C) (02/11 1300) BP: 103/65 mmHg (02/11 1300) Pulse Rate: 92 (02/11 1300) Intake/Output from previous day: 02/10 0701 - 02/11 0700 In: 1402.5 [P.O.:720; I.V.:482.5; IV Piggyback:200] Out: -  Intake/Output from this shift:    Labs:  Recent Labs  07/04/15 0844 07/06/15 1855  WBC 7.4  --   HGB 13.4  --   PLT 228  --   CREATININE 0.80 0.83   Estimated Creatinine Clearance: 84.1 mL/min (by C-G formula based on Cr of 0.83).  Recent Labs  07/05/15 1857 07/06/15 1855  VANCOTROUGH 18* 13     Microbiology:   Medical History: Past Medical History  Diagnosis Date  . Hypertension   . Complication of anesthesia     hard to wake up  . Depression   . Bipolar disorder ([place])   . Anxiety   . GERD (gastroesophageal reflux disease)   . Family history of adverse reaction to anesthesia     "sister hard to wake up also"  . Hyperlipidemia   . OSA (obstructive sleep apnea)     wears mouth piece, no CPAP  . Migraine     "hormonal; stopped when I was 16" (07/04/2015)  . Arthritis     feet, knees (07/04/2015)  . Fibromyalgia dx'd ~ 2000   Assessment:  Infectious Disease: Vanc/Zosyn D#3 for right 2nd toe abscess + osteo, s/p I&D 2/9 - afebrile, WBC WNL, CRP elevated; ID rec 6 wks of abx. Renal function stable. -*note: dose was NOT given 2/11 at 0200 with reason "order parameters not met"  Vanc 2/9 >> --2/10 VT = 58, but drawn after dose hung, no changes made --2/11: VT 13 (but missed dose this AM) Zosyn 2/9 >> 2/9 Ceftriaxone 2/9>>  2/9 tissue cx - ngtd 2/9 wound cx - ngtd  Goal of Therapy:  Vancomycin trough level 15-20  mcg/ml  Plan:  No change in total daily dose but change to Vancomycin 1537m IV q12 hrs.   Bekka Qian S. RAlford Highland PharmD, BCPS Clinical Staff Pharmacist Pager [phone] REilene GhaziStillinger 07/06/2015,7:52 PM

## 2015-07-06 NOTE — Progress Notes (Addendum)
[place] for Infectious Disease    Date of Admission:  07/04/2015   Total days of antibiotics 3        Day 3 vanco/ceftriaxone           ID: [place] W Wartman is a 55 y.o. female POD#2 s/p IRRIGATION AND DEBRIDEMENT OF RIGHT FOOT DEEP ABSCESS; EXCISION OF RIGHT SECOND METATARSAL HEAD; REMOVAL OF DEEP IMPLANT RIGHT FOOT; EXCISION OF RIGHT SECOND TOE PROXIMAL PHALANX BASE; INSERTION OF ANTIBIOTIC BEADS INTO RIGHT FOOT ABSCESS (Right) Active Problems:   Foot osteomyelitis, right (HCC)    Subjective: Afebrile, feels like her toes are swollen. Elevating her foot has helped  Medications:  . amphetamine-dextroamphetamine  30 mg Oral TID  . ARIPiprazole  7.5 mg Oral QHS  . cefTRIAXone (ROCEPHIN)  IV  2 g Intravenous Q24H  . clonazePAM  0.5 mg Oral Daily  . clonazePAM  1 mg Oral QHS  . docusate sodium  100 mg Oral BID  . DULoxetine  120 mg Oral QPM  . lisinopril  20 mg Oral Daily  . pantoprazole  40 mg Oral Daily  . senna  1 tablet Oral BID  . simvastatin  20 mg Oral QHS  . topiramate  25 mg Oral TID  . vancomycin  1,000 mg Intravenous Q8H    Objective: Vital signs in last 24 hours: Temp:  [98.2 F (36.8 C)-98.9 F (37.2 C)] 98.7 F (37.1 C) (02/11 0700) Pulse Rate:  [87-100] 100 (02/11 0700) Resp:  [16-20] 18 (02/11 0700) BP: (100-110)/(61-78) 110/78 mmHg (02/11 0700) SpO2:  [99 %-100 %] 100 % (02/10 1900) Physical Exam  Constitutional:  oriented to person, place, and time. appears well-developed and well-nourished. No distress.  HENT: [place]/AT, PERRLA, no scleral icterus Mouth/Throat: Oropharynx is clear and moist. No oropharyngeal exudate.  Cardiovascular: Normal rate, regular rhythm and normal heart sounds. Exam reveals no gallop and no friction rub.  No murmur heard.  Pulmonary/Chest: Effort normal and breath sounds normal. No respiratory distress.  has no wheezes.  Neck = supple, no nuchal rigidity Abdominal: Soft. Bowel sounds are normal.  exhibits no  distension. There is no tenderness.  Lymphadenopathy: no cervical adenopathy. No axillary adenopathy Neurological: alert and oriented to person, place, and time. Moving toes without difficulty Skin: right foot wrapped. Warm toes, Psychiatric: a normal mood and affect.  behavior is normal.    Lab Results  Recent Labs  07/04/15 0844  WBC 7.4  HGB 13.4  HCT 38.9  NA 139  K 4.1  CL 106  CO2 23  BUN 11  CREATININE 0.80   Liver Panel No results for input(s): PROT, ALBUMIN, AST, ALT, ALKPHOS, BILITOT, BILIDIR, IBILI in the last 72 hours. Sedimentation Rate  Recent Labs  07/05/15 0545  ESRSEDRATE 7   C-Reactive Protein  Recent Labs  07/05/15 0545  CRP 2.3*    Microbiology: 2/9 tissue cultures growing staph aurues Studies/Results: No results found.   Assessment/Plan: Foot osteomyelitis = cultures are growing staph aureus. Awaiting susceptibilties. We will change antibiotics to cefazolin if MSSA (awaiting to see sensitivies, should be out tomorrow) to decide which we will go with for discharge.  --------------------home health orders------------------------- Diagnosis: Foot osteo  Culture Result: staph aureus (SENSI PENDING)  Allergies  Allergen Reactions  . Codeine Nausea Only  . Diclofenac Sodium Other (See Comments)    Discharge antibiotics: Per pharmacy protocol  PENDING Aim for Vancomycin trough 15-20 (unless otherwise indicated) Duration: 6 wk End Date: March 22nd  [place]  Per Protocol:  Labs weekly while on IV antibiotics: _x_ CBC with differential _x_ BMP twice per week _x_ CRP -l ast week only _x ESR -l ast week only _x_ Vancomycin trough  Fax weekly labs to [phone]  Clinic Follow Up Appt: 4-6 wk with Dr. Baxter Flattery  '@[facility]'     Baxter Flattery, [facility] for Infectious Diseases Cell: [phone] Pager: [phone]  07/06/2015, 9:47 AM

## 2015-07-06 NOTE — Progress Notes (Signed)
Subjective: 2 Days Post-Op Procedure(s) (LRB): IRRIGATION AND DEBRIDEMENT OF RIGHT FOOT DEEP ABSCESS; EXCISION OF RIGHT SECOND METATARSAL HEAD; REMOVAL OF DEEP IMPLANT RIGHT FOOT; EXCISION OF RIGHT SECOND TOE PROXIMAL PHALANX BASE; INSERTION OF ANTIBIOTIC BEADS INTO RIGHT FOOT ABSCESS (Right) Patient reports pain as 3 on 0-10 scale.   Comfortable. Awaiting culture results and ID recs on abx type route and duration.  Objective: Vital signs in last 24 hours: Temp:  [98.2 F (36.8 C)-98.9 F (37.2 C)] 98.7 F (37.1 C) (02/11 0700) Pulse Rate:  [87-100] 100 (02/11 0700) Resp:  [16-20] 18 (02/11 0700) BP: (100-110)/(61-78) 110/78 mmHg (02/11 0700) SpO2:  [99 %-100 %] 100 % (02/10 1900)  Intake/Output from previous day: 02/10 0701 - 02/11 0700 In: 1402.5 [P.O.:720; I.V.:482.5; IV Piggyback:200] Out: -  Intake/Output this shift: Total I/O In: 360 [P.O.:360] Out: -    Recent Labs  07/04/15 0844  HGB 13.4    Recent Labs  07/04/15 0844  WBC 7.4  RBC 4.56  HCT 38.9  PLT 228    Recent Labs  07/04/15 0844  NA 139  K 4.1  CL 106  CO2 23  BUN 11  CREATININE 0.80  GLUCOSE 110*  CALCIUM 9.3   No results for input(s): LABPT, INR in the last 72 hours.  Incision: dressing C/D/I Excellent cap refill.  Assessment/Plan: 2 Days Post-Op Procedure(s) (LRB): IRRIGATION AND DEBRIDEMENT OF RIGHT FOOT DEEP ABSCESS; EXCISION OF RIGHT SECOND METATARSAL HEAD; REMOVAL OF DEEP IMPLANT RIGHT FOOT; EXCISION OF RIGHT SECOND TOE PROXIMAL PHALANX BASE; INSERTION OF ANTIBIOTIC BEADS INTO RIGHT FOOT ABSCESS (Right) Up with therapy  Following cukltures and ID recs.  Nykira Reddix ANDREW 07/06/2015, 10:38 AM

## 2015-07-07 NOTE — Progress Notes (Signed)
   Subjective: 3 Days Post-Op Procedure(s) (LRB): IRRIGATION AND DEBRIDEMENT OF RIGHT FOOT DEEP ABSCESS; EXCISION OF RIGHT SECOND METATARSAL HEAD; REMOVAL OF DEEP IMPLANT RIGHT FOOT; EXCISION OF RIGHT SECOND TOE PROXIMAL PHALANX BASE; INSERTION OF ANTIBIOTIC BEADS INTO RIGHT FOOT ABSCESS (Right) Patient reports pain as mild.   Patient seen in rounds for Dr. Doran Durand Patient is well, and has had no acute complaints or problems. No SOB or chest pain. Voiding well. Positive BM. No issues overnight.    Objective: Vital signs in last 24 hours: Temp:  [97.5 F (36.4 C)-98.6 F (37 C)] 98.6 F (37 C) (02/12 0614) Pulse Rate:  [81-92] 84 (02/12 0614) Resp:  [16-20] 18 (02/12 0614) BP: (87-104)/(63-70) 87/70 mmHg (02/12 0614) SpO2:  [97 %-98 %] 98 % (02/12 0614)  Intake/Output from previous day:  Intake/Output Summary (Last 24 hours) at 07/07/15 0819 Last data filed at 07/06/15 1300  Gross per 24 hour  Intake    240 ml  Output      0 ml  Net    240 ml     Labs:  Recent Labs  07/04/15 0844  HGB 13.4    Recent Labs  07/04/15 0844  WBC 7.4  RBC 4.56  HCT 38.9  PLT 228    Recent Labs  07/04/15 0844 07/06/15 1855  NA 139 138  K 4.1 3.7  CL 106 106  CO2 23 22  BUN 11 7  CREATININE 0.80 0.83  GLUCOSE 110* 139*  CALCIUM 9.3 9.0    EXAM General - Patient is Alert and Oriented Extremity - Neurologically intact Sensation intact distally Dorsiflexion/Plantar flexion intact Compartment soft Dressing/Incision - clean, dry Motor Function - intact, moving foot and toes well on exam.   Past Medical History  Diagnosis Date  . Hypertension   . Complication of anesthesia     hard to wake up  . Depression   . Bipolar disorder ([place])   . Anxiety   . GERD (gastroesophageal reflux disease)   . Family history of adverse reaction to anesthesia     "sister hard to wake up also"  . Hyperlipidemia   . OSA (obstructive sleep apnea)     wears mouth piece, no CPAP  . Migraine      "hormonal; stopped when I was 16" (07/04/2015)  . Arthritis     feet, knees (07/04/2015)  . Fibromyalgia dx'd ~ 2000    Assessment/Plan: 3 Days Post-Op Procedure(s) (LRB): IRRIGATION AND DEBRIDEMENT OF RIGHT FOOT DEEP ABSCESS; EXCISION OF RIGHT SECOND METATARSAL HEAD; REMOVAL OF DEEP IMPLANT RIGHT FOOT; EXCISION OF RIGHT SECOND TOE PROXIMAL PHALANX BASE; INSERTION OF ANTIBIOTIC BEADS INTO RIGHT FOOT ABSCESS (Right) Active Problems:   Foot osteomyelitis, right (HCC)  Estimated body mass index is 31 kg/(m^2) as calculated from the following:   Height as of this encounter: 5\' 6"  (1.676 m).   Weight as of this encounter: 87.091 kg (192 lb). Advance diet Continue ABX therapy. ID will adjust based on cultures. Plan for discharge tomorrow DVT Prophylaxis - [place], PA-C Orthopaedic Surgery 07/07/2015, 8:19 AM

## 2015-07-08 DIAGNOSIS — A4901 Methicillin susceptible Staphylococcus aureus infection, unspecified site: Secondary | ICD-10-CM | POA: Insufficient documentation

## 2015-07-08 LAB — TISSUE CULTURE

## 2015-07-08 LAB — WOUND CULTURE

## 2015-07-08 MED ORDER — HEPARIN SOD (PORK) LOCK FLUSH 100 UNIT/ML IV SOLN
250.0000 [IU] | INTRAVENOUS | Status: AC | PRN
  Administered 2015-07-08: 250 [IU]

## 2015-07-08 MED ORDER — CEFAZOLIN SODIUM-DEXTROSE 2-3 GM-% IV SOLR
2.0000 g | Freq: Three times a day (TID) | INTRAVENOUS | Status: DC
  Administered 2015-07-08: 2 g via INTRAVENOUS
  Filled 2015-07-08 (×4): qty 50

## 2015-07-08 MED ORDER — DEXTROSE 5 % IV SOLN: 2.0000 g | Freq: Three times a day (TID) | INTRAVENOUS | Status: AC

## 2015-07-08 NOTE — Progress Notes (Addendum)
[facility] for Infectious Disease    Date of Admission:  07/04/2015   Total days of antibiotics 5        Day 5 vanco           ID: Whitney Lewis is a 55 y.o. female with  MSSA osteo of right foot s/p HW removal Active Problems:   Foot osteomyelitis, right (HCC)    Subjective: Afebrile, looking forward to going home. Doesn't foresee problem with taking meds Q 8hr  Medications:  . amphetamine-dextroamphetamine  30 mg Oral TID  . ARIPiprazole  7.5 mg Oral QHS  .  ceFAZolin (ANCEF) IV  2 g Intravenous 3 times per day  . clonazePAM  0.5 mg Oral Daily  . clonazePAM  1 mg Oral QHS  . docusate sodium  100 mg Oral BID  . DULoxetine  120 mg Oral QPM  . lisinopril  20 mg Oral Daily  . pantoprazole  40 mg Oral Daily  . senna  1 tablet Oral BID  . simvastatin  20 mg Oral QHS  . topiramate  25 mg Oral TID    Objective: Vital signs in last 24 hours: Temp:  [98 F (36.7 C)-98.1 F (36.7 C)] 98.1 F (36.7 C) (02/13 0517) Pulse Rate:  [81-89] 89 (02/13 0517) Resp:  [16] 16 (02/13 0517) BP: (102-127)/(68-81) 127/81 mmHg (02/13 0517) SpO2:  [98 %] 98 % (02/13 0517) Physical Exam  Constitutional:  oriented to person, place, and time. appears well-developed and well-nourished. No distress.  HENT: [place]/AT, PERRLA, no scleral icterus Mouth/Throat: Oropharynx is clear and moist. No oropharyngeal exudate.  Cardiovascular: Normal rate, regular rhythm and normal heart sounds. Exam reveals no gallop and no friction rub.  No murmur heard.  Pulmonary/Chest: Effort normal and breath sounds normal. No respiratory distress.  has no wheezes.  Skin: right foot wrapped   Lab Results  Recent Labs  07/06/15 1855  NA 138  K 3.7  CL 106  CO2 22  BUN 7  CREATININE 0.83   Lab Results  Component Value Date   ESRSEDRATE 7 07/05/2015   Lab Results  Component Value Date   CRP 2.3* 07/05/2015    Microbiology: MSSA- oxa S  Assessment/Plan: Culture data suggests she had MSSA  osteomyelitis. Will discontinue vancomycin and ceftriaxone  --> switch her to cefazolin 2gm IV Q 8hr. I have communicated this to Carolynn Sayers, [facility] coordinator Will arrange follow up appt in the ID clinic   Below are home health orders for IV cefazolin-----------------------   Diagnosis: osteomyelitis  Culture Result: MSSA  Allergies  Allergen Reactions  . Codeine Nausea Only  . Diclofenac Sodium Other (See Comments)    Discharge antibiotics: Per pharmacy protocol  Cefazolin 2gm IV Q 8hr  Duration: 6 wk  End Date:  March 24th  [place] Per Protocol:  Labs weekly while on IV antibiotics: _x_ CBC with differential _x_ bMP _x_ CRP - at the last week of abtx _x_ ESR - at the last week of abtx   Fax weekly labs to [phone]  Clinic Follow Up Appt: With Dr Baxter Flattery in 5-6 wk  @ [place], [facility] for Infectious Diseases Cell: [phone] Pager: [phone]  07/08/2015, 4:54 PM

## 2015-07-08 NOTE — Progress Notes (Signed)
Pt DCd to home - via WC to lobby, husband driving her home. Picc line left in place. IV team capped and flushed it. One dose of Ancef given prior to DC. IV abx prescription faxed to her pharmacy. Pt left at1700

## 2015-07-09 ENCOUNTER — Telehealth: Payer: Self-pay | Admitting: *Deleted

## 2015-07-09 LAB — ANAEROBIC CULTURE

## 2015-07-09 NOTE — Telephone Encounter (Signed)
Patient called and advised she was changed to cefazolin yesterday before discharge and has since developed a full body rash. Red itchy raised red patches that are growing into each other at this point. They are not on her face and neck and her lips or tongue nor throat feel like they are swelling. She reports no trouble breathing. Advised her will contact the pharmacy and have them page the doctor and they will call her once they have an answer. Also advised her not to take any more medication until she hears from [place]. She advised understanding and will wait for call from [place] or [facility].

## 2015-07-09 NOTE — Telephone Encounter (Signed)
Patient discharged to home 2/13, started new Cefazolin after discharge, having rash on torso and foot per Jeani Hawking at [facility].  Jeani Hawking will page Dr. Baxter Flattery for advice/orders. Landis Gandy, RN

## 2015-07-09 NOTE — Telephone Encounter (Signed)
Per verbal order called Jeani Hawking at [facility] and had them D/C the Cefazolin and start the patient on Vancomycin dosed per pharmacy protocol with standard weekly labs and same end date unless we call with new one in the future. Jeani Hawking read back and verified and he will call the patient as well.  For customer service will call the patient as well.

## 2015-07-11 ENCOUNTER — Telehealth: Payer: Self-pay | Admitting: Internal Medicine

## 2015-07-11 NOTE — Telephone Encounter (Signed)
Rash is improving. Developed pruritic maculopapular rash to her body and extremities shortly after being swtiched to cefazolin. She has since stopped and switched to vancomycin. She reports that her rash has improved. It did not involve any mucosal areas. No angio edema. Will list it as an allergy

## 2015-07-15 ENCOUNTER — Telehealth: Payer: Self-pay | Admitting: Infectious Diseases

## 2015-07-15 NOTE — Telephone Encounter (Signed)
Pt d/c form hospital ~ 2-13 with MSSA osteo of foot.  She was on vanco in hospital, without difficulty.  She was then changed to ancef.  She began to have rash on 2-14. Was started on benadryl and allegra.  She was changed back to vanco 2-15.  Her rash persists today.  Will stop vanco til 2-22 and then resume if her rash has improved.  Will have [facility] call back at that time.

## 2015-07-15 NOTE — Telephone Encounter (Signed)
Thank you; [name] was already called by Dr. Johnnye Sima. [name]

## 2015-07-16 ENCOUNTER — Ambulatory Visit
Admit: 2015-07-16 | Discharge: 2015-07-16 | Disposition: A | Discharge status: Home / Self Care | Payer: 59 | Attending: Internal Medicine | Admitting: Internal Medicine

## 2015-07-16 DIAGNOSIS — Z1231 Encounter for screening mammogram for malignant neoplasm of breast: Secondary | ICD-10-CM | POA: Diagnosis present

## 2015-07-17 ENCOUNTER — Telehealth: Payer: Self-pay

## 2015-07-17 DIAGNOSIS — I10 Essential (primary) hypertension: Secondary | ICD-10-CM | POA: Insufficient documentation

## 2015-07-17 DIAGNOSIS — G4733 Obstructive sleep apnea (adult) (pediatric): Secondary | ICD-10-CM | POA: Insufficient documentation

## 2015-07-17 DIAGNOSIS — E785 Hyperlipidemia, unspecified: Secondary | ICD-10-CM | POA: Insufficient documentation

## 2015-07-17 DIAGNOSIS — F988 Other specified behavioral and emotional disorders with onset usually occurring in childhood and adolescence: Secondary | ICD-10-CM | POA: Insufficient documentation

## 2015-07-17 DIAGNOSIS — F319 Bipolar disorder, unspecified: Secondary | ICD-10-CM | POA: Insufficient documentation

## 2015-07-17 NOTE — Telephone Encounter (Signed)
[facility] Nurse calling to report patient's rash is better. Her Vancomycin was held for two days. Asking for orders on continuing or disconinuing Antibiotic.  Per Dr Baxter Flattery ok to restart Vancomycin 1500 mgs every 12 hours.  Restart protocol for labs.   Laverle Patter, RN

## 2015-08-06 ENCOUNTER — Ambulatory Visit (INDEPENDENT_AMBULATORY_CARE_PROVIDER_SITE_OTHER): Payer: 59 | Admitting: Emergency Medicine

## 2015-08-06 ENCOUNTER — Ambulatory Visit (INDEPENDENT_AMBULATORY_CARE_PROVIDER_SITE_OTHER): Payer: 59

## 2015-08-06 DIAGNOSIS — R0789 Other chest pain: Secondary | ICD-10-CM

## 2015-08-06 DIAGNOSIS — M542 Cervicalgia: Secondary | ICD-10-CM

## 2015-08-06 NOTE — Patient Instructions (Addendum)
IF you received an x-ray today, you will receive an invoice from [facility]. Please contact [facility] at [phone] with questions or concerns regarding your invoice.   IF you received labwork today, you will receive an invoice from [employer]. Please contact [facility] at [phone] with questions or concerns regarding your invoice.   Our billing staff will not be able to assist you with questions regarding bills from these companies.  You will be contacted with the lab results as soon as they are available. The fastest way to get your results is to activate your My Chart account. Instructions are located on the last page of this paperwork. If you have not heard from [place] regarding the results in 2 weeks, please contact this office.  There are degenerative changes on your neck films as well as 1 level with some slippage. It is important for you to follow-up with your orthopedist. I have put in a referral to Dr. Doran Durand he orthopedist. Please try ice and heat to the affected areas.
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
## 2015-08-08 ENCOUNTER — Telehealth: Payer: Self-pay

## 2015-08-08 NOTE — Telephone Encounter (Signed)
Patient is okay with anything without codeine and nonsteroidals.  She is going to [employer] for the next few days.   She is okay with the [facility] stores

## 2015-08-08 NOTE — Telephone Encounter (Signed)
Please call patient and see what pain medication she can take. We have her down is allergic to codeine and nonsteroidals and I am not sure what she is able to take.
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
## 2015-08-10 NOTE — Telephone Encounter (Signed)
Lm message per Dr. Caren Griffins note.

## 2015-08-10 NOTE — Telephone Encounter (Signed)
I can talk with her when she gets back but all of the narcotics are related to codeine and since she cannot take nonsteroidals and is allergic to codeine she is limited to only taking extra strength Tylenol for pain

## 2015-08-13 ENCOUNTER — Encounter: Payer: Self-pay | Admitting: Internal Medicine

## 2015-08-13 ENCOUNTER — Telehealth: Payer: Self-pay

## 2015-08-13 ENCOUNTER — Ambulatory Visit (INDEPENDENT_AMBULATORY_CARE_PROVIDER_SITE_OTHER): Payer: 59 | Admitting: Internal Medicine

## 2015-08-13 VITALS — BP 107/74 | HR 91 | Temp 98.2°F | Wt 200.0 lb

## 2015-08-13 DIAGNOSIS — T814XXS Infection following a procedure, sequela: Secondary | ICD-10-CM

## 2015-08-13 DIAGNOSIS — IMO0001 Reserved for inherently not codable concepts without codable children: Secondary | ICD-10-CM

## 2015-08-13 NOTE — Telephone Encounter (Signed)
[place] and spoke with Coretta and notified her that Dr. Baxter Flattery gave a V.O. to d/c PICC line on the 24th after the last dose on the 23rd.
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
## 2016-12-15 ENCOUNTER — Other Ambulatory Visit: Payer: Self-pay | Admitting: Neurological Surgery

## 2016-12-28 NOTE — Pre-Procedure Instructions (Signed)
[place]  12/28/2016      [facility] - [address], [address] [place]  [address] Phone: [phone] Fax: [phone]    Your procedure is scheduled on August 10  Report to [facility] at [place].M.  Call this number if you have problems the morning of surgery:  [phone]   Remember:  Do not eat food or drink liquids after midnight.  Continue all other medications as directed by your physician except follow these instructions about you medications    Take these medicines the morning of surgery with A SIP OF WATER ARIPiprazole (ABILIFY), methocarbamol (ROBAXIN) , traMADol (ULTRAM) if needed  7 days prior to surgery STOP taking any Aspirin, Aleve, Naproxen, Ibuprofen, Motrin, Advil, Goody's, BC's, all herbal medications, fish oil, and all vitamins    Do not wear jewelry, make-up or nail polish.  Do not wear lotions, powders, or perfumes, or deoderant.  Do not shave 48 hours prior to surgery.   Do not bring valuables to the hospital.  [facility] is not responsible for any belongings or valuables.  Contacts, dentures or bridgework may not be worn into surgery.  Leave your suitcase in the car.  After surgery it may be brought to your room.  For patients admitted to the hospital, discharge time will be determined by your treatment team.  Patients discharged the day of surgery will not be allowed to drive home.    Special instructions:   [place]- Preparing For Surgery  Before surgery, you can play an important role. Because skin is not sterile, your skin needs to be as free of germs as possible. You can reduce the number of germs on your skin by washing with CHG (chlorahexidine gluconate) Soap before surgery.  CHG is an antiseptic cleaner which kills germs and bonds with the skin to continue killing germs even after washing.  Please do not use if  you have an allergy to CHG or antibacterial soaps. If your skin becomes reddened/irritated stop using the CHG.  Do not shave (including legs and underarms) for at least 48 hours prior to first CHG shower. It is OK to shave your face.  Please follow these instructions carefully.   1. Shower the NIGHT BEFORE SURGERY and the MORNING OF SURGERY with CHG.   2. If you chose to wash your hair, wash your hair first as usual with your normal shampoo.  3. After you shampoo, rinse your hair and body thoroughly to remove the shampoo.  4. Use CHG as you would any other liquid soap. You can apply CHG directly to the skin and wash gently with a scrungie or a clean washcloth.   5. Apply the CHG Soap to your body ONLY FROM THE NECK DOWN.  Do not use on open wounds or open sores. Avoid contact with your eyes, ears, mouth and genitals (private parts). Wash genitals (private parts) with your normal soap.  6. Wash thoroughly, paying special attention to the area where your surgery will be performed.  7. Thoroughly rinse your body with warm water from the neck down.  8. DO NOT shower/wash with your normal soap after using and rinsing off the CHG Soap.  9. Pat yourself dry with a CLEAN TOWEL.   10. Wear CLEAN PAJAMAS   11. Place CLEAN SHEETS on your bed the night of your first shower and DO NOT SLEEP  WITH PETS.    Day of Surgery: Do not apply any deodorants/lotions. Please wear clean clothes to the hospital/surgery center.      Please read over the following fact sheets that you were given.

## 2016-12-29 ENCOUNTER — Encounter (HOSPITAL_COMMUNITY)
Admission: RE | Admit: 2016-12-29 | Discharge: 2016-12-29 | Disposition: A | Discharge status: Home / Self Care | Payer: Medicare Other | Source: Ambulatory Visit | Attending: Neurological Surgery | Admitting: Neurological Surgery

## 2016-12-29 ENCOUNTER — Encounter (HOSPITAL_COMMUNITY): Payer: Self-pay

## 2016-12-29 DIAGNOSIS — G4733 Obstructive sleep apnea (adult) (pediatric): Secondary | ICD-10-CM

## 2016-12-29 DIAGNOSIS — E785 Hyperlipidemia, unspecified: Secondary | ICD-10-CM

## 2016-12-29 DIAGNOSIS — Z79899 Other long term (current) drug therapy: Secondary | ICD-10-CM | POA: Insufficient documentation

## 2016-12-29 DIAGNOSIS — Z01810 Encounter for preprocedural cardiovascular examination: Secondary | ICD-10-CM

## 2016-12-29 DIAGNOSIS — M48061 Spinal stenosis, lumbar region without neurogenic claudication: Secondary | ICD-10-CM

## 2016-12-29 DIAGNOSIS — R030 Elevated blood-pressure reading, without diagnosis of hypertension: Secondary | ICD-10-CM

## 2016-12-29 LAB — CBC WITH DIFFERENTIAL/PLATELET
BASOS PCT: 0 %
Basophils Absolute: 0 10*3/uL (ref 0.0–0.1)
Eosinophils Absolute: 0.2 10*3/uL (ref 0.0–0.7)
Eosinophils Relative: 2 %
HEMATOCRIT: 38.9 % (ref 36.0–46.0)
HEMOGLOBIN: 13.6 g/dL (ref 12.0–15.0)
LYMPHS ABS: 2.3 10*3/uL (ref 0.7–4.0)
LYMPHS PCT: 32 %
MCH: 30 pg (ref 26.0–34.0)
MCHC: 35 g/dL (ref 30.0–36.0)
MCV: 85.9 fL (ref 78.0–100.0)
MONOS PCT: 7 %
Monocytes Absolute: 0.5 10*3/uL (ref 0.1–1.0)
NEUTROS ABS: 4.4 10*3/uL (ref 1.7–7.7)
NEUTROS PCT: 59 %
Platelets: 253 10*3/uL (ref 150–400)
RBC: 4.53 MIL/uL (ref 3.87–5.11)
RDW: 12 % (ref 11.5–15.5)
WBC: 7.4 10*3/uL (ref 4.0–10.5)

## 2016-12-29 LAB — SURGICAL PCR SCREEN
MRSA, PCR: NEGATIVE
Staphylococcus aureus: NEGATIVE

## 2016-12-29 LAB — BASIC METABOLIC PANEL
ANION GAP: 7 (ref 5–15)
BUN: 11 mg/dL (ref 6–20)
CALCIUM: 9.8 mg/dL (ref 8.9–10.3)
CHLORIDE: 101 mmol/L (ref 101–111)
CO2: 30 mmol/L (ref 22–32)
Creatinine, Ser: 0.73 mg/dL (ref 0.44–1.00)
GFR calc non Af Amer: >60 mL/min (ref >60)
GLUCOSE: 93 mg/dL (ref 65–99)
POTASSIUM: 4.2 mmol/L (ref 3.5–5.1)
Sodium: 138 mmol/L (ref 135–145)

## 2016-12-29 LAB — PROTIME-INR
INR: 1.03
Prothrombin Time: 13.6 seconds (ref 11.4–15.2)

## 2016-12-29 LAB — TYPE AND SCREEN
ABO/RH(D): AB NEG
Antibody Screen: NEGATIVE

## 2016-12-29 LAB — ABO/RH: ABO/RH(D): AB NEG

## 2016-12-29 NOTE — Progress Notes (Signed)
PCP - Vishwanath Hande Cardiologist - denies  Chest x-ray - 12/29/16 EKG - 12/29/16 Stress Test - > 20 yers ago ECHO - denies Cardiac Cath - denies  Sleep Study - 3 months ago at [facility] office requesting record CPAP - wears a mouth piece to help with sleep apnea no CPAP    Patient denies shortness of breath, fever, cough and chest pain at PAT appointment   Patient verbalized understanding of instructions that were given to them at the PAT appointment. Patient was also instructed that they will need to review over the PAT instructions again at home before surgery.

## 2017-01-01 ENCOUNTER — Inpatient Hospital Stay (HOSPITAL_COMMUNITY): Payer: Medicare Other | Admitting: Certified Registered Nurse Anesthetist

## 2017-01-01 ENCOUNTER — Inpatient Hospital Stay (HOSPITAL_COMMUNITY): Payer: Medicare Other

## 2017-01-01 ENCOUNTER — Inpatient Hospital Stay (HOSPITAL_COMMUNITY): Payer: Medicare Other | Admitting: Vascular Surgery

## 2017-01-01 ENCOUNTER — Inpatient Hospital Stay (HOSPITAL_COMMUNITY)
Admission: RE | Admit: 2017-01-01 | Discharge: 2017-01-02 | DRG: 455 | Disposition: A | Discharge status: Home / Self Care | Payer: Medicare Other | Source: Ambulatory Visit | Attending: Neurological Surgery | Admitting: Neurological Surgery

## 2017-01-01 ENCOUNTER — Encounter (HOSPITAL_COMMUNITY): Payer: Self-pay | Admitting: Neurological Surgery

## 2017-01-01 ENCOUNTER — Encounter (HOSPITAL_COMMUNITY)
Admission: RE | Disposition: A | Discharge status: Home / Self Care | Payer: Self-pay | Source: Ambulatory Visit | Attending: Neurological Surgery

## 2017-01-01 DIAGNOSIS — K219 Gastro-esophageal reflux disease without esophagitis: Secondary | ICD-10-CM | POA: Diagnosis present

## 2017-01-01 DIAGNOSIS — Z79899 Other long term (current) drug therapy: Secondary | ICD-10-CM | POA: Diagnosis not present

## 2017-01-01 DIAGNOSIS — Z981 Arthrodesis status: Secondary | ICD-10-CM

## 2017-01-01 DIAGNOSIS — F319 Bipolar disorder, unspecified: Secondary | ICD-10-CM | POA: Diagnosis present

## 2017-01-01 DIAGNOSIS — Z7982 Long term (current) use of aspirin: Secondary | ICD-10-CM

## 2017-01-01 DIAGNOSIS — M4316 Spondylolisthesis, lumbar region: Principal | ICD-10-CM | POA: Diagnosis present

## 2017-01-01 DIAGNOSIS — Z881 Allergy status to other antibiotic agents status: Secondary | ICD-10-CM | POA: Diagnosis not present

## 2017-01-01 DIAGNOSIS — G4733 Obstructive sleep apnea (adult) (pediatric): Secondary | ICD-10-CM | POA: Diagnosis present

## 2017-01-01 DIAGNOSIS — F419 Anxiety disorder, unspecified: Secondary | ICD-10-CM | POA: Diagnosis present

## 2017-01-01 DIAGNOSIS — M5416 Radiculopathy, lumbar region: Secondary | ICD-10-CM | POA: Diagnosis present

## 2017-01-01 DIAGNOSIS — M797 Fibromyalgia: Secondary | ICD-10-CM | POA: Diagnosis present

## 2017-01-01 DIAGNOSIS — Z419 Encounter for procedure for purposes other than remedying health state, unspecified: Secondary | ICD-10-CM

## 2017-01-01 DIAGNOSIS — I10 Essential (primary) hypertension: Secondary | ICD-10-CM | POA: Diagnosis present

## 2017-01-01 DIAGNOSIS — Z885 Allergy status to narcotic agent status: Secondary | ICD-10-CM | POA: Diagnosis not present

## 2017-01-01 DIAGNOSIS — M7138 Other bursal cyst, other site: Secondary | ICD-10-CM | POA: Diagnosis present

## 2017-01-01 DIAGNOSIS — E785 Hyperlipidemia, unspecified: Secondary | ICD-10-CM | POA: Diagnosis present

## 2017-01-01 DIAGNOSIS — M532X6 Spinal instabilities, lumbar region: Secondary | ICD-10-CM | POA: Diagnosis present

## 2017-01-01 DIAGNOSIS — Z96652 Presence of left artificial knee joint: Secondary | ICD-10-CM | POA: Diagnosis present

## 2017-01-01 DIAGNOSIS — M48061 Spinal stenosis, lumbar region without neurogenic claudication: Secondary | ICD-10-CM | POA: Diagnosis present

## 2017-01-01 HISTORY — PX: MAXIMUM ACCESS (MAS)POSTERIOR LUMBAR INTERBODY FUSION (PLIF) 1 LEVEL: SHX6368

## 2017-01-01 SURGERY — FOR MAXIMUM ACCESS (MAS) POSTERIOR LUMBAR INTERBODY FUSION (PLIF) 1 LEVEL
Anesthesia: General | Site: Spine Lumbar

## 2017-01-01 MED ORDER — ONDANSETRON HCL 4 MG PO TABS: 4.0000 mg | ORAL_TABLET | Freq: Four times a day (QID) | ORAL | Status: DC | PRN

## 2017-01-01 MED ORDER — SUCCINYLCHOLINE CHLORIDE 20 MG/ML IJ SOLN
INTRAMUSCULAR | Status: DC | PRN
  Administered 2017-01-01: 120 mg via INTRAVENOUS

## 2017-01-01 MED ORDER — MEPERIDINE HCL 25 MG/ML IJ SOLN
6.2500 mg | INTRAMUSCULAR | Status: DC | PRN
Start: 2017-01-01 — End: 2017-01-01

## 2017-01-01 MED ORDER — ADULT MULTIVITAMIN W/MINERALS CH: 1.0000 | ORAL_TABLET | ORAL | Status: DC

## 2017-01-01 MED ORDER — HYDROMORPHONE HCL 1 MG/ML IJ SOLN
INTRAMUSCULAR | Status: AC
  Filled 2017-01-01: qty 1

## 2017-01-01 MED ORDER — MENTHOL 3 MG MT LOZG: 1.0000 | LOZENGE | OROMUCOSAL | Status: DC | PRN

## 2017-01-01 MED ORDER — 0.9 % SODIUM CHLORIDE (POUR BTL) OPTIME
TOPICAL | Status: DC | PRN
  Administered 2017-01-01: 1000 mL

## 2017-01-01 MED ORDER — VANCOMYCIN HCL 1000 MG IV SOLR
INTRAVENOUS | Status: DC | PRN
  Administered 2017-01-01: 1000 mg via TOPICAL

## 2017-01-01 MED ORDER — CELECOXIB 200 MG PO CAPS
200.0000 mg | ORAL_CAPSULE | Freq: Two times a day (BID) | ORAL | Status: DC
  Administered 2017-01-01 – 2017-01-02 (×2): 200 mg via ORAL
  Filled 2017-01-01 (×2): qty 1

## 2017-01-01 MED ORDER — SENNA 8.6 MG PO TABS
1.0000 | ORAL_TABLET | Freq: Two times a day (BID) | ORAL | Status: DC
  Administered 2017-01-01 – 2017-01-02 (×2): 8.6 mg via ORAL
  Filled 2017-01-01 (×2): qty 1

## 2017-01-01 MED ORDER — CHLORHEXIDINE GLUCONATE CLOTH 2 % EX PADS: 6.0000 | MEDICATED_PAD | Freq: Once | CUTANEOUS | Status: DC

## 2017-01-01 MED ORDER — MORPHINE SULFATE (PF) 4 MG/ML IV SOLN: 2.0000 mg | INTRAVENOUS | Status: DC | PRN

## 2017-01-01 MED ORDER — VANCOMYCIN HCL IN DEXTROSE 1-5 GM/200ML-% IV SOLN
1000.0000 mg | Freq: Once | INTRAVENOUS | Status: AC
Start: 2017-01-02 — End: 2017-01-02
  Administered 2017-01-02: 1000 mg via INTRAVENOUS
  Filled 2017-01-01: qty 200

## 2017-01-01 MED ORDER — LIDOCAINE HCL (CARDIAC) 20 MG/ML IV SOLN
INTRAVENOUS | Status: DC | PRN
  Administered 2017-01-01: 60 mg via INTRAVENOUS

## 2017-01-01 MED ORDER — ACETAMINOPHEN 10 MG/ML IV SOLN
INTRAVENOUS | Status: DC | PRN
  Administered 2017-01-01: 1000 mg via INTRAVENOUS

## 2017-01-01 MED ORDER — DEXAMETHASONE SODIUM PHOSPHATE 10 MG/ML IJ SOLN
INTRAMUSCULAR | Status: AC
  Filled 2017-01-01: qty 1

## 2017-01-01 MED ORDER — ARIPIPRAZOLE 5 MG PO TABS
15.0000 mg | ORAL_TABLET | Freq: Every day | ORAL | Status: DC
  Administered 2017-01-02: 15 mg via ORAL
  Filled 2017-01-01 (×2): qty 1

## 2017-01-01 MED ORDER — SODIUM CHLORIDE 0.9% FLUSH: 3.0000 mL | INTRAVENOUS | Status: DC | PRN

## 2017-01-01 MED ORDER — VANCOMYCIN HCL IN DEXTROSE 1-5 GM/200ML-% IV SOLN
1000.0000 mg | INTRAVENOUS | Status: AC
  Administered 2017-01-01: 1000 mg via INTRAVENOUS

## 2017-01-01 MED ORDER — POTASSIUM CHLORIDE IN NACL 20-0.9 MEQ/L-% IV SOLN: INTRAVENOUS | Status: DC

## 2017-01-01 MED ORDER — DULOXETINE HCL 30 MG PO CPEP
120.0000 mg | ORAL_CAPSULE | Freq: Every evening | ORAL | Status: DC
  Administered 2017-01-01: 120 mg via ORAL
  Filled 2017-01-01: qty 4

## 2017-01-01 MED ORDER — BUPIVACAINE HCL (PF) 0.5 % IJ SOLN
INTRAMUSCULAR | Status: AC
  Filled 2017-01-01: qty 30

## 2017-01-01 MED ORDER — ROCURONIUM BROMIDE 100 MG/10ML IV SOLN
INTRAVENOUS | Status: DC | PRN
Start: 2017-01-01 — End: 2017-01-01
  Administered 2017-01-01 (×2): 20 mg via INTRAVENOUS
  Administered 2017-01-01: 30 mg via INTRAVENOUS

## 2017-01-01 MED ORDER — GELATIN ABSORBABLE MT POWD
OROMUCOSAL | Status: DC | PRN
  Administered 2017-01-01: 5 mL via TOPICAL

## 2017-01-01 MED ORDER — VANCOMYCIN HCL 1000 MG IV SOLR
INTRAVENOUS | Status: AC
  Filled 2017-01-01: qty 1000

## 2017-01-01 MED ORDER — SODIUM CHLORIDE 0.9 % IR SOLN
Status: DC | PRN
  Administered 2017-01-01: 500 mL

## 2017-01-01 MED ORDER — EPHEDRINE SULFATE 50 MG/ML IJ SOLN
INTRAMUSCULAR | Status: DC | PRN
  Administered 2017-01-01 (×2): 5 mg via INTRAVENOUS

## 2017-01-01 MED ORDER — THROMBIN 5000 UNITS EX SOLR
CUTANEOUS | Status: AC
  Filled 2017-01-01: qty 5000

## 2017-01-01 MED ORDER — FENTANYL CITRATE (PF) 250 MCG/5ML IJ SOLN
INTRAMUSCULAR | Status: AC
  Filled 2017-01-01: qty 5

## 2017-01-01 MED ORDER — THROMBIN 20000 UNITS EX SOLR
CUTANEOUS | Status: AC
  Filled 2017-01-01: qty 20000

## 2017-01-01 MED ORDER — LISINOPRIL 20 MG PO TABS
20.0000 mg | ORAL_TABLET | Freq: Every day | ORAL | Status: DC
  Administered 2017-01-02: 20 mg via ORAL
  Filled 2017-01-01: qty 1

## 2017-01-01 MED ORDER — METHOCARBAMOL 1000 MG/10ML IJ SOLN
500.0000 mg | Freq: Four times a day (QID) | INTRAVENOUS | Status: DC | PRN
  Filled 2017-01-01: qty 5

## 2017-01-01 MED ORDER — PHENYLEPHRINE HCL 10 MG/ML IJ SOLN
INTRAVENOUS | Status: DC | PRN
  Administered 2017-01-01: 50 ug/min via INTRAVENOUS

## 2017-01-01 MED ORDER — ACETAMINOPHEN 325 MG PO TABS: 650.0000 mg | ORAL_TABLET | ORAL | Status: DC | PRN

## 2017-01-01 MED ORDER — ONDANSETRON HCL 4 MG/2ML IJ SOLN: 4.0000 mg | Freq: Four times a day (QID) | INTRAMUSCULAR | Status: DC | PRN

## 2017-01-01 MED ORDER — MIDAZOLAM HCL 2 MG/2ML IJ SOLN
INTRAMUSCULAR | Status: AC
  Filled 2017-01-01: qty 2

## 2017-01-01 MED ORDER — LACTATED RINGERS IV SOLN
INTRAVENOUS | Status: DC
  Administered 2017-01-01 (×2): via INTRAVENOUS
  Administered 2017-01-01: 50 mL/h via INTRAVENOUS

## 2017-01-01 MED ORDER — METHOCARBAMOL 500 MG PO TABS
500.0000 mg | ORAL_TABLET | Freq: Four times a day (QID) | ORAL | Status: DC | PRN
  Administered 2017-01-01 – 2017-01-02 (×3): 500 mg via ORAL
  Filled 2017-01-01 (×3): qty 1

## 2017-01-01 MED ORDER — PHENYLEPHRINE HCL 10 MG/ML IJ SOLN
INTRAMUSCULAR | Status: DC | PRN
  Administered 2017-01-01 (×4): 80 ug via INTRAVENOUS

## 2017-01-01 MED ORDER — DEXAMETHASONE SODIUM PHOSPHATE 10 MG/ML IJ SOLN
10.0000 mg | INTRAMUSCULAR | Status: AC
  Administered 2017-01-01: 10 mg via INTRAVENOUS

## 2017-01-01 MED ORDER — BUPIVACAINE HCL (PF) 0.5 % IJ SOLN
INTRAMUSCULAR | Status: DC | PRN
  Administered 2017-01-01: 14 mL

## 2017-01-01 MED ORDER — ACETAMINOPHEN 650 MG RE SUPP: 650.0000 mg | RECTAL | Status: DC | PRN

## 2017-01-01 MED ORDER — PHENOL 1.4 % MT LIQD: 1.0000 | OROMUCOSAL | Status: DC | PRN

## 2017-01-01 MED ORDER — FENTANYL CITRATE (PF) 100 MCG/2ML IJ SOLN
INTRAMUSCULAR | Status: DC | PRN
  Administered 2017-01-01 (×5): 50 ug via INTRAVENOUS

## 2017-01-01 MED ORDER — VANCOMYCIN HCL IN DEXTROSE 1-5 GM/200ML-% IV SOLN
INTRAVENOUS | Status: AC
Start: 2017-01-01 — End: 2017-01-01
  Filled 2017-01-01: qty 200

## 2017-01-01 MED ORDER — PROPOFOL 10 MG/ML IV BOLUS
INTRAVENOUS | Status: AC
  Filled 2017-01-01: qty 20

## 2017-01-01 MED ORDER — PROPOFOL 10 MG/ML IV BOLUS
INTRAVENOUS | Status: DC | PRN
  Administered 2017-01-01: 160 mg via INTRAVENOUS

## 2017-01-01 MED ORDER — ONDANSETRON HCL 4 MG/2ML IJ SOLN
INTRAMUSCULAR | Status: DC | PRN
  Administered 2017-01-01: 4 mg via INTRAVENOUS

## 2017-01-01 MED ORDER — HYDROMORPHONE HCL 2 MG PO TABS
2.0000 mg | ORAL_TABLET | ORAL | Status: DC | PRN
  Administered 2017-01-01 – 2017-01-02 (×5): 2 mg via ORAL
  Filled 2017-01-01 (×5): qty 1

## 2017-01-01 MED ORDER — PROMETHAZINE HCL 25 MG/ML IJ SOLN: 6.2500 mg | INTRAMUSCULAR | Status: DC | PRN

## 2017-01-01 MED ORDER — SUGAMMADEX SODIUM 200 MG/2ML IV SOLN
INTRAVENOUS | Status: DC | PRN
  Administered 2017-01-01: 170.6 mg via INTRAVENOUS

## 2017-01-01 MED ORDER — SODIUM CHLORIDE 0.9% FLUSH
3.0000 mL | Freq: Two times a day (BID) | INTRAVENOUS | Status: DC
  Administered 2017-01-01: 3 mL via INTRAVENOUS

## 2017-01-01 MED ORDER — THROMBIN 20000 UNITS EX SOLR
CUTANEOUS | Status: DC | PRN
  Administered 2017-01-01: 20 mL via TOPICAL

## 2017-01-01 MED ORDER — HYDROMORPHONE HCL 1 MG/ML IJ SOLN
0.2500 mg | INTRAMUSCULAR | Status: DC | PRN
  Administered 2017-01-01: 0.5 mg via INTRAVENOUS

## 2017-01-01 MED ORDER — MIDAZOLAM HCL 5 MG/5ML IJ SOLN
INTRAMUSCULAR | Status: DC | PRN
  Administered 2017-01-01: 2 mg via INTRAVENOUS

## 2017-01-01 MED ORDER — ACETAMINOPHEN 10 MG/ML IV SOLN
INTRAVENOUS | Status: AC
  Filled 2017-01-01: qty 100

## 2017-01-01 SURGICAL SUPPLY — 67 items
BAG DECANTER FOR FLEXI CONT (MISCELLANEOUS) ×2 IMPLANT
BASKET BONE COLLECTION (BASKET) ×2 IMPLANT
BENZOIN TINCTURE PRP APPL 2/3 (GAUZE/BANDAGES/DRESSINGS) ×2 IMPLANT
BLADE CLIPPER SURG (BLADE) IMPLANT
BUR MATCHSTICK NEURO 3.0 LAGG (BURR) ×2 IMPLANT
CAGE COROENT PLIF 10X28-8 LUMB (Cage) ×4 IMPLANT
CANISTER SUCT 3000ML PPV (MISCELLANEOUS) ×2 IMPLANT
CAP RELINE MOD TULIP RMM (Cap) ×4 IMPLANT
CARTRIDGE OIL MAESTRO DRILL (MISCELLANEOUS) ×1 IMPLANT
CONT SPEC 4OZ CLIKSEAL STRL BL (MISCELLANEOUS) ×2 IMPLANT
COVER BACK TABLE 24X17X13 BIG (DRAPES) IMPLANT
COVER BACK TABLE 60X90IN (DRAPES) ×2 IMPLANT
DERMABOND ADVANCED (GAUZE/BANDAGES/DRESSINGS) ×1
DERMABOND ADVANCED .7 DNX12 (GAUZE/BANDAGES/DRESSINGS) ×1 IMPLANT
DIFFUSER DRILL AIR PNEUMATIC (MISCELLANEOUS) ×2 IMPLANT
DRAPE C-ARM 42X72 X-RAY (DRAPES) ×2 IMPLANT
DRAPE C-ARMOR (DRAPES) ×2 IMPLANT
DRAPE LAPAROTOMY 100X72X124 (DRAPES) ×2 IMPLANT
DRAPE POUCH INSTRU U-SHP 10X18 (DRAPES) ×2 IMPLANT
DRAPE SURG 17X23 STRL (DRAPES) ×2 IMPLANT
DRSG OPSITE POSTOP 4X6 (GAUZE/BANDAGES/DRESSINGS) ×2 IMPLANT
DURAPREP 26ML APPLICATOR (WOUND CARE) ×2 IMPLANT
ELECT REM PT RETURN 9FT ADLT (ELECTROSURGICAL) ×2
ELECTRODE REM PT RTRN 9FT ADLT (ELECTROSURGICAL) ×1 IMPLANT
EVACUATOR 1/8 PVC DRAIN (DRAIN) IMPLANT
GAUZE SPONGE 4X4 16PLY XRAY LF (GAUZE/BANDAGES/DRESSINGS) IMPLANT
GLOVE BIO SURGEON STRL SZ7 (GLOVE) ×2 IMPLANT
GLOVE BIO SURGEON STRL SZ8 (GLOVE) ×4 IMPLANT
GLOVE BIOGEL PI IND STRL 6.5 (GLOVE) ×1 IMPLANT
GLOVE BIOGEL PI IND STRL 7.0 (GLOVE) ×3 IMPLANT
GLOVE BIOGEL PI IND STRL 7.5 (GLOVE) ×1 IMPLANT
GLOVE BIOGEL PI IND STRL 8.5 (GLOVE) ×1 IMPLANT
GLOVE BIOGEL PI INDICATOR 6.5 (GLOVE) ×1
GLOVE BIOGEL PI INDICATOR 7.0 (GLOVE) ×3
GLOVE BIOGEL PI INDICATOR 7.5 (GLOVE) ×1
GLOVE BIOGEL PI INDICATOR 8.5 (GLOVE) ×1
GLOVE ECLIPSE 8.5 STRL (GLOVE) ×2 IMPLANT
GLOVE SURG SS PI 6.5 STRL IVOR (GLOVE) ×4 IMPLANT
GOWN STRL REUS W/ TWL LRG LVL3 (GOWN DISPOSABLE) ×2 IMPLANT
GOWN STRL REUS W/ TWL XL LVL3 (GOWN DISPOSABLE) ×2 IMPLANT
GOWN STRL REUS W/TWL 2XL LVL3 (GOWN DISPOSABLE) ×4 IMPLANT
GOWN STRL REUS W/TWL LRG LVL3 (GOWN DISPOSABLE) ×2
GOWN STRL REUS W/TWL XL LVL3 (GOWN DISPOSABLE) ×2
HEMOSTAT POWDER KIT SURGIFOAM (HEMOSTASIS) ×2 IMPLANT
KIT BASIN OR (CUSTOM PROCEDURE TRAY) ×2 IMPLANT
KIT ROOM TURNOVER OR (KITS) ×2 IMPLANT
NEEDLE HYPO 25X1 1.5 SAFETY (NEEDLE) ×2 IMPLANT
NS IRRIG 1000ML POUR BTL (IV SOLUTION) ×2 IMPLANT
OIL CARTRIDGE MAESTRO DRILL (MISCELLANEOUS) ×2
PACK LAMINECTOMY NEURO (CUSTOM PROCEDURE TRAY) ×2 IMPLANT
PAD ARMBOARD 7.5X6 YLW CONV (MISCELLANEOUS) ×10 IMPLANT
PUTTY BONE ATTRAX 5CC STRIP (Putty) ×2 IMPLANT
ROD RELINE COCR LORD 5.0X35 (Rod) ×4 IMPLANT
SCREW LOCK RSS 4.5/5.0MM (Screw) ×8 IMPLANT
SCREW RELINE RMM 5.5X35 4S (Screw) ×4 IMPLANT
SHANK RELINE MOD 5.5X40 (Screw) ×4 IMPLANT
SPONGE LAP 4X18 X RAY DECT (DISPOSABLE) IMPLANT
SPONGE SURGIFOAM ABS GEL 100 (HEMOSTASIS) ×2 IMPLANT
STRIP CLOSURE SKIN 1/2X4 (GAUZE/BANDAGES/DRESSINGS) ×4 IMPLANT
SUT VIC AB 0 CT1 18XCR BRD8 (SUTURE) ×1 IMPLANT
SUT VIC AB 0 CT1 8-18 (SUTURE) ×1
SUT VIC AB 2-0 CP2 18 (SUTURE) ×2 IMPLANT
SUT VIC AB 3-0 SH 8-18 (SUTURE) ×4 IMPLANT
TOWEL GREEN STERILE (TOWEL DISPOSABLE) ×2 IMPLANT
TOWEL GREEN STERILE FF (TOWEL DISPOSABLE) ×2 IMPLANT
TRAY FOLEY W/METER SILVER 16FR (SET/KITS/TRAYS/PACK) ×2 IMPLANT
WATER STERILE IRR 1000ML POUR (IV SOLUTION) ×2 IMPLANT

## 2017-01-01 NOTE — Progress Notes (Signed)
Pharmacy Antibiotic Note  Whitney Lewis is a 56 y.o. female admitted on 01/01/2017 for L4-5 back surgery and need for surgical prophylaxis.  Pharmacy has been consulted for Vancomycin dosing. Patient has closed incision without a drain therefore only single dose of Vancomycin needed per protocol. Pre-operative dose of Vancomycin was given 01/01/17 at 13:52PM.   Plan: Vancomycin 1000mg  IV x1 on 01/02/17 at 0200 AM.  No further doses due to no drain.  Pharmacy will sign off consult.   Height: 5\' 7"  (170.2 cm) Weight: 188 lb (85.3 kg) IBW/kg (Calculated) : 61.6  Temp (24hrs), Avg:98.4 F (36.9 C), Min:98.3 F (36.8 C), Max:98.4 F (36.9 C)   Recent Labs Lab 12/29/16 1540  WBC 7.4  CREATININE 0.73    Estimated Creatinine Clearance: 87.1 mL/min (by C-G formula based on SCr of 0.73 mg/dL).    Allergies  Allergen Reactions  . Cefazolin Rash    Quick onset after 2nd dose of cefazolin, full body rash, no mucosal involvement  . Codeine Nausea Only  . Oxycodone-Acetaminophen Nausea Only    Antimicrobials this admission: Vancomycin 8/10 >>8/11  Dose adjustments this admission:   Microbiology results: None  Thank you for allowing pharmacy to be a part of this patient's care.  Sloan Leiter, PharmD, BCPS Clinical Pharmacist Clinical phone 01/01/2017 until 11PM [phone] After hours, please call #[phone] 01/01/2017 4:05 PM

## 2017-01-01 NOTE — Op Note (Signed)
01/01/2017  2:39 PM  PATIENT:  Whitney Lewis  56 y.o. female  PRE-OPERATIVE DIAGNOSIS:  Degenerative spondylolisthesis, synovial cyst formation, severe spinal stenosis. back and leg pain  POST-OPERATIVE DIAGNOSIS:  same  PROCEDURE:   1. Decompressive lumbar laminectomy L4-5 for resection of bilateral synovial cysts requiring more work than would be required for a simple exposure of the disk for PLIF in order to adequately decompress the neural elements and address the spinal stenosis 2. Posterior lumbar interbody fusion L4-5 using peek interbody cages packed with morcellized allograft and autograft 3. Posterior fixation l4-5 using nuvasive cortical pedicle screws.  4. Intertransverse arthrodesis L4-5 using morcellized autograft and allograft.  SURGEON:  Sherley Bounds, MD  ASSISTANTS: DR Ellene Route  ANESTHESIA:  General  EBL: 250 ml  Total I/O In: 1300 [I.V.:1300] Out: 430 [Urine:180; Blood:250]  BLOOD ADMINISTERED:none  DRAINS: none   INDICATION FOR PROCEDURE: This patient presented with back and leg pain. Imaging revealed spondylolisthesis L4-5 with bilateral synovial cysts causing severe spinal stenosis. The patient tried a reasonable attempt at conservative medical measures without relief. I recommended decompression and instrumented fusion to address the stenosis as well as the segmental  instability.  Patient understood the risks, benefits, and alternatives and potential outcomes and wished to proceed.  PROCEDURE DETAILS:  The patient was brought to the operating room. After induction of generalized endotracheal anesthesia the patient was rolled into the prone position on chest rolls and all pressure points were padded. The patient's lumbar region was cleaned and then prepped with DuraPrep and draped in the usual sterile fashion. Anesthesia was injected and then a dorsal midline incision was made and carried down to the lumbosacral fascia. The fascia was opened and the  paraspinous musculature was taken down in a subperiosteal fashion to expose L4-5. A self-retaining retractor was placed. Intraoperative fluoroscopy confirmed my level, and I started with placement of the L4 cortical pedicle screws. The pedicle screw entry zones were identified utilizing surface landmarks and  AP and lateral fluoroscopy. I scored the cortex with the high-speed drill and then used the hand drill to drill an upward and outward direction into the pedicle. I then tapped line to line. I then placed a 5.5 x 40 mm cortical pedicle screw into the pedicles of L4 bilaterally. I then turned my attention to the decompression and complete lumbar laminectomies, hemi- facetectomies, and foraminotomies were performed at L4-5. The patient had significant spinal stenosis and this required more work than would be required for a simple exposure of the disc for posterior lumbar interbody fusion which would only require a limited laminotomy. Much more generous decompression and generous foraminotomy was undertaken in order to adequately decompress the neural elements and address the patient's leg pain. He had bilateral synovial cysts which were quite adherent to the dura.We did not see an unintended durotomy at any time. The yellow ligament was removed to expose the underlying dura and nerve roots, and generous foraminotomies were performed to adequately decompress the neural elements. Both the exiting and traversing nerve roots were decompressed on both sides until a coronary dilator passed easily along the nerve roots. Once the decompression was complete, I turned my attention to the posterior lower lumbar interbody fusion. The epidural venous vasculature was coagulated and cut sharply. Disc space was incised and the initial discectomy was performed with pituitary rongeurs. The disc space was distracted with sequential distractors to a height of 10 mm. We then used a series of scrapers and shavers to prepare the  endplates for fusion. The midline was prepared with Epstein curettes. Once the complete discectomy was finished, we packed an appropriate sized interbody cage with local autograft and morcellized allograft, gently retracted the nerve root, and tapped the cage into position at L4-5.  The midline between the cages was packed with morselized autograft and allograft. We then turned our attention to the placement of the lower pedicle screws. The pedicle screw entry zones were identified utilizing surface landmarks and fluoroscopy. I drilled into each pedicle utilizing the hand drill, and tapped each pedicle with the appropriate tap. We palpated with a ball probe to assure no break in the cortex. We then placed 5.5 x 35 mm cortical pedicle screws into the pedicles bilaterally at L5. We then decorticated the transverse processes and laid a mixture of morcellized autograft and allograft out over these to perform intertransverse arthrodesis at L4-5. We then placed lordotic rods into the multiaxial screw heads of the pedicle screws and locked these in position with the locking caps and anti-torque device. We then checked our construct with AP and lateral fluoroscopy. Irrigated with copious amounts of bacitracin-containing saline solution. Inspected the nerve roots once again to assure adequate decompression, lined to the dura with Gelfoam, placed powdered vancomycin into the wound, and closed the muscle and the fascia with 0 Vicryl. Closed the subcutaneous tissues with 2-0 Vicryl and subcuticular tissues with 3-0 Vicryl. The skin was closed with benzoin and Steri-Strips. Dressing was then applied, the patient was awakened from general anesthesia and transported to the recovery room in stable condition. At the end of the procedure all sponge, needle and instrument counts were correct.   PLAN OF CARE: admit to inpatient  PATIENT DISPOSITION:  PACU - hemodynamically stable.   Delay start of Pharmacological VTE agent  (>24hrs) due to surgical blood loss or risk of bleeding:  yes

## 2017-01-01 NOTE — Anesthesia Procedure Notes (Addendum)
Procedure Name: Intubation Date/Time: 01/01/2017 11:22 AM Performed by: Clearnce Sorrel Pre-anesthesia Checklist: Patient identified, Emergency Drugs available, Suction available, Patient being monitored and Timeout performed Patient Re-evaluated:Patient Re-evaluated prior to induction Oxygen Delivery Method: Circle system utilized Preoxygenation: Pre-oxygenation with 100% oxygen Induction Type: IV induction Ventilation: Mask ventilation without difficulty Laryngoscope Size: Mac and 3 Grade View: Grade III Tube type: Oral Tube size: 7.0 mm Number of attempts: 2 Airway Equipment and Method: Stylet Placement Confirmation: ETT inserted through vocal cords under direct vision,  positive ETCO2 and breath sounds checked- equal and bilateral Secured at: 22 cm Tube secured with: Tape Dental Injury: Teeth and Oropharynx as per pre-operative assessment

## 2017-01-01 NOTE — Anesthesia Preprocedure Evaluation (Signed)
Anesthesia Evaluation  Patient identified by MRN, date of birth, ID band Patient awake    Reviewed: Allergy & Precautions, NPO status , Patient's Chart, lab work & pertinent test results  Airway Mallampati: II  TM Distance: >3 FB Neck ROM: Full    Dental   Pulmonary sleep apnea ,    breath sounds clear to auscultation       Cardiovascular hypertension, Pt. on medications  Rhythm:Regular Rate:Normal     Neuro/Psych  Headaches, Anxiety Depression Bipolar Disorder    GI/Hepatic Neg liver ROS, GERD  ,  Endo/Other  negative endocrine ROS  Renal/GU negative Renal ROS     Musculoskeletal  (+) Arthritis , Fibromyalgia -  Abdominal   Peds  Hematology   Anesthesia Other Findings   Reproductive/Obstetrics                             Anesthesia Physical  Anesthesia Plan  ASA: III  Anesthesia Plan: General   Post-op Pain Management:    Induction: Intravenous  PONV Risk Score and Plan: 3 and Ondansetron, Dexamethasone, Midazolam and Treatment may vary due to age or medical condition  Airway Management Planned: Oral ETT  Additional Equipment:   Intra-op Plan:   Post-operative Plan: Extubation in OR  Informed Consent: I have reviewed the patients History and Physical, chart, labs and discussed the procedure including the risks, benefits and alternatives for the proposed anesthesia with the patient or authorized representative who has indicated his/her understanding and acceptance.   Dental advisory given  Plan Discussed with: Anesthesiologist, CRNA and Surgeon  Anesthesia Plan Comments:         Anesthesia Quick Evaluation

## 2017-01-01 NOTE — H&P (Signed)
Subjective: Patient is a 56 y.o. female admitted for PLIF. Onset of symptoms was several months ago, gradually worsening since that time.  The pain is rated severe, and is located at the across the lower back and radiates to legs. The pain is described as aching and occurs all day. The symptoms have been progressive. Symptoms are exacerbated by exercise. MRI or CT showed spondylolisthesis with stenosis L4-5, previous surgery   Past Medical History:  Diagnosis Date  . Anxiety   . Arthritis    feet, knees (07/04/2015)  . Bipolar disorder ([place])   . Complication of anesthesia    hard to wake up, blood pressure drops   . Depression   . Family history of adverse reaction to anesthesia    "sister hard to wake up also"  . Fibromyalgia dx'd ~ 2000  . GERD (gastroesophageal reflux disease)   . Hyperlipidemia   . Hypertension   . Migraine    "hormonal; stopped when I was 16" (07/04/2015)  . OSA (obstructive sleep apnea)    wears mouth piece, no CPAP    Past Surgical History:  Procedure Laterality Date  . ABDOMINAL HYSTERECTOMY  ~ 1993  . BACK SURGERY    . CESAREAN SECTION  1987  . COLONOSCOPY    . HAMMER TOE SURGERY Right 03/2015  . HAMMER TOE SURGERY Left 06/06/2015   Procedure: LEFT TWO-THREE  HAMMER TOE CORRECTION ;  Surgeon: [name], MD;  Location: [facility];  Service: Orthopedics;  Laterality: Left;  . INCISION AND DRAINAGE OF WOUND Right 07/04/2015   Procedure: IRRIGATION AND DEBRIDEMENT OF RIGHT FOOT DEEP ABSCESS; EXCISION OF RIGHT SECOND METATARSAL HEAD; REMOVAL OF DEEP IMPLANT RIGHT FOOT; EXCISION OF RIGHT SECOND TOE PROXIMAL PHALANX BASE; INSERTION OF ANTIBIOTIC BEADS INTO RIGHT FOOT ABSCESS;  Surgeon: [name], MD;  Location: [facility];  Service: Orthopedics;  Laterality: Right;  SITE ALSO  RIGHT FOOT  . IRRIGATION AND DEBRIDEMENT FOOT Right 07/04/2015   Irrigation and excisional debridement of right foot deep abscess;; Insertion of antibiotic beads into the right foot  abscess  . JOINT REPLACEMENT    . TOTAL KNEE ARTHROPLASTY Left   . WEIL OSTEOTOMY Left 06/06/2015   Procedure: LEFT TWO AND THREE METATARSAL WEIL OSTEOTOMY ;  Surgeon: [name], MD;  Location: [facility];  Service: Orthopedics;  Laterality: Left;    Prior to Admission medications   Medication Sig Start Date End Date Taking? Authorizing Provider  amphetamine-dextroamphetamine (ADDERALL) 30 MG tablet Take 30 mg by mouth 3 (three) times daily as needed.    Yes [provider]  ARIPiprazole (ABILIFY) 15 MG tablet Take 15 mg by mouth daily.    Yes [provider]  DULoxetine (CYMBALTA) 60 MG capsule Take 120 mg by mouth every evening.    Yes [provider]  lisinopril (PRINIVIL,ZESTRIL) 20 MG tablet Take 20 mg by mouth daily. Reported on 08/13/2015   Yes [provider]  methocarbamol (ROBAXIN) 500 MG tablet Take 500 mg by mouth every 6 (six) hours as needed for muscle spasms.   Yes [provider]  Multiple Vitamin (MULTIVITAMIN WITH MINERALS) TABS tablet Take 1 tablet by mouth every other day.    Yes [provider]  simvastatin (ZOCOR) 20 MG tablet Take 20 mg by mouth at bedtime.    Yes [provider]  traMADol (ULTRAM) 50 MG tablet Take 50 mg by mouth every 6 (six) hours as needed for moderate pain.   Yes [provider]  amoxicillin (AMOXIL) 500 MG capsule Take 2,000 mg by mouth See admin instructions. 1 hour prior to dental appointment    [provider]   Allergies  Allergen Reactions  . Cefazolin Rash    Quick onset after 2nd dose of cefazolin, full body rash, no mucosal involvement  . Codeine Nausea Only  . Oxycodone-Acetaminophen Nausea Only    Social History  Substance Use Topics  . Smoking status: Never Smoker  . Smokeless tobacco: Never Used  . Alcohol use 3.6 oz/week    6 Glasses of wine per week     Comment: occasional    Family History  Problem Relation Age of Onset  .  Heart disease Mother   . Breast cancer Neg Hx      Review of Systems  Positive ROS: neg  All other systems have been reviewed and were otherwise negative with the exception of those mentioned in the HPI and as above.  Objective: Vital signs in last 24 hours: Temp:  [98.4 F (36.9 C)] 98.4 F (36.9 C) (08/10 0918) Pulse Rate:  [82] 82 (08/10 0918) Resp:  [18] 18 (08/10 0918) BP: (100)/(71) 100/71 (08/10 0918) SpO2:  [100 %] 100 % (08/10 0918) Weight:  [85.3 kg (188 lb)] 85.3 kg (188 lb) (08/10 0918)  General Appearance: Alert, cooperative, no distress, appears stated age Head: Normocephalic, without obvious abnormality, atraumatic Eyes: PERRL, conjunctiva/corneas clear, EOM's intact    Neck: Supple, symmetrical, trachea midline Back: Symmetric, no curvature, ROM normal, no CVA tenderness Lungs:  respirations unlabored Heart: Regular rate and rhythm Abdomen: Soft, non-tender Extremities: Extremities normal, atraumatic, no cyanosis or edema Pulses: 2+ and symmetric all extremities Skin: Skin color, texture, turgor normal, no rashes or lesions  NEUROLOGIC:   Mental status: Alert and oriented x4,  no aphasia, good attention span, fund of knowledge, and memory Motor Exam - grossly normal Sensory Exam - grossly normal Reflexes: 1+ Coordination - grossly normal Gait - grossly normal Balance - grossly normal Cranial Nerves: I: smell Not tested  II: visual acuity  OS: nl    OD: nl  II: visual fields Full to confrontation  II: pupils Equal, round, reactive to light  III,VII: ptosis None  III,IV,VI: extraocular muscles  Full ROM  V: mastication Normal  V: facial light touch sensation  Normal  V,VII: corneal reflex  Present  VII: facial muscle function - upper  Normal  VII: facial muscle function - lower Normal  VIII: hearing Not tested  IX: soft palate elevation  Normal  IX,X: gag reflex Present  XI: trapezius strength  5/5  XI: sternocleidomastoid strength 5/5  XI:  neck flexion strength  5/5  XII: tongue strength  Normal    Data Review Lab Results  Component Value Date   WBC 7.4 12/29/2016   HGB 13.6 12/29/2016   HCT 38.9 12/29/2016   MCV 85.9 12/29/2016   PLT 253 12/29/2016   Lab Results  Component Value Date   NA 138 12/29/2016   K 4.2 12/29/2016   CL 101 12/29/2016   CO2 30 12/29/2016   BUN 11 12/29/2016   CREATININE 0.73 12/29/2016   GLUCOSE 93 12/29/2016   Lab Results  Component Value Date   INR 1.03 12/29/2016    Assessment/Plan: Patient admitted for PLIF L4-5. Patient has failed a reasonable attempt at conservative therapy.  I explained the condition and procedure to the patient and answered any questions.  Patient wishes to proceed with procedure as planned. Understands risks/ benefits and  typical outcomes of procedure.   Everhett Bozard S 01/01/2017 11:07 AM

## 2017-01-01 NOTE — Transfer of Care (Signed)
Immediate Anesthesia Transfer of Care Note  Patient: Whitney Lewis  Procedure(s) Performed: Procedure(s): L4-5 MAXIMUM ACCESS (MAS) POSTERIOR LUMBAR INTERBODY FUSION (PLIF) (N/A)  Patient Location: PACU  Anesthesia Type:General  Level of Consciousness: awake, alert  and oriented  Airway & Oxygen Therapy: Patient Spontanous Breathing and Patient connected to nasal cannula oxygen  Post-op Assessment: Report given to RN and Post -op Vital signs reviewed and stable  Post vital signs: Reviewed and stable  Last Vitals:  Vitals:   01/01/17 0918 01/01/17 1422  BP: 100/71 105/60  Pulse: 82 95  Resp: 18 18  Temp: 36.9 C 36.9 C  SpO2: 100% 100%    Last Pain:  Vitals:   01/01/17 0918  TempSrc: Oral         Complications: No apparent anesthesia complications

## 2017-01-02 MED ORDER — METHOCARBAMOL 500 MG PO TABS: 500.0000 mg | ORAL_TABLET | Freq: Four times a day (QID) | ORAL | 3 refills | Status: DC | PRN

## 2017-01-02 MED ORDER — HYDROMORPHONE HCL 2 MG PO TABS: 2.0000 mg | ORAL_TABLET | ORAL | 0 refills | Status: DC | PRN

## 2017-01-02 NOTE — Evaluation (Signed)
Occupational Therapy Evaluation Patient Details Name: Whitney Lewis MRN: [id number] DOB: Dec 30, 1960 Today's Date: 01/02/2017    History of Present Illness Pt is a 56 yo female admitted for a L4-L5 decompressive laminectomy with a fusion and fixation with arthrodesis due to degenerative spondylolithesis. PMH signifcant for axiety, depression, OA, bipolar, fibromyalgia, GERD, HLD, HTN, L TKA.    Clinical Impression   PTA, pt was living with her husband and was independent. Pt currently performing ADLs and functional mobility at a set-up and supervision level. Provided education on back precautions, donning/doffing back brace, UB ADLs, LB ADLs, and bed mobility. Pt demonstrating good understanding and adherance to back precautions throughout session. Recommend dc home once medically stable per physician. All acute OT needs met and will sign off.     Follow Up Recommendations  No OT follow up;Supervision - Intermittent    Equipment Recommendations  None recommended by OT    Recommendations for Other Services PT consult     Precautions / Restrictions Precautions Precautions: Back Precaution Booklet Issued: Yes (comment) Precaution Comments: Provided educated and handout on back precautions. Pt abel to verbalize 3/3 precautions and demonstrated understanding during ADLs.  Required Braces or Orthoses: Spinal Brace Spinal Brace: Lumbar corset;Applied in sitting position Restrictions Weight Bearing Restrictions: No      Mobility Bed Mobility Overal bed mobility: Independent             General bed mobility comments: Educated pt on log roll technique. Set up bed to simualte hoem with OOB and bedrails lowered. Pt demonstrating good technique  Transfers Overall transfer level: Modified independent               General transfer comment: able to stand from EOB with no hands on assistance, good technique    Balance Overall balance assessment: Modified Independent                                          ADL either performed or assessed with clinical judgement   ADL Overall ADL's : Needs assistance/impaired Eating/Feeding: Set up;Sitting   Grooming: Set up;Standing   Upper Body Bathing: Set up;Sitting   Lower Body Bathing: Set up;Supervison/ safety;Sit to/from stand   Upper Body Dressing : Set up;Sitting Upper Body Dressing Details (indicate cue type and reason): Educated pt on compensatory techniques with back precautions during UB dressing. Pt able to don bra, dress, and back brace with set up and Min VCs. Lower Body Dressing: Supervision/safety;Set up;Sit to/from stand;Cueing for back precautions;Cueing for compensatory techniques;Adhering to back precautions Lower Body Dressing Details (indicate cue type and reason): Pt donned underwear with set up and supervision. Educated pt on LB dressing techniques while adhereing to back precautions.  Pt demonstrating good adherance to back precautions. Educated on AE; pt stating she would rather perform ADLs without Toilet Transfer: Sales executive;Ambulation     Toileting - Clothing Manipulation Details (indicate cue type and reason): Educated pt on toilet hygiene while maintaining back precautions Tub/ Shower Transfer: Tub transfer;Supervision/safety;Cueing for safety;Ambulation Tub/Shower Transfer Details (indicate cue type and reason): Educated pt on safe tub transfer. Pt demosntrating understanding   General ADL Comments: Educated pt on back precautions, donning/doffing back brace, UB ADLs, LB ADLs, toileting, and tub transfers. Pt performign ADLs and fucntional mobility at supervision level     Vision         Perception  Praxis      Pertinent Vitals/Pain Pain Assessment: 0-10 Pain Score: 3  Pain Location: lumbar region Pain Descriptors / Indicators: Guarding;Sore;Operative site guarding Pain Intervention(s): Monitored during session;Repositioned      Hand Dominance Right   Extremity/Trunk Assessment Upper Extremity Assessment Upper Extremity Assessment: Overall WFL for tasks assessed   Lower Extremity Assessment Lower Extremity Assessment: Defer to PT evaluation   Cervical / Trunk Assessment Cervical / Trunk Assessment: Normal   Communication Communication Communication: No difficulties   Cognition Arousal/Alertness: Awake/alert Behavior During Therapy: WFL for tasks assessed/performed Overall Cognitive Status: Within Functional Limits for tasks assessed                                     General Comments       Exercises     Shoulder Instructions      Home Living Family/patient expects to be discharged to:: Private residence Living Arrangements: Spouse/significant other Available Help at Discharge: Family;Available 24 hours/day (Husband will be home 24/7 first week) Type of Home: House Home Access: Stairs to enter     Home Layout: Two level;Able to live on main level with bedroom/bathroom     Bathroom Shower/Tub: Tub/shower unit;Curtain   Biochemist, clinical: Standard     Home Equipment: Crutches          Prior Functioning/Environment Level of Independence: Independent                 OT Problem List: Decreased activity tolerance;Decreased safety awareness;Decreased knowledge of use of DME or AE;Decreased knowledge of precautions;Pain      OT Treatment/Interventions:      OT Goals(Current goals can be found in the care plan section) Acute Rehab OT Goals Patient Stated Goal: to get home OT Goal Formulation: With patient Time For Goal Achievement: 01/16/17 Potential to Achieve Goals: Good  OT Frequency:     Barriers to D/C:            Co-evaluation              AM-PAC PT "6 Clicks" Daily Activity     Outcome Measure Help from another person eating meals?: None Help from another person taking care of personal grooming?: None Help from another person toileting,  which includes using toliet, bedpan, or urinal?: A Little Help from another person bathing (including washing, rinsing, drying)?: A Little Help from another person to put on and taking off regular upper body clothing?: A Little Help from another person to put on and taking off regular lower body clothing?: A Little 6 Click Score: 20   End of Session Equipment Utilized During Treatment: Back brace Nurse Communication: Mobility status  Activity Tolerance: Patient tolerated treatment well Patient left: with call bell/phone within reach;in bed (At EOB)  OT Visit Diagnosis: Pain;Other (comment) (Back precautions) Pain - Right/Left:  (Back) Pain - part of body:  (Back)                Time: 1497-0263 OT Time Calculation (min): 19 min Charges:  OT General Charges $OT Visit: 1 Procedure OT Evaluation $OT Eval Low Complexity: 1 Procedure G-Codes:     Shaquisha Wynn MSOT, OTR/L Acute Rehab Pager: [phone] Office: [place] 01/02/2017, 11:33 AM

## 2017-01-02 NOTE — Care Management Note (Signed)
Case Management Note  Patient Details  Name: Whitney Lewis MRN: [id number] Date of Birth: 01-11-1961  Subjective/Objective:                 Spoke w staff. Patient with order to DC to home today. Chart reviewed. No Home Health or Equipment needs, no unacknowledged Case Management consults or medication needs identified at the time of this note. Plan for DC to home. If needs arise today prior to discharge, please call Carles Collet RN CM at [phone].    Action/Plan:   Expected Discharge Date:  01/02/17               Expected Discharge Plan:  Home/Self Care  In-House Referral:     Discharge planning Services  CM Consult  Post Acute Care Choice:    Choice offered to:     DME Arranged:    DME Agency:     HH Arranged:    HH Agency:     Status of Service:  Completed, signed off  If discussed at [employer] of Stay Meetings, dates discussed:    Additional Comments:  Carles Collet, RN 01/02/2017, 11:14 AM

## 2017-01-02 NOTE — Progress Notes (Signed)
Patient alert and oriented, mae's well, voiding adequate amount of urine, swallowing without difficulty,  c/o mild pain at time of discharge and medication given prior to discharged. Patient discharged home with family. Script and discharged instructions given to patient. Patient and family stated understanding of instructions given. Patient has an appointment with Dr. Ronnald Ramp

## 2017-01-02 NOTE — Evaluation (Signed)
Physical Therapy Evaluation Patient Details Name: Whitney Lewis MRN: [id number] DOB: 06/28/1960 Today's Date: 01/02/2017   History of Present Illness  Pt is a 56 yo female admitted for a L4-L5 decompressive laminectomy with a fusion and fixation with arthrodesis due to degenerative spondylolithesis. PMH signifcant for axiety, depression, OA, bipolar, fibromyalgia, GERD, HLD, HTN, L TKA.   Clinical Impression  Pt is POD 1 following the above procedure. Prior to admission, pt was completely independent and doing everything for herself including walking 4-5 mi a day. Pt requires Min guard to Mod I for all mobility this session and is expected to quickly progress to independent for all mobility. Pt will not require any further PT follow-up acutely nor at discharge as she is expected to return to baseline functioning with time. Please re-order if any new needs arise.     Follow Up Recommendations No PT follow up    Equipment Recommendations  None recommended by PT    Recommendations for Other Services       Precautions / Restrictions Precautions Precautions: Back Precaution Booklet Issued: No Precaution Comments: OT gave handout, reviewed precautions. Pt recalled 3/3 Required Braces or Orthoses: Spinal Brace Spinal Brace: Lumbar corset;Applied in sitting position Restrictions Weight Bearing Restrictions: No      Mobility  Bed Mobility Overal bed mobility: Independent             General bed mobility comments: able to get OOB without railing, No hands on assistance with proper technique  Transfers Overall transfer level: Modified independent               General transfer comment: able to stand from EOB with no hands on assistance, good technique  Ambulation/Gait Ambulation/Gait assistance: Modified independent (Device/Increase time) Ambulation Distance (Feet): 350 Feet Assistive device: None Gait Pattern/deviations: Step-through pattern;Decreased stride  length Gait velocity: decreased Gait velocity interpretation: Below normal speed for age/gender General Gait Details: slow, steady gait with good upright posture. Good sequencing and no LOB  Stairs Stairs: Yes Stairs assistance: Modified independent (Device/Increase time) Stair Management: One rail Right;Alternating pattern;Forwards Number of Stairs: 8 General stair comments: good sequencing and No LOB  Wheelchair Mobility    Modified Rankin (Stroke Patients Only)       Balance Overall balance assessment: Modified Independent                                           Pertinent Vitals/Pain Pain Assessment: 0-10 Pain Score: 3  Pain Location: lumbar region Pain Descriptors / Indicators: Guarding;Sore;Operative site guarding Pain Intervention(s): Monitored during session;Premedicated before session;Repositioned    Home Living Family/patient expects to be discharged to:: Private residence Living Arrangements: Spouse/significant other Available Help at Discharge: Family;Available 24 hours/day Type of Home: House Home Access: Stairs to enter     Home Layout: Two level;Able to live on main level with bedroom/bathroom Home Equipment: Crutches      Prior Function Level of Independence: Independent               Hand Dominance   Dominant Hand: Right    Extremity/Trunk Assessment   Upper Extremity Assessment Upper Extremity Assessment: Defer to OT evaluation    Lower Extremity Assessment Lower Extremity Assessment: Overall WFL for tasks assessed    Cervical / Trunk Assessment Cervical / Trunk Assessment: Normal  Communication   Communication: No difficulties  Cognition Arousal/Alertness: Awake/alert Behavior  During Therapy: WFL for tasks assessed/performed Overall Cognitive Status: Within Functional Limits for tasks assessed                                        General Comments      Exercises     Assessment/Plan     PT Assessment Patent does not need any further PT services  PT Problem List         PT Treatment Interventions      PT Goals (Current goals can be found in the Care Plan section)  Acute Rehab PT Goals Patient Stated Goal: to get home PT Goal Formulation: All assessment and education complete, DC therapy    Frequency     Barriers to discharge        Co-evaluation               AM-PAC PT "6 Clicks" Daily Activity  Outcome Measure Difficulty turning over in bed (including adjusting bedclothes, sheets and blankets)?: None Difficulty moving from lying on back to sitting on the side of the bed? : None Difficulty sitting down on and standing up from a chair with arms (e.g., wheelchair, bedside commode, etc,.)?: None Help needed moving to and from a bed to chair (including a wheelchair)?: None Help needed walking in hospital room?: None Help needed climbing 3-5 steps with a railing? : None 6 Click Score: 24    End of Session Equipment Utilized During Treatment: Gait belt;Back brace Activity Tolerance: Patient tolerated treatment well Patient left: in bed;with call bell/phone within reach;with family/visitor present;Other (comment) (sitting EOB with brace applied) Nurse Communication: Mobility status PT Visit Diagnosis: Difficulty in walking, not elsewhere classified (R26.2);Pain Pain - Right/Left:  (lower) Pain - part of body:  (lumbar)    Time: 6967-8938 PT Time Calculation (min) (ACUTE ONLY): 10 min   Charges:   PT Evaluation $PT Eval Moderate Complexity: 1 Mod     PT G Codes:   PT G-Codes **NOT FOR INPATIENT CLASS** Functional Assessment Tool Used: AM-PAC 6 Clicks Basic Mobility;Clinical judgement Functional Limitation: Mobility: Walking and moving around Mobility: Walking and Moving Around Current Status (B0175): 0 percent impaired, limited or restricted Mobility: Walking and Moving Around Goal Status (Z0258): 0 percent impaired, limited or  restricted Mobility: Walking and Moving Around Discharge Status [phone]): 0 percent impaired, limited or restricted    Scheryl Marten PT, DPT  [phone]   Shanon Rosser 01/02/2017, 9:32 AM

## 2017-01-02 NOTE — Discharge Summary (Signed)
Physician Discharge Summary  Patient ID: Whitney Lewis MRN: [id number] DOB/AGE: 1961-03-23 56 y.o.  Admit date: 01/01/2017 Discharge date: 01/02/2017  Admission Diagnoses:Spondylolisthesis L4-L5 with radiculopathy  Discharge Diagnoses: Spondylolisthesis L4-L5 with radiculopathy Active Problems:   S/P lumbar spinal fusion   Discharged Condition: good  Hospital Course: Patient underwent surgical decompression and stabilization and tolerated surgery well  Consults: None  Significant Diagnostic Studies: None  Treatments: surgery: Posterior lumbar interbody arthrodesis L4-L5 with pedicle screw fixation  Discharge Exam: Blood pressure 105/70, pulse (!) 109, temperature 98.7 F (37.1 C), resp. rate 20, height 5\' 7"  (1.702 m), weight 85.3 kg (188 lb), SpO2 97 %. Incision is clean and dry station and gait are intact.  Disposition: Discharge home  Discharge Instructions    Call MD for:  redness, tenderness, or signs of infection (pain, swelling, redness, odor or green/yellow discharge around incision site)    Complete by:  As directed    Call MD for:  severe uncontrolled pain    Complete by:  As directed    Call MD for:  temperature >100.4    Complete by:  As directed    Diet - low sodium heart healthy    Complete by:  As directed    Increase activity slowly    Complete by:  As directed      Allergies as of 01/02/2017      Reactions   Cefazolin Rash   Quick onset after 2nd dose of cefazolin, full body rash, no mucosal involvement   Codeine Nausea Only   Oxycodone-acetaminophen Nausea Only      Medication List    TAKE these medications   amoxicillin 500 MG capsule Commonly known as:  AMOXIL Take 2,000 mg by mouth See admin instructions. 1 hour prior to dental appointment   amphetamine-dextroamphetamine 30 MG tablet Commonly known as:  ADDERALL Take 30 mg by mouth 3 (three) times daily as needed.   ARIPiprazole 15 MG tablet Commonly known as:  ABILIFY Take 15 mg  by mouth daily.   DULoxetine 60 MG capsule Commonly known as:  CYMBALTA Take 120 mg by mouth every evening.   HYDROmorphone 2 MG tablet Commonly known as:  DILAUDID Take 1 tablet (2 mg total) by mouth every 4 (four) hours as needed for severe pain.   lisinopril 20 MG tablet Commonly known as:  PRINIVIL,ZESTRIL Take 20 mg by mouth daily. Reported on 08/13/2015   methocarbamol 500 MG tablet Commonly known as:  ROBAXIN Take 500 mg by mouth every 6 (six) hours as needed for muscle spasms. What changed:  Another medication with the same name was added. Make sure you understand how and when to take each.   methocarbamol 500 MG tablet Commonly known as:  ROBAXIN Take 1 tablet (500 mg total) by mouth every 6 (six) hours as needed for muscle spasms. What changed:  You were already taking a medication with the same name, and this prescription was added. Make sure you understand how and when to take each.   multivitamin with minerals Tabs tablet Take 1 tablet by mouth every other day.   simvastatin 20 MG tablet Commonly known as:  ZOCOR Take 20 mg by mouth at bedtime.   traMADol 50 MG tablet Commonly known as:  ULTRAM Take 50 mg by mouth every 6 (six) hours as needed for moderate pain.            Durable Medical Equipment        Start     Ordered  01/01/17 1552  DME Walker rolling  Once    Question:  Patient needs a walker to treat with the following condition  Answer:  S/P lumbar fusion   01/01/17 1551   01/01/17 1552  DME 3 n 1  Once     01/01/17 1551       Signed: Kristeen Miss J 01/02/2017, 7:14 AM

## 2017-01-03 NOTE — Anesthesia Postprocedure Evaluation (Signed)
Anesthesia Post Note  Patient: [place] L Stacks  Procedure(s) Performed: Procedure(s) (LRB): L4-5 MAXIMUM ACCESS (MAS) POSTERIOR LUMBAR INTERBODY FUSION (PLIF) (N/A)     Patient location during evaluation: PACU Anesthesia Type: General Level of consciousness: awake and alert Pain management: pain level controlled Vital Signs Assessment: post-procedure vital signs reviewed and stable Respiratory status: spontaneous breathing, nonlabored ventilation, respiratory function stable and patient connected to nasal cannula oxygen Cardiovascular status: blood pressure returned to baseline and stable Postop Assessment: no signs of nausea or vomiting Anesthetic complications: no    Last Vitals:  Vitals:   01/02/17 0400 01/02/17 0801  BP: 105/70 104/67  Pulse: (!) 109 88  Resp: 20 18  Temp: 37.1 C 36.6 C  SpO2: 97% 99%    Last Pain:  Vitals:   01/02/17 1026  TempSrc:   PainSc: [place]

## 2017-01-04 ENCOUNTER — Encounter (HOSPITAL_COMMUNITY): Payer: Self-pay | Admitting: Neurological Surgery

## 2017-02-09 IMAGING — MG MM DIGITAL SCREENING BILAT W/ CAD
4 series · 4 of 4 positions shown · non-contrast
Comparison: Previous exam(s).

CLINICAL DATA: Screening.

EXAM:
DIGITAL SCREENING BILATERAL MAMMOGRAM WITH CAD

[R CC]
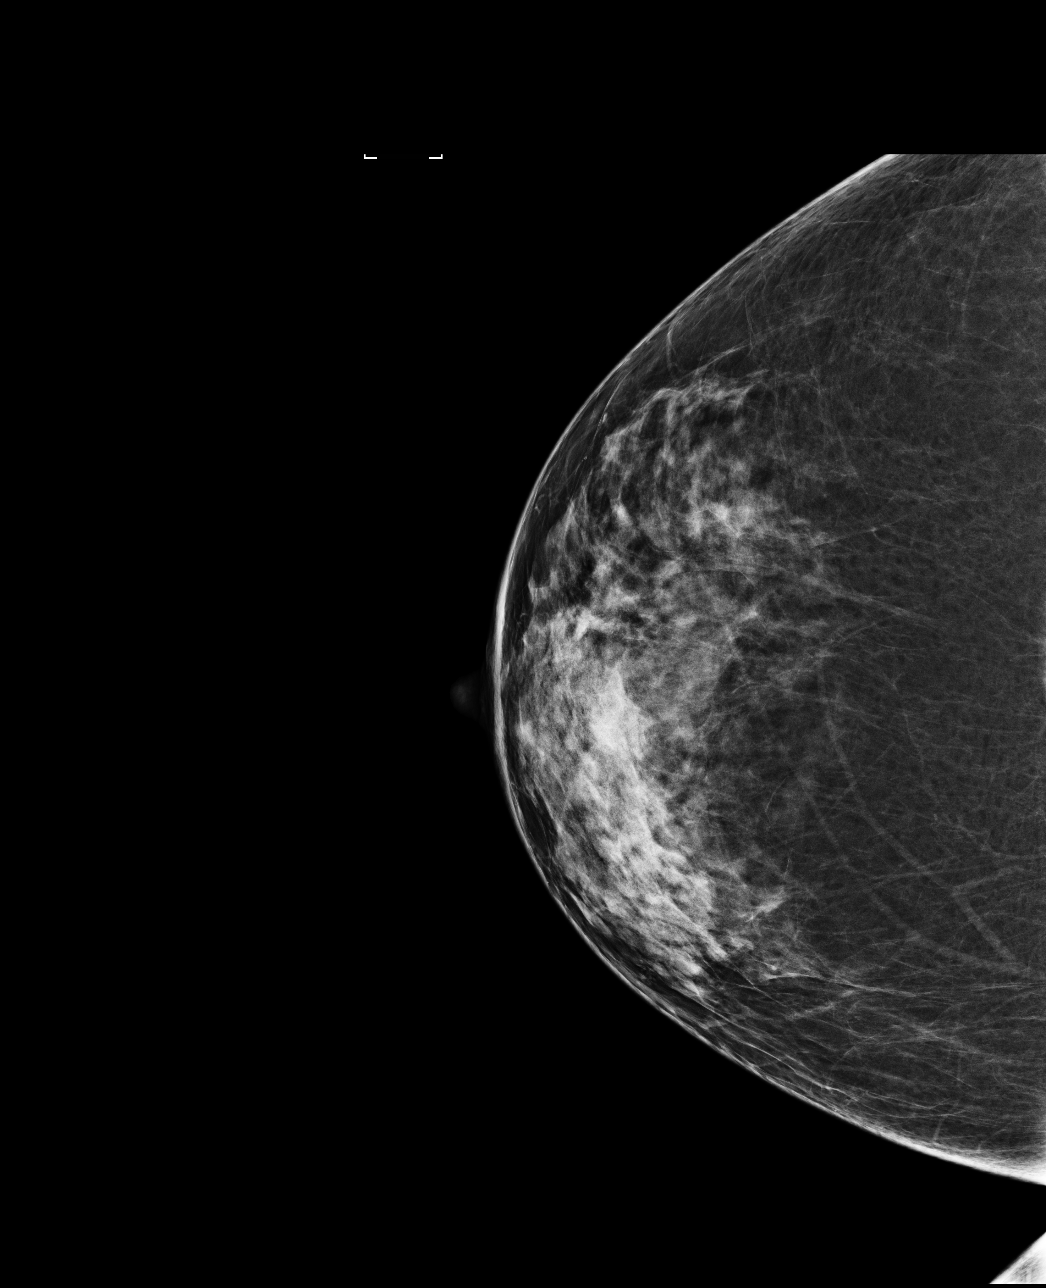

[L CC]
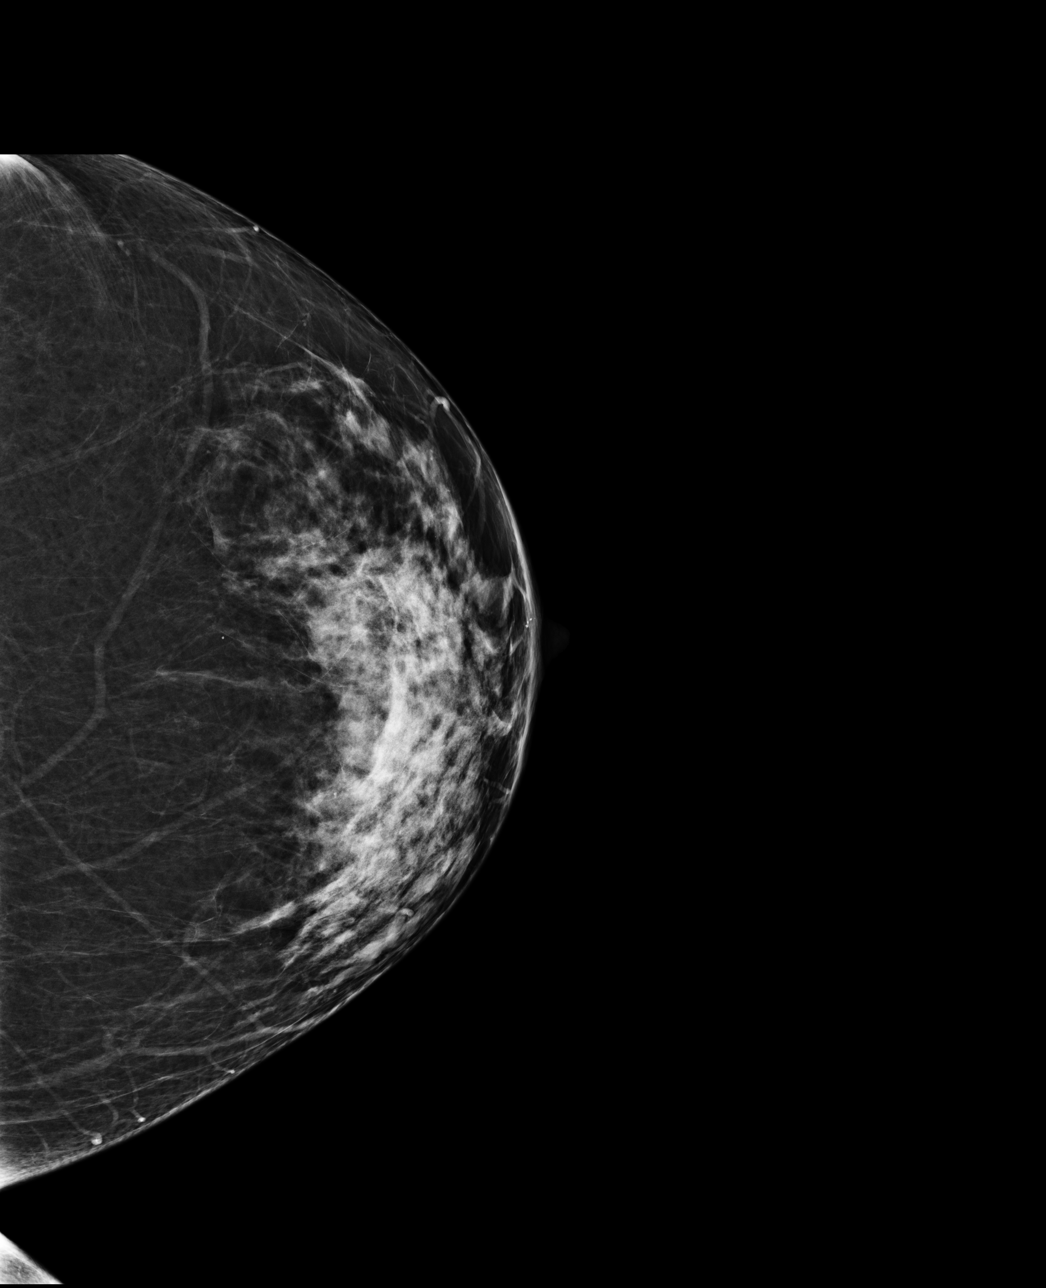

[L MLO]
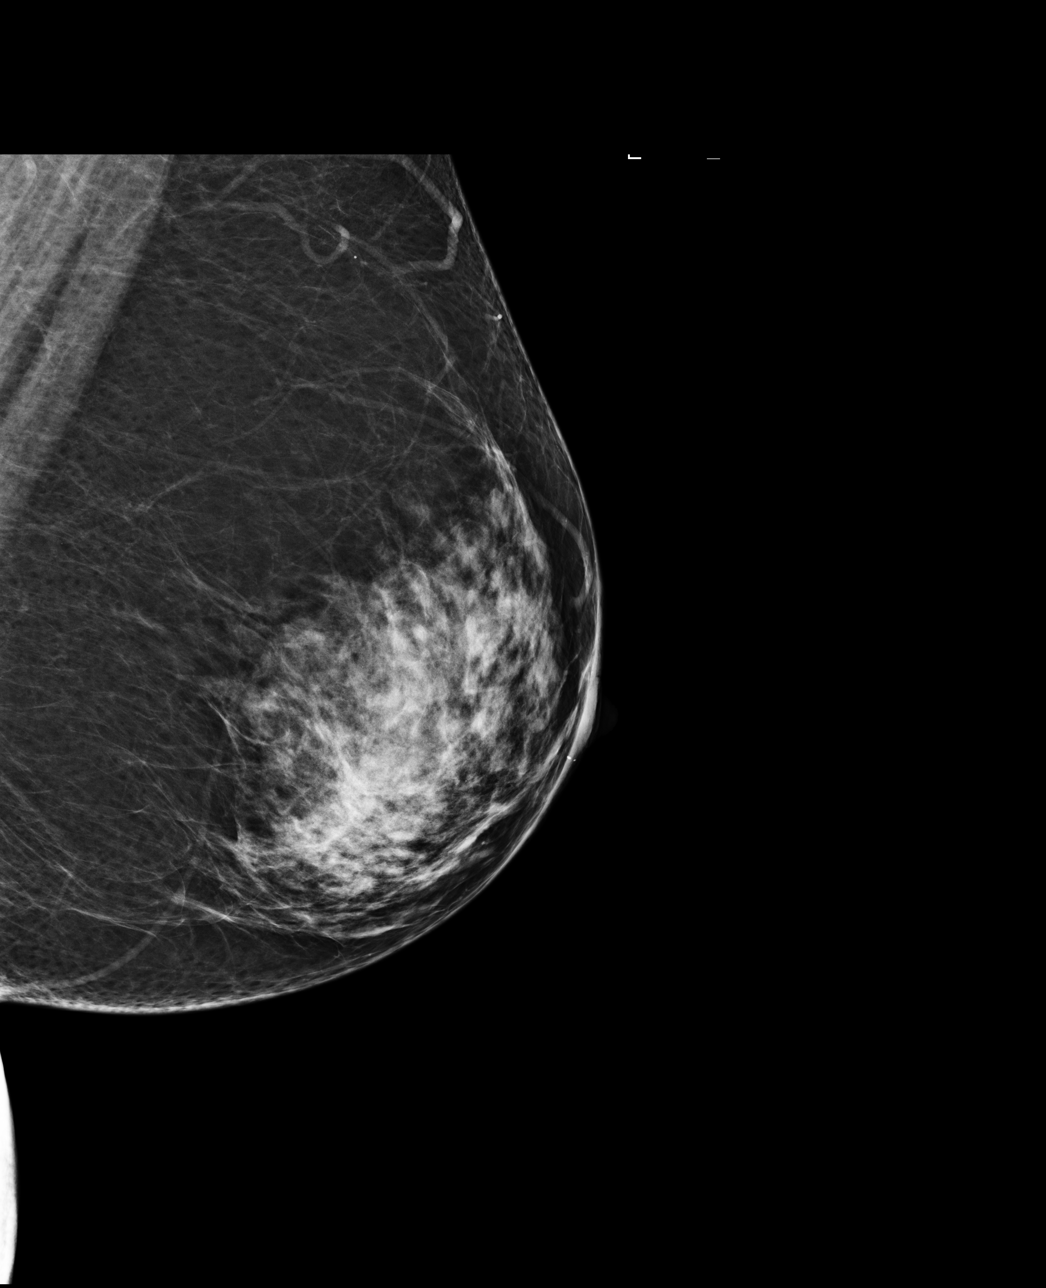

[R MLO]
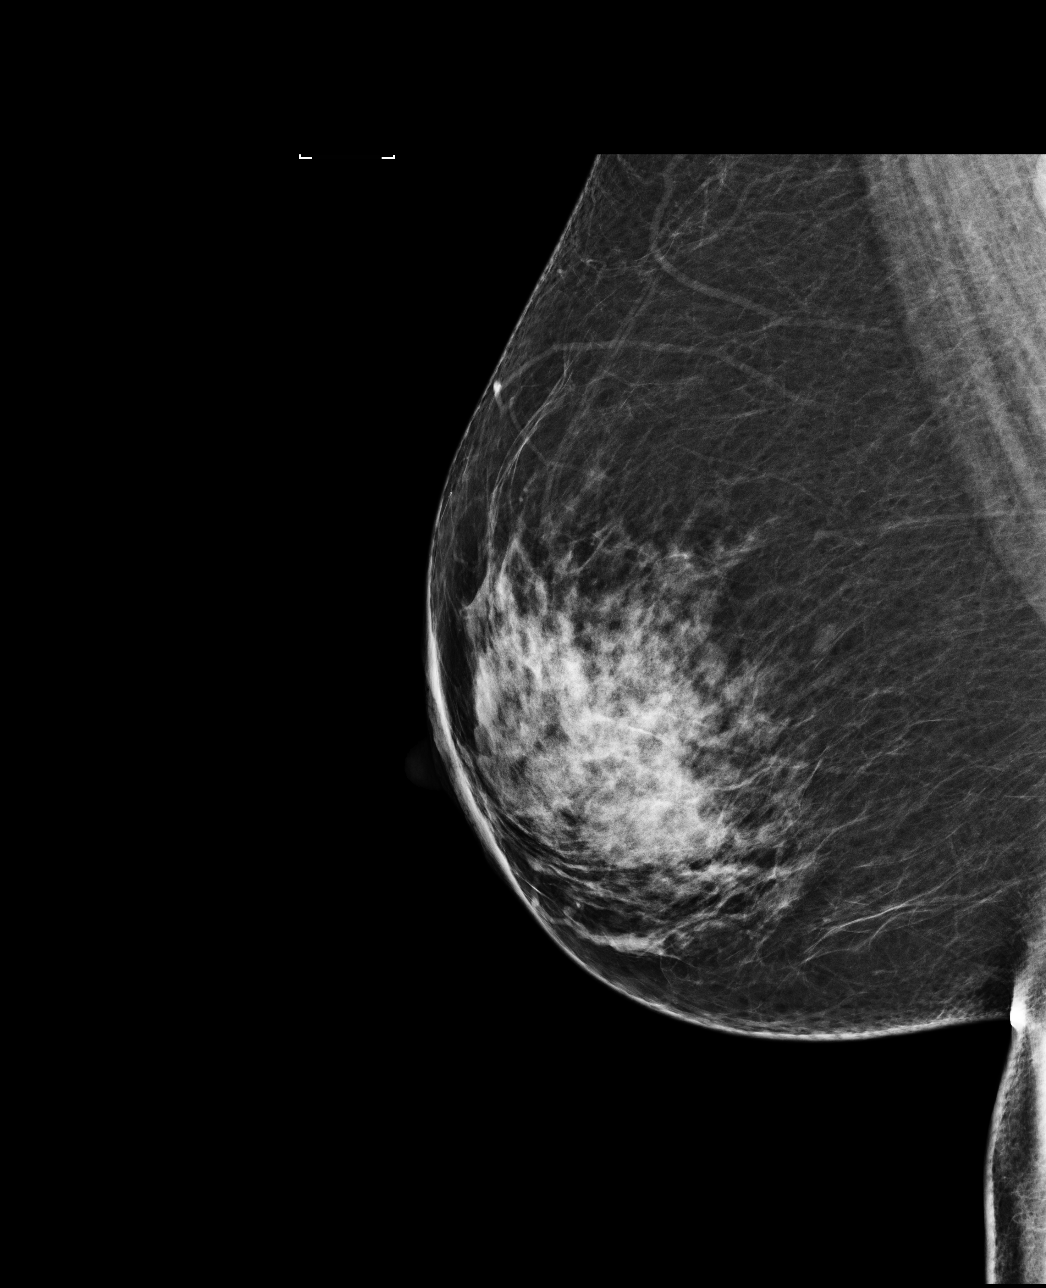

[4 of 4 positions shown; findings below may reference images not displayed]

ACR Breast Density Category c: The breast tissue is heterogeneously
dense, which may obscure small masses.
FINDINGS: There are no findings suspicious for malignancy. Images were
processed with CAD.
IMPRESSION: No mammographic evidence of malignancy. A result letter of this
screening mammogram will be mailed directly to the patient.

RECOMMENDATION:
Screening mammogram in one year. (Code:[id number])

BI-RADS CATEGORY  1: Negative.

## 2017-03-19 ENCOUNTER — Other Ambulatory Visit: Payer: Self-pay | Admitting: Internal Medicine

## 2017-03-19 DIAGNOSIS — Z1231 Encounter for screening mammogram for malignant neoplasm of breast: Secondary | ICD-10-CM

## 2017-04-07 ENCOUNTER — Ambulatory Visit
Admission: RE | Admit: 2017-04-07 | Discharge: 2017-04-07 | Disposition: A | Discharge status: Home / Self Care | Payer: Medicare Other | Source: Ambulatory Visit | Attending: Internal Medicine | Admitting: Internal Medicine

## 2017-04-07 DIAGNOSIS — Z1231 Encounter for screening mammogram for malignant neoplasm of breast: Secondary | ICD-10-CM | POA: Diagnosis present

## 2017-09-20 ENCOUNTER — Encounter: Payer: Self-pay | Admitting: Student

## 2017-09-21 ENCOUNTER — Encounter
Admission: RE | Disposition: A | Discharge status: Home / Self Care | Payer: Self-pay | Source: Ambulatory Visit | Attending: Gastroenterology

## 2017-09-21 ENCOUNTER — Ambulatory Visit
Admission: RE | Admit: 2017-09-21 | Discharge: 2017-09-21 | Disposition: A | Discharge status: Home / Self Care | Payer: Medicare Other | Source: Ambulatory Visit | Attending: Gastroenterology | Admitting: Gastroenterology

## 2017-09-21 ENCOUNTER — Ambulatory Visit: Payer: Medicare Other | Admitting: Anesthesiology

## 2017-09-21 DIAGNOSIS — F988 Other specified behavioral and emotional disorders with onset usually occurring in childhood and adolescence: Secondary | ICD-10-CM | POA: Insufficient documentation

## 2017-09-21 DIAGNOSIS — G4733 Obstructive sleep apnea (adult) (pediatric): Secondary | ICD-10-CM | POA: Insufficient documentation

## 2017-09-21 DIAGNOSIS — Z885 Allergy status to narcotic agent status: Secondary | ICD-10-CM | POA: Diagnosis not present

## 2017-09-21 DIAGNOSIS — Z888 Allergy status to other drugs, medicaments and biological substances status: Secondary | ICD-10-CM | POA: Diagnosis not present

## 2017-09-21 DIAGNOSIS — Z79899 Other long term (current) drug therapy: Secondary | ICD-10-CM | POA: Insufficient documentation

## 2017-09-21 DIAGNOSIS — E785 Hyperlipidemia, unspecified: Secondary | ICD-10-CM | POA: Diagnosis not present

## 2017-09-21 DIAGNOSIS — F419 Anxiety disorder, unspecified: Secondary | ICD-10-CM | POA: Insufficient documentation

## 2017-09-21 DIAGNOSIS — Z1211 Encounter for screening for malignant neoplasm of colon: Secondary | ICD-10-CM | POA: Insufficient documentation

## 2017-09-21 DIAGNOSIS — M797 Fibromyalgia: Secondary | ICD-10-CM | POA: Insufficient documentation

## 2017-09-21 DIAGNOSIS — I10 Essential (primary) hypertension: Secondary | ICD-10-CM | POA: Insufficient documentation

## 2017-09-21 DIAGNOSIS — M19072 Primary osteoarthritis, left ankle and foot: Secondary | ICD-10-CM | POA: Insufficient documentation

## 2017-09-21 DIAGNOSIS — K219 Gastro-esophageal reflux disease without esophagitis: Secondary | ICD-10-CM | POA: Diagnosis not present

## 2017-09-21 DIAGNOSIS — K5730 Diverticulosis of large intestine without perforation or abscess without bleeding: Secondary | ICD-10-CM | POA: Insufficient documentation

## 2017-09-21 DIAGNOSIS — F319 Bipolar disorder, unspecified: Secondary | ICD-10-CM | POA: Insufficient documentation

## 2017-09-21 DIAGNOSIS — M170 Bilateral primary osteoarthritis of knee: Secondary | ICD-10-CM | POA: Insufficient documentation

## 2017-09-21 DIAGNOSIS — M19071 Primary osteoarthritis, right ankle and foot: Secondary | ICD-10-CM | POA: Insufficient documentation

## 2017-09-21 DIAGNOSIS — Z8371 Family history of colonic polyps: Secondary | ICD-10-CM | POA: Diagnosis not present

## 2017-09-21 HISTORY — DX: Other specified behavioral and emotional disorders with onset usually occurring in childhood and adolescence: F98.8

## 2017-09-21 HISTORY — DX: Unspecified convulsions: R56.9

## 2017-09-21 HISTORY — PX: COLONOSCOPY WITH PROPOFOL: SHX5780

## 2017-09-21 SURGERY — COLONOSCOPY WITH PROPOFOL
Anesthesia: General

## 2017-09-21 MED ORDER — LIDOCAINE HCL (CARDIAC) PF 100 MG/5ML IV SOSY
PREFILLED_SYRINGE | INTRAVENOUS | Status: DC | PRN
  Administered 2017-09-21: 50 mg via INTRAVENOUS

## 2017-09-21 MED ORDER — PROPOFOL 10 MG/ML IV BOLUS
INTRAVENOUS | Status: DC | PRN
  Administered 2017-09-21: 20 mg via INTRAVENOUS
  Administered 2017-09-21: 60 mg via INTRAVENOUS
  Administered 2017-09-21: 30 mg via INTRAVENOUS
  Administered 2017-09-21: 20 mg via INTRAVENOUS

## 2017-09-21 MED ORDER — SODIUM CHLORIDE 0.9 % IV SOLN
INTRAVENOUS | Status: DC
  Administered 2017-09-21: 1000 mL via INTRAVENOUS

## 2017-09-21 MED ORDER — PROPOFOL 500 MG/50ML IV EMUL
INTRAVENOUS | Status: DC | PRN
  Administered 2017-09-21: 130 ug/kg/min via INTRAVENOUS

## 2017-09-21 MED ORDER — SODIUM CHLORIDE 0.9 % IV SOLN: INTRAVENOUS | Status: DC

## 2017-09-21 MED ORDER — LIDOCAINE HCL (PF) 2 % IJ SOLN
INTRAMUSCULAR | Status: AC
  Filled 2017-09-21: qty 10

## 2017-09-21 MED ORDER — PROPOFOL 500 MG/50ML IV EMUL
INTRAVENOUS | Status: AC
  Filled 2017-09-21: qty 50

## 2017-09-21 NOTE — Anesthesia Preprocedure Evaluation (Signed)
Anesthesia Evaluation  Patient identified by MRN, date of birth, ID band Patient awake    Reviewed: Allergy & Precautions, H&P , NPO status , Patient's Chart, lab work & pertinent test results, reviewed documented beta blocker date and time   History of Anesthesia Complications (+) PROLONGED EMERGENCE, Family history of anesthesia reaction and history of anesthetic complications  Airway Mallampati: II  TM Distance: >3 FB Neck ROM: full    Dental  (+) Caps, Dental Advidsory Given   Pulmonary neg pulmonary ROS, neg shortness of breath, sleep apnea , neg COPD, neg recent URI,           Cardiovascular Exercise Tolerance: Good hypertension, (-) angina(-) CAD, (-) Past MI, (-) Cardiac Stents and (-) CABG (-) dysrhythmias (-) Valvular Problems/Murmurs     Neuro/Psych  Headaches, Seizures -,  PSYCHIATRIC DISORDERS Anxiety Depression Bipolar Disorder  Neuromuscular disease (fibromyalgia)    GI/Hepatic Neg liver ROS, GERD  ,  Endo/Other  negative endocrine ROS  Renal/GU negative Renal ROS  negative genitourinary   Musculoskeletal   Abdominal   Peds  Hematology negative hematology ROS (+)   Anesthesia Other Findings Past Medical History: No date: ADD (attention deficit disorder) No date: Anxiety No date: Arthritis     Comment:  feet, knees (07/04/2015) No date: Bipolar disorder (HCC) No date: Complication of anesthesia     Comment:  hard to wake up, blood pressure drops  No date: Depression No date: Family history of adverse reaction to anesthesia     Comment:  "sister hard to wake up also" dx'd ~ 2000: Fibromyalgia No date: GERD (gastroesophageal reflux disease) No date: Hyperlipidemia No date: Hypertension No date: Migraine     Comment:  "hormonal; stopped when I was 16" (07/04/2015) No date: OSA (obstructive sleep apnea)     Comment:  wears mouth piece, no CPAP No date: Seizures ([place])    Reproductive/Obstetrics negative OB ROS                             Anesthesia Physical Anesthesia Plan  ASA: III  Anesthesia Plan: General   Post-op Pain Management:    Induction: Intravenous  PONV Risk Score and Plan: 3 and Propofol infusion  Airway Management Planned: Nasal Cannula  Additional Equipment:   Intra-op Plan:   Post-operative Plan:   Informed Consent: I have reviewed the patients History and Physical, chart, labs and discussed the procedure including the risks, benefits and alternatives for the proposed anesthesia with the patient or authorized representative who has indicated his/her understanding and acceptance.   Dental Advisory Given  Plan Discussed with: Anesthesiologist, CRNA and Surgeon  Anesthesia Plan Comments:         Anesthesia Quick Evaluation

## 2017-09-21 NOTE — Anesthesia Postprocedure Evaluation (Signed)
Anesthesia Post Note  Patient: Whitney Lewis  Procedure(s) Performed: COLONOSCOPY WITH PROPOFOL (N/A )  Patient location during evaluation: Endoscopy Anesthesia Type: General Level of consciousness: awake and alert Pain management: pain level controlled Vital Signs Assessment: post-procedure vital signs reviewed and stable Respiratory status: spontaneous breathing, nonlabored ventilation, respiratory function stable and patient connected to nasal cannula oxygen Cardiovascular status: blood pressure returned to baseline and stable Postop Assessment: no apparent nausea or vomiting Anesthetic complications: no     Last Vitals:  Vitals:   09/21/17 1150 09/21/17 1343  BP: (!) 130/93   Pulse: 83   Resp: 20   Temp: 36.6 C (!) 36.1 C  SpO2: 99%     Last Pain:  Vitals:   09/21/17 1150  PainSc: 0-No pain                 Precious Haws Yarel Rushlow

## 2017-09-21 NOTE — Transfer of Care (Signed)
Immediate Anesthesia Transfer of Care Note  Patient: Whitney Lewis  Procedure(s) Performed: COLONOSCOPY WITH PROPOFOL (N/A )  Patient Location: PACU and Endoscopy Unit  Anesthesia Type:General  Level of Consciousness: drowsy  Airway & Oxygen Therapy: Patient Spontanous Breathing and Patient connected to nasal cannula oxygen  Post-op Assessment: Report given to RN and Post -op Vital signs reviewed and stable  Post vital signs: Reviewed and stable  Last Vitals:  Vitals Value Taken Time  BP 141/95 09/21/2017  1:42 PM  Temp 36.1 C 09/21/2017  1:43 PM  Pulse 66 09/21/2017  1:42 PM  Resp 18 09/21/2017  1:42 PM  SpO2 99 % 09/21/2017  1:42 PM  Vitals shown include unvalidated device data.  Last Pain:  Vitals:   09/21/17 1150  PainSc: 0-No pain         Complications: No apparent anesthesia complications

## 2017-09-21 NOTE — Anesthesia Post-op Follow-up Note (Signed)
Anesthesia QCDR form completed.        

## 2017-09-21 NOTE — H&P (Signed)
Outpatient short stay form Pre-procedure 09/21/2017 1:03 PM Lollie Sails MD  Primary Physician: Dr Tracie Harrier  Reason for visit: Screening colonoscopy  History of present illness: Patient is a 57 year old female presenting today as above.  She has family history of colon polyps in primary relative, father.  Patient's last colonoscopy was about 5 years ago.  Is not had polyps herself.  Tolerated prep well.  She takes no aspirin or blood thinning agent.    Current Facility-Administered Medications:  .  0.9 %  sodium chloride infusion, , Intravenous, Continuous, Lollie Sails, MD .  0.9 %  sodium chloride infusion, , Intravenous, Continuous, Lollie Sails, MD, Last Rate: 20 mL/hr at 09/21/17 1155, 1,000 mL at 09/21/17 1155 .  0.9 %  sodium chloride infusion, , Intravenous, Continuous, Lollie Sails, MD  Medications Prior to Admission  Medication Sig Dispense Refill Last Dose  . amphetamine-dextroamphetamine (ADDERALL) 30 MG tablet Take 30 mg by mouth 3 (three) times daily as needed.    09/20/2017 at Unknown time  . ARIPiprazole (ABILIFY) 15 MG tablet Take 15 mg by mouth daily.    09/20/2017 at Unknown time  . DULoxetine (CYMBALTA) 60 MG capsule Take 120 mg by mouth every evening.    09/20/2017 at Unknown time  . lisinopril (PRINIVIL,ZESTRIL) 20 MG tablet Take 20 mg by mouth daily. Reported on 08/13/2015   09/20/2017 at Unknown time  . simvastatin (ZOCOR) 20 MG tablet Take 20 mg by mouth at bedtime.    09/20/2017 at Unknown time  . valACYclovir (VALTREX) 1000 MG tablet Take 1,000 mg by mouth 2 (two) times daily.   Past Week at Unknown time  . amoxicillin (AMOXIL) 500 MG capsule Take 2,000 mg by mouth See admin instructions. 1 hour prior to dental appointment   Completed Course at Unknown time  . HYDROmorphone (DILAUDID) 2 MG tablet Take 1 tablet (2 mg total) by mouth every 4 (four) hours as needed for severe pain. (Patient not taking: Reported on 09/21/2017) 60 tablet 0  Completed Course at Unknown time  . methocarbamol (ROBAXIN) 500 MG tablet Take 500 mg by mouth every 6 (six) hours as needed for muscle spasms.   Completed Course at Unknown time  . methocarbamol (ROBAXIN) 500 MG tablet Take 1 tablet (500 mg total) by mouth every 6 (six) hours as needed for muscle spasms. (Patient not taking: Reported on 09/21/2017) 40 tablet 3 Completed Course at Unknown time  . Multiple Vitamin (MULTIVITAMIN WITH MINERALS) TABS tablet Take 1 tablet by mouth every other day.    09/17/2017  . traMADol (ULTRAM) 50 MG tablet Take 50 mg by mouth every 6 (six) hours as needed for moderate pain.   01/01/2017 at Unknown time     Allergies  Allergen Reactions  . Cefazolin Rash    Quick onset after 2nd dose of cefazolin, full body rash, no mucosal involvement  . Voltaren [Diclofenac Sodium] Other (See Comments)  . Codeine Nausea Only  . Oxycodone-Acetaminophen Nausea Only     Past Medical History:  Diagnosis Date  . ADD (attention deficit disorder)   . Anxiety   . Arthritis    feet, knees (07/04/2015)  . Bipolar disorder ([place])   . Complication of anesthesia    hard to wake up, blood pressure drops   . Depression   . Family history of adverse reaction to anesthesia    "sister hard to wake up also"  . Fibromyalgia dx'd ~ 2000  . GERD (gastroesophageal reflux disease)   .  Hyperlipidemia   . Hypertension   . Migraine    "hormonal; stopped when I was 16" (07/04/2015)  . OSA (obstructive sleep apnea)    wears mouth piece, no CPAP  . Seizures ([place])     Review of systems:      Physical Exam    Heart and lungs: Regular rate and rhythm without rub or gallop, lungs are bilaterally clear.    HEENT: Normocephalic atraumatic eyes are anicteric    Other:    Pertinant exam for procedure: Soft nontender nondistended bowel sounds positive normoactive    Planned proceedures: Colonoscopy and indicated procedures. I have discussed the risks benefits and complications of  procedures to include not limited to bleeding, infection, perforation and the risk of sedation and the patient wishes to proceed.    Lollie Sails, MD Gastroenterology 09/21/2017  1:03 PM

## 2017-09-21 NOTE — Op Note (Signed)
[facility] Gastroenterology Patient Name: Whitney Lewis Procedure Date: 09/21/2017 12:56 PM MRN: [id number] Account #: [id number] Date of Birth: 05/25/1961 Admit Type: Outpatient Age: 57 Room: [facility] ENDO ROOM 3 Gender: Female Note Status: Finalized Procedure:            Colonoscopy Indications:          Family history of colonic polyps in a first-degree                        relative Providers:            Lollie Sails, MD Referring MD:         Tracie Harrier, MD (Referring MD) Medicines:            Monitored Anesthesia Care Complications:        No immediate complications. Procedure:            Pre-Anesthesia Assessment:                       - ASA Grade Assessment: III - A patient with severe                        systemic disease.                       After obtaining informed consent, the colonoscope was                        passed under direct vision. Throughout the procedure,                        the patient's blood pressure, pulse, and oxygen                        saturations were monitored continuously. The                        Colonoscope was introduced through the anus and                        advanced to the the cecum, identified by appendiceal                        orifice and ileocecal valve. The colonoscopy was                        unusually difficult due to restricted mobility of the                        colon. Successful completion of the procedure was aided                        by changing the patient to a supine position, changing                        the patient to a prone position and using manual                        pressure. The quality of the bowel preparation was good. Findings:      Multiple small and large-mouthed diverticula were found  in the sigmoid       colon, descending colon and transverse colon.      The exam was otherwise normal throughout the examined colon.      The retroflexed view of the  distal rectum and anal verge was normal and       showed no anal or rectal abnormalities. Impression:           - Diverticulosis in the sigmoid colon, in the                        descending colon and in the transverse colon.                       - The distal rectum and anal verge are normal on                        retroflexion view.                       - No specimens collected. Recommendation:       - Discharge patient to home.                       - Repeat colonoscopy in 5 years for screening purposes. Procedure Code(s):    --- Professional ---                       [phone], Colonoscopy, flexible; diagnostic, including                        collection of specimen(s) by brushing or washing, when                        performed (separate procedure) Diagnosis Code(s):    --- Professional ---                       Z83.71, Family history of colonic polyps                       K57.30, Diverticulosis of large intestine without                        perforation or abscess without bleeding CPT copyright 2017 [employer]. All rights reserved. The codes documented in this report are preliminary and upon coder review may  be revised to meet current compliance requirements. Lollie Sails, MD 09/21/2017 1:40:38 PM This report has been signed electronically. Number of Addenda: 0 Note Initiated On: 09/21/2017 12:56 PM Scope Withdrawal Time: 0 hours 3 minutes 49 seconds  Total Procedure Duration: 0 hours 24 minutes 56 seconds       [facility]

## 2017-09-23 ENCOUNTER — Encounter: Payer: Self-pay | Admitting: Gastroenterology

## 2018-04-05 ENCOUNTER — Other Ambulatory Visit: Payer: Self-pay | Admitting: Internal Medicine

## 2018-04-05 DIAGNOSIS — Z1231 Encounter for screening mammogram for malignant neoplasm of breast: Secondary | ICD-10-CM

## 2018-07-19 ENCOUNTER — Ambulatory Visit
Admission: RE | Admit: 2018-07-19 | Discharge: 2018-07-19 | Disposition: A | Discharge status: Home / Self Care | Payer: Medicare HMO | Source: Ambulatory Visit | Attending: Internal Medicine | Admitting: Internal Medicine

## 2018-07-19 DIAGNOSIS — Z1231 Encounter for screening mammogram for malignant neoplasm of breast: Secondary | ICD-10-CM | POA: Insufficient documentation

## 2019-02-14 ENCOUNTER — Other Ambulatory Visit: Payer: Self-pay | Admitting: Internal Medicine

## 2019-02-14 DIAGNOSIS — M25512 Pain in left shoulder: Secondary | ICD-10-CM

## 2019-02-14 DIAGNOSIS — M79622 Pain in left upper arm: Secondary | ICD-10-CM

## 2019-02-14 DIAGNOSIS — M542 Cervicalgia: Secondary | ICD-10-CM

## 2019-03-06 ENCOUNTER — Other Ambulatory Visit: Payer: Self-pay

## 2019-03-06 ENCOUNTER — Ambulatory Visit
Admission: RE | Admit: 2019-03-06 | Discharge: 2019-03-06 | Disposition: A | Discharge status: Home / Self Care | Payer: Medicare HMO | Source: Ambulatory Visit | Attending: Internal Medicine | Admitting: Internal Medicine

## 2019-03-06 DIAGNOSIS — M25512 Pain in left shoulder: Secondary | ICD-10-CM

## 2019-03-06 DIAGNOSIS — M542 Cervicalgia: Secondary | ICD-10-CM

## 2019-03-06 DIAGNOSIS — M79622 Pain in left upper arm: Secondary | ICD-10-CM

## 2019-03-06 MED ORDER — GADOBENATE DIMEGLUMINE 529 MG/ML IV SOLN
15.0000 mL | Freq: Once | INTRAVENOUS | Status: AC | PRN
  Administered 2019-03-06: 15 mL via INTRAVENOUS

## 2019-06-09 ENCOUNTER — Other Ambulatory Visit: Payer: Self-pay | Admitting: Internal Medicine

## 2019-06-09 DIAGNOSIS — Z1231 Encounter for screening mammogram for malignant neoplasm of breast: Secondary | ICD-10-CM

## 2019-07-24 ENCOUNTER — Ambulatory Visit
Admission: RE | Admit: 2019-07-24 | Discharge: 2019-07-24 | Disposition: A | Discharge status: Home / Self Care | Payer: Medicare HMO | Source: Ambulatory Visit | Attending: Internal Medicine | Admitting: Internal Medicine

## 2019-07-24 DIAGNOSIS — Z1231 Encounter for screening mammogram for malignant neoplasm of breast: Secondary | ICD-10-CM | POA: Insufficient documentation

## 2020-05-30 DIAGNOSIS — M21961 Unspecified acquired deformity of right lower leg: Secondary | ICD-10-CM | POA: Diagnosis not present

## 2020-05-30 DIAGNOSIS — M24572 Contracture, left ankle: Secondary | ICD-10-CM | POA: Diagnosis not present

## 2020-05-30 DIAGNOSIS — M2042 Other hammer toe(s) (acquired), left foot: Secondary | ICD-10-CM | POA: Diagnosis not present

## 2020-05-30 DIAGNOSIS — M21962 Unspecified acquired deformity of left lower leg: Secondary | ICD-10-CM | POA: Diagnosis not present

## 2020-06-06 DIAGNOSIS — M24572 Contracture, left ankle: Secondary | ICD-10-CM | POA: Diagnosis not present

## 2020-06-06 DIAGNOSIS — M205X2 Other deformities of toe(s) (acquired), left foot: Secondary | ICD-10-CM | POA: Diagnosis not present

## 2020-06-06 DIAGNOSIS — M21962 Unspecified acquired deformity of left lower leg: Secondary | ICD-10-CM | POA: Diagnosis not present

## 2020-06-06 DIAGNOSIS — M21961 Unspecified acquired deformity of right lower leg: Secondary | ICD-10-CM | POA: Diagnosis not present

## 2020-06-07 DIAGNOSIS — Z0000 Encounter for general adult medical examination without abnormal findings: Secondary | ICD-10-CM | POA: Diagnosis not present

## 2020-06-07 DIAGNOSIS — Z01818 Encounter for other preprocedural examination: Secondary | ICD-10-CM | POA: Diagnosis not present

## 2020-06-07 DIAGNOSIS — Z01812 Encounter for preprocedural laboratory examination: Secondary | ICD-10-CM | POA: Diagnosis not present

## 2020-06-14 DIAGNOSIS — M205X2 Other deformities of toe(s) (acquired), left foot: Secondary | ICD-10-CM | POA: Diagnosis not present

## 2020-06-14 DIAGNOSIS — M216X2 Other acquired deformities of left foot: Secondary | ICD-10-CM | POA: Diagnosis not present

## 2020-06-14 DIAGNOSIS — M2042 Other hammer toe(s) (acquired), left foot: Secondary | ICD-10-CM | POA: Diagnosis not present

## 2020-06-14 DIAGNOSIS — D2372 Other benign neoplasm of skin of left lower limb, including hip: Secondary | ICD-10-CM | POA: Diagnosis not present

## 2020-06-14 DIAGNOSIS — L989 Disorder of the skin and subcutaneous tissue, unspecified: Secondary | ICD-10-CM | POA: Diagnosis not present

## 2020-06-17 DIAGNOSIS — R69 Illness, unspecified: Secondary | ICD-10-CM | POA: Diagnosis not present

## 2020-06-19 DIAGNOSIS — M205X2 Other deformities of toe(s) (acquired), left foot: Secondary | ICD-10-CM | POA: Diagnosis not present

## 2020-07-01 DIAGNOSIS — R69 Illness, unspecified: Secondary | ICD-10-CM | POA: Diagnosis not present

## 2020-07-12 DIAGNOSIS — F902 Attention-deficit hyperactivity disorder, combined type: Secondary | ICD-10-CM | POA: Diagnosis not present

## 2020-07-12 DIAGNOSIS — R69 Illness, unspecified: Secondary | ICD-10-CM | POA: Diagnosis not present

## 2020-07-12 DIAGNOSIS — F411 Generalized anxiety disorder: Secondary | ICD-10-CM | POA: Diagnosis not present

## 2020-07-15 DIAGNOSIS — M205X2 Other deformities of toe(s) (acquired), left foot: Secondary | ICD-10-CM | POA: Diagnosis not present

## 2020-07-24 DIAGNOSIS — N941 Unspecified dyspareunia: Secondary | ICD-10-CM | POA: Diagnosis not present

## 2020-07-24 DIAGNOSIS — Z124 Encounter for screening for malignant neoplasm of cervix: Secondary | ICD-10-CM | POA: Diagnosis not present

## 2020-07-24 DIAGNOSIS — Z6825 Body mass index (BMI) 25.0-25.9, adult: Secondary | ICD-10-CM | POA: Diagnosis not present

## 2020-07-24 DIAGNOSIS — Z1279 Encounter for screening for malignant neoplasm of other genitourinary organs: Secondary | ICD-10-CM | POA: Diagnosis not present

## 2020-08-06 DIAGNOSIS — R69 Illness, unspecified: Secondary | ICD-10-CM | POA: Diagnosis not present

## 2020-08-16 DIAGNOSIS — R197 Diarrhea, unspecified: Secondary | ICD-10-CM | POA: Diagnosis not present

## 2020-08-16 DIAGNOSIS — K30 Functional dyspepsia: Secondary | ICD-10-CM | POA: Diagnosis not present

## 2020-08-20 DIAGNOSIS — R69 Illness, unspecified: Secondary | ICD-10-CM | POA: Diagnosis not present

## 2020-08-20 DIAGNOSIS — Z0000 Encounter for general adult medical examination without abnormal findings: Secondary | ICD-10-CM | POA: Diagnosis not present

## 2020-08-20 DIAGNOSIS — Z01818 Encounter for other preprocedural examination: Secondary | ICD-10-CM | POA: Diagnosis not present

## 2020-08-20 DIAGNOSIS — Z01812 Encounter for preprocedural laboratory examination: Secondary | ICD-10-CM | POA: Diagnosis not present

## 2020-08-20 DIAGNOSIS — E7800 Pure hypercholesterolemia, unspecified: Secondary | ICD-10-CM | POA: Diagnosis not present

## 2020-08-20 DIAGNOSIS — R7989 Other specified abnormal findings of blood chemistry: Secondary | ICD-10-CM | POA: Diagnosis not present

## 2020-08-20 DIAGNOSIS — Z1159 Encounter for screening for other viral diseases: Secondary | ICD-10-CM | POA: Diagnosis not present

## 2020-08-20 DIAGNOSIS — I10 Essential (primary) hypertension: Secondary | ICD-10-CM | POA: Diagnosis not present

## 2020-08-20 DIAGNOSIS — E559 Vitamin D deficiency, unspecified: Secondary | ICD-10-CM | POA: Diagnosis not present

## 2020-08-20 DIAGNOSIS — G4733 Obstructive sleep apnea (adult) (pediatric): Secondary | ICD-10-CM | POA: Diagnosis not present

## 2020-09-03 DIAGNOSIS — R69 Illness, unspecified: Secondary | ICD-10-CM | POA: Diagnosis not present

## 2020-09-13 DIAGNOSIS — R69 Illness, unspecified: Secondary | ICD-10-CM | POA: Diagnosis not present

## 2020-09-13 DIAGNOSIS — F902 Attention-deficit hyperactivity disorder, combined type: Secondary | ICD-10-CM | POA: Diagnosis not present

## 2020-09-13 DIAGNOSIS — F411 Generalized anxiety disorder: Secondary | ICD-10-CM | POA: Diagnosis not present

## 2020-10-01 DIAGNOSIS — R69 Illness, unspecified: Secondary | ICD-10-CM | POA: Diagnosis not present

## 2020-11-07 DIAGNOSIS — R69 Illness, unspecified: Secondary | ICD-10-CM | POA: Diagnosis not present

## 2020-11-18 DIAGNOSIS — Z8619 Personal history of other infectious and parasitic diseases: Secondary | ICD-10-CM | POA: Diagnosis not present

## 2020-11-18 DIAGNOSIS — R197 Diarrhea, unspecified: Secondary | ICD-10-CM | POA: Diagnosis not present

## 2020-11-19 DIAGNOSIS — R197 Diarrhea, unspecified: Secondary | ICD-10-CM | POA: Diagnosis not present

## 2020-12-02 DIAGNOSIS — M21961 Unspecified acquired deformity of right lower leg: Secondary | ICD-10-CM | POA: Diagnosis not present

## 2020-12-02 DIAGNOSIS — M21962 Unspecified acquired deformity of left lower leg: Secondary | ICD-10-CM | POA: Diagnosis not present

## 2020-12-02 DIAGNOSIS — M2042 Other hammer toe(s) (acquired), left foot: Secondary | ICD-10-CM | POA: Diagnosis not present

## 2020-12-02 DIAGNOSIS — M205X2 Other deformities of toe(s) (acquired), left foot: Secondary | ICD-10-CM | POA: Diagnosis not present

## 2020-12-03 DIAGNOSIS — R69 Illness, unspecified: Secondary | ICD-10-CM | POA: Diagnosis not present

## 2020-12-05 DIAGNOSIS — K6389 Other specified diseases of intestine: Secondary | ICD-10-CM | POA: Diagnosis not present

## 2020-12-05 DIAGNOSIS — D123 Benign neoplasm of transverse colon: Secondary | ICD-10-CM | POA: Diagnosis not present

## 2020-12-05 DIAGNOSIS — K635 Polyp of colon: Secondary | ICD-10-CM | POA: Diagnosis not present

## 2020-12-05 DIAGNOSIS — K5730 Diverticulosis of large intestine without perforation or abscess without bleeding: Secondary | ICD-10-CM | POA: Diagnosis not present

## 2020-12-05 DIAGNOSIS — R197 Diarrhea, unspecified: Secondary | ICD-10-CM | POA: Diagnosis not present

## 2020-12-23 ENCOUNTER — Other Ambulatory Visit: Payer: Self-pay | Admitting: Internal Medicine

## 2020-12-23 DIAGNOSIS — Z1231 Encounter for screening mammogram for malignant neoplasm of breast: Secondary | ICD-10-CM

## 2020-12-30 DIAGNOSIS — R69 Illness, unspecified: Secondary | ICD-10-CM | POA: Diagnosis not present

## 2021-01-02 ENCOUNTER — Ambulatory Visit
Admission: RE | Admit: 2021-01-02 | Discharge: 2021-01-02 | Disposition: A | Discharge status: Home / Self Care | Payer: Medicare HMO | Source: Ambulatory Visit | Attending: Internal Medicine | Admitting: Internal Medicine

## 2021-01-02 ENCOUNTER — Other Ambulatory Visit: Payer: Self-pay

## 2021-01-02 DIAGNOSIS — Z1231 Encounter for screening mammogram for malignant neoplasm of breast: Secondary | ICD-10-CM | POA: Insufficient documentation

## 2021-01-06 DIAGNOSIS — M21961 Unspecified acquired deformity of right lower leg: Secondary | ICD-10-CM | POA: Diagnosis not present

## 2021-01-06 DIAGNOSIS — M205X2 Other deformities of toe(s) (acquired), left foot: Secondary | ICD-10-CM | POA: Diagnosis not present

## 2021-01-20 DIAGNOSIS — R69 Illness, unspecified: Secondary | ICD-10-CM | POA: Diagnosis not present

## 2021-02-24 DIAGNOSIS — F902 Attention-deficit hyperactivity disorder, combined type: Secondary | ICD-10-CM | POA: Diagnosis not present

## 2021-02-24 DIAGNOSIS — F3181 Bipolar II disorder: Secondary | ICD-10-CM | POA: Diagnosis not present

## 2021-02-24 DIAGNOSIS — R69 Illness, unspecified: Secondary | ICD-10-CM | POA: Diagnosis not present

## 2021-02-24 DIAGNOSIS — F411 Generalized anxiety disorder: Secondary | ICD-10-CM | POA: Diagnosis not present

## 2021-02-26 DIAGNOSIS — R69 Illness, unspecified: Secondary | ICD-10-CM | POA: Diagnosis not present

## 2021-02-26 DIAGNOSIS — F3171 Bipolar disorder, in partial remission, most recent episode hypomanic: Secondary | ICD-10-CM | POA: Diagnosis not present

## 2021-03-03 DIAGNOSIS — R69 Illness, unspecified: Secondary | ICD-10-CM | POA: Diagnosis not present

## 2021-03-03 DIAGNOSIS — R197 Diarrhea, unspecified: Secondary | ICD-10-CM | POA: Diagnosis not present

## 2021-03-03 DIAGNOSIS — G4733 Obstructive sleep apnea (adult) (pediatric): Secondary | ICD-10-CM | POA: Diagnosis not present

## 2021-03-03 DIAGNOSIS — G40909 Epilepsy, unspecified, not intractable, without status epilepticus: Secondary | ICD-10-CM | POA: Diagnosis not present

## 2021-03-03 DIAGNOSIS — I10 Essential (primary) hypertension: Secondary | ICD-10-CM | POA: Diagnosis not present

## 2021-03-03 DIAGNOSIS — M797 Fibromyalgia: Secondary | ICD-10-CM | POA: Diagnosis not present

## 2021-03-03 DIAGNOSIS — E785 Hyperlipidemia, unspecified: Secondary | ICD-10-CM | POA: Diagnosis not present

## 2021-03-03 DIAGNOSIS — Z800 Family history of malignant neoplasm of digestive organs: Secondary | ICD-10-CM | POA: Diagnosis not present

## 2021-04-10 ENCOUNTER — Ambulatory Visit: Payer: Medicare HMO | Admitting: Family Medicine

## 2021-04-11 ENCOUNTER — Ambulatory Visit: Payer: Medicare HMO | Admitting: Family Medicine

## 2021-04-12 DIAGNOSIS — Z20828 Contact with and (suspected) exposure to other viral communicable diseases: Secondary | ICD-10-CM | POA: Diagnosis not present

## 2021-04-12 DIAGNOSIS — R110 Nausea: Secondary | ICD-10-CM | POA: Diagnosis not present

## 2021-04-12 DIAGNOSIS — J069 Acute upper respiratory infection, unspecified: Secondary | ICD-10-CM | POA: Diagnosis not present

## 2021-04-12 DIAGNOSIS — R197 Diarrhea, unspecified: Secondary | ICD-10-CM | POA: Diagnosis not present

## 2021-04-14 DIAGNOSIS — R69 Illness, unspecified: Secondary | ICD-10-CM | POA: Diagnosis not present

## 2021-04-14 DIAGNOSIS — F3171 Bipolar disorder, in partial remission, most recent episode hypomanic: Secondary | ICD-10-CM | POA: Diagnosis not present

## 2021-04-21 ENCOUNTER — Encounter: Payer: Self-pay | Admitting: Family Medicine

## 2021-04-21 ENCOUNTER — Ambulatory Visit: Payer: Medicare HMO | Admitting: Family Medicine

## 2021-04-21 ENCOUNTER — Other Ambulatory Visit: Payer: Self-pay

## 2021-04-21 ENCOUNTER — Ambulatory Visit: Payer: Medicare HMO

## 2021-04-21 VITALS — BP 120/76 | HR 80 | Ht 66.0 in | Wt 153.6 lb

## 2021-04-21 DIAGNOSIS — M7741 Metatarsalgia, right foot: Secondary | ICD-10-CM | POA: Diagnosis not present

## 2021-04-21 DIAGNOSIS — M25551 Pain in right hip: Secondary | ICD-10-CM

## 2021-04-21 DIAGNOSIS — M7061 Trochanteric bursitis, right hip: Secondary | ICD-10-CM | POA: Diagnosis not present

## 2021-04-21 DIAGNOSIS — M79671 Pain in right foot: Secondary | ICD-10-CM

## 2021-04-21 NOTE — Progress Notes (Signed)
    Subjective:    CC: R hip and R foot pain  I, Molly Weber, LAT, ATC, am serving as scribe for Dr. Lynne Leader.  HPI: Pt is a 60 y/o female presenting w/ c/o B hip and R foot pain.  R hip pain:  Chronic since May 2022.  Locates pain to R lateral hip and into R buttock/SIJ area.  She has a hx of a L4-5 lumbar fusion on 01/01/17. -Low back pain: yes R-sided -Radiating pain: no -Aggravating factors: laying on her R side; walking for exercise -Treatments tried:Tylenol;   R foot pain: Hx of B foot pain and and B foot surgeries.  She had R foot surgery 5 years ago in which her toes were straightened.  She had more recent L foot surgery in Jan 2022.  She is having pain in her R 2nd and 3rd toes and callous on the plantar aspect of her R 2nd toe. -Swelling: no -Numbness/tingling: no -Aggravating factors: weight-bearing activity -Treatments tried: arch supports; prior R foot surgery; expensive/good shoewear; Tylenol  Pertinent review of Systems: No fevers or chills  Relevant historical information: History of foot surgeries as above.   Objective:    Vitals:   04/21/21 1032  BP: 120/76  Pulse: 80  SpO2: 97%   General: Well Developed, well nourished, and in no acute distress.   MSK: Right hip normal-appearing Normal motion. Tender palpation greater trochanter. Hip abduction and external rotation strength are diminished 4/5 with pain.  Right foot: Scar at first MTP second MTP.  At the plantar second metatarsal head mild callus formation.  Second metatarsal head on plantar aspect as prominent and tender to palpation.   Hip greater trochanteric injection: right Consent obtained and timeout performed. Area of maximum tenderness palpated and identified. Skin cleaned with alcohol, cold spray applied. A 22g needle was used to access the greater trochanteric bursa. 40mg  of Kenalog and 2 mL of Marcaine were used to inject the trochanteric bursa. Patient tolerated the procedure  well.    Impression and Recommendations:    Assessment and Plan: 60 y.o. female with  Right lateral hip pain due to greater trochanteric bursitis.  Plan to treat with injection today and home exercise program focused on hip abduction strengthening and stretching.  Recheck in 1 month.  If not improving or improved would consider physical therapy.   Right plantar foot pain due to metatarsalgia primarily at second metatarsal head.  Plan to treat with metatarsal pad.  Patient tolerated a small metatarsal pad (hapad brand) quite well.  This should be quite helpful.  Recheck in a month.    Discussed warning signs or symptoms. Please see discharge instructions. Patient expresses understanding.   The above documentation has been reviewed and is accurate and complete Lynne Leader, M.D.

## 2021-04-21 NOTE — Patient Instructions (Addendum)
Nice to meet you today.  You received a steroid injection today. Seek immediate medical attention if the joint becomes red, extremely painful, or is oozing fluid.   HAPAD metatarsal pad  Please perform the exercise program that we have prepared for you and gone over in detail on a daily basis.  In addition to the handout you were provided you can access your program through: [email or web address]   Your unique program code is:  [email or web address]  Follow-up: in 1 month

## 2021-04-28 DIAGNOSIS — F3171 Bipolar disorder, in partial remission, most recent episode hypomanic: Secondary | ICD-10-CM | POA: Diagnosis not present

## 2021-04-28 DIAGNOSIS — R69 Illness, unspecified: Secondary | ICD-10-CM | POA: Diagnosis not present

## 2021-05-15 NOTE — Progress Notes (Deleted)
° ° °  Whitney Lewis is a 60 y.o. female who presents to [employer] at [facility] today for f/u of R lateral hip/buttock and R foot pain.  She was last seen by Dr. Georgina Snell on 04/21/21 and had a R GT injection.  She was also shown a HEP focusing on hip aBd and glute strengthening and was provided w/ a MT pad for her shoes.   Today, pt reports     Pertinent review of systems: ***  Relevant historical information: ***   Exam:  There were no vitals taken for this visit. General: Well Developed, well nourished, and in no acute distress.   MSK: ***    Lab and Radiology Results No results found for this or any previous visit (from the past 72 hour(s)). No results found.     Assessment and Plan: 60 y.o. female with ***   PDMP not reviewed this encounter. No orders of the defined types were placed in this encounter.  No orders of the defined types were placed in this encounter.    Discussed warning signs or symptoms. Please see discharge instructions. Patient expresses understanding.   ***

## 2021-05-20 ENCOUNTER — Ambulatory Visit: Payer: Medicare HMO | Admitting: Family Medicine

## 2021-05-20 DIAGNOSIS — R69 Illness, unspecified: Secondary | ICD-10-CM | POA: Diagnosis not present

## 2021-05-20 DIAGNOSIS — F3171 Bipolar disorder, in partial remission, most recent episode hypomanic: Secondary | ICD-10-CM | POA: Diagnosis not present

## 2021-05-21 ENCOUNTER — Ambulatory Visit: Payer: Medicare HMO | Admitting: Family Medicine

## 2021-05-21 ENCOUNTER — Other Ambulatory Visit: Payer: Self-pay

## 2021-05-21 VITALS — BP 128/78 | HR 91 | Ht 66.0 in | Wt 149.4 lb

## 2021-05-21 DIAGNOSIS — M7741 Metatarsalgia, right foot: Secondary | ICD-10-CM | POA: Diagnosis not present

## 2021-05-21 DIAGNOSIS — M7061 Trochanteric bursitis, right hip: Secondary | ICD-10-CM | POA: Diagnosis not present

## 2021-05-21 NOTE — Progress Notes (Signed)
° °  I, Peterson Lombard, LAT, ATC acting as a scribe for Lynne Leader, MD.  [place] L Koloski is a 60 y.o. female who presents to [place] at [facility] today for f/u R lateral hip pain and R metatarsalgia. Pt was last seen by Dr. Georgina Snell on 04/21/21 and was given a R GT steroid injection and was taught a HEP. Today, pt reports the R hip is feeling much better and she has been working on her HEP.  He does her home exercise program usually twice daily but almost always at least once daily.  Pt also reports R foot is doing well. Pt has been wearing the scaphoid pad or arch support and often a metatarsal pad.  Dx imaging: 01/01/17 L-spine XR  Pertinent review of systems: No fevers or chills  Relevant historical information: Sleep apnea.   Exam:  BP 128/78    Pulse 91    Ht 5\' 6"  (1.676 m)    Wt 149 lb 6.4 oz (67.8 kg)    SpO2 99%    BMI 24.11 kg/m  General: Well Developed, well nourished, and in no acute distress.   MSK: Normal gait      Assessment and Plan: 60 y.o. female with right lateral hip pain due to trochanteric bursitis and right metatarsalgia.  Trochanteric bursitis significantly improved following injection and home exercise program.  Continue home exercise program.  Happy to authorize physical therapy if needed in the future.  She lives in [place] so would pick the [place] based location.  Metatarsalgia.  Significantly improved with arch support and metatarsal pads.  Discussed various options including individual pads and integrated insoles.  She can purchase these at [employer] or online.  Discussed precautions.  Check back as needed. Total encounter time 20 minutes including face-to-face time with the patient and, reviewing past medical record, and charting on the date of service.     Discussed warning signs or symptoms. Please see discharge instructions. Patient expresses understanding.   The above documentation has been reviewed and is accurate  and complete Lynne Leader, M.D.

## 2021-05-21 NOTE — Patient Instructions (Signed)
Thank you for coming in today.   Have a happy New Year!  Recheck back as needed

## 2021-05-22 ENCOUNTER — Ambulatory Visit: Payer: Medicare HMO | Admitting: Family Medicine

## 2021-06-22 ENCOUNTER — Encounter: Payer: Self-pay | Admitting: Family Medicine

## 2021-06-22 DIAGNOSIS — M7061 Trochanteric bursitis, right hip: Secondary | ICD-10-CM

## 2021-07-04 ENCOUNTER — Ambulatory Visit: Payer: PPO | Admitting: Physical Therapy

## 2021-07-09 ENCOUNTER — Other Ambulatory Visit: Payer: Self-pay

## 2021-07-09 ENCOUNTER — Ambulatory Visit: Payer: PPO | Admitting: Physical Therapy

## 2021-07-09 ENCOUNTER — Encounter: Payer: Self-pay | Admitting: Physical Therapy

## 2021-07-09 DIAGNOSIS — M545 Low back pain, unspecified: Secondary | ICD-10-CM

## 2021-07-09 DIAGNOSIS — M25572 Pain in left ankle and joints of left foot: Secondary | ICD-10-CM | POA: Diagnosis not present

## 2021-07-09 DIAGNOSIS — M25551 Pain in right hip: Secondary | ICD-10-CM | POA: Diagnosis not present

## 2021-07-09 NOTE — Therapy (Signed)
OUTPATIENT PHYSICAL THERAPY THORACOLUMBAR EVALUATION   Patient Name: Whitney Lewis MRN: [id number] DOB:Jul 25, 1960, 61 y.o., female Today's Date: 07/09/2021   PT End of Session - 07/09/21 1325     Visit Number 1    Number of Visits 12    Date for PT Re-Evaluation 08/20/21    Authorization Type HTA    PT Start Time 1215    PT Stop Time 1305    PT Time Calculation (min) 50 min    Activity Tolerance Patient tolerated treatment well    Behavior During Therapy [facility] for tasks assessed/performed             Past Medical History:  Diagnosis Date   ADD (attention deficit disorder)    Anxiety    Arthritis    feet, knees (07/04/2015)   Bipolar disorder ([place])    Complication of anesthesia    hard to wake up, blood pressure drops    Depression    Family history of adverse reaction to anesthesia    "sister hard to wake up also"   Fibromyalgia dx'd ~ 2000   GERD (gastroesophageal reflux disease)    Hyperlipidemia    Hypertension    Migraine    "hormonal; stopped when I was 16" (07/04/2015)   OSA (obstructive sleep apnea)    wears mouth piece, no CPAP   Seizures (H[facility])    Past Surgical History:  Procedure Laterality Date   ABDOMINAL HYSTERECTOMY  ~ [place] WITH PROPOFOL N/A 09/21/2017   Procedure: COLONOSCOPY WITH PROPOFOL;  Surgeon: Lollie Sails, MD;  Location: [facility] ENDOSCOPY;  Service: Endoscopy;  Laterality: N/A;   HAMMER TOE SURGERY Right 03/2015   HAMMER TOE SURGERY Left 06/06/2015   Procedure: LEFT TWO-THREE  HAMMER TOE CORRECTION ;  Surgeon: [name], MD;  Location: [place];  Service: Orthopedics;  Laterality: Left;   INCISION AND DRAINAGE OF WOUND Right 07/04/2015   Procedure: IRRIGATION AND DEBRIDEMENT OF RIGHT FOOT DEEP ABSCESS; EXCISION OF RIGHT SECOND METATARSAL HEAD; REMOVAL OF DEEP IMPLANT RIGHT FOOT; EXCISION OF RIGHT SECOND TOE PROXIMAL PHALANX BASE; INSERTION OF  ANTIBIOTIC BEADS INTO RIGHT FOOT ABSCESS;  Surgeon: [name], MD;  Location: [place];  Service: Orthopedics;  Laterality: Right;  SITE ALSO  RIGHT FOOT   IRRIGATION AND DEBRIDEMENT FOOT Right 07/04/2015   Irrigation and excisional debridement of right foot deep abscess;; Insertion of antibiotic beads into the right foot abscess   JOINT REPLACEMENT     MAXIMUM ACCESS (MAS)POSTERIOR LUMBAR INTERBODY FUSION (PLIF) 1 LEVEL N/A 01/01/2017   Procedure: L4-5 MAXIMUM ACCESS (MAS) POSTERIOR LUMBAR INTERBODY FUSION (PLIF);  Surgeon: Eustace Moore, MD;  Location: [place];  Service: Neurosurgery;  Laterality: N/A;   TOTAL KNEE ARTHROPLASTY Left    WEIL OSTEOTOMY Left 06/06/2015   Procedure: LEFT TWO AND THREE METATARSAL WEIL OSTEOTOMY ;  Surgeon: [name], MD;  Location: [place];  Service: Orthopedics;  Laterality: Left;   Patient Active Problem List   Diagnosis Date Noted   S/P lumbar spinal fusion 01/01/2017   ADD (attention deficit disorder) 07/17/2015   Bipolar affective disorder ([place]) 07/17/2015   HLD (hyperlipidemia) 07/17/2015   BP (high blood pressure) 07/17/2015   Obstructive apnea 07/17/2015   Staphylococcus aureus infection    Foot osteomyelitis, right ([place]) 07/04/2015    PCP: Velna Hatchet, MD  REFERRING PROVIDER: Lynne Leader  REFERRING  DIAG: R hip pain  THERAPY DIAG:  Acute right-sided low back pain without sciatica  Pain in right hip  Pain in left ankle and joints of left foot  ONSET DATE:  1 month ago.   SUBJECTIVE:                                                                                                                                                                                           SUBJECTIVE STATEMENT: L hip has been sore in the last year. She did have cortisone injection in January- did help. Back has also been much more sore now in the last couple weeks.  She has extensive surgical history for feet/toes with multiple surgeries for  bunions and hammer toes.  Thinks R LE is shorter.  Used to walk 3-5 mi/day. Now can not due to pain  Still having some issues with foot pain, L 5th , and R 2nd.     PERTINENT HISTORY:  L TKA,  Lumbar fustonL4/5 ,   PAIN:  Are you having pain? Yes NPRS scale:6-7 /10 Pain location: Hip and low back  Pain orientation: Right  PAIN TYPE: aching Pain description: intermittent  Aggravating factors: sleeping, standing, walking, lifitng leg Relieving factors: rest     PRECAUTIONS: None  WEIGHT BEARING RESTRICTIONS No  FALLS:  Has patient fallen in last 6 months? No, Number of falls: 0  OCCUPATION: Retired,   PLOF: Independent  PATIENT GOALS  Return to regular exercise, walking.    OBJECTIVE:   DIAGNOSTIC FINDINGS: none for hip or back.   COGNITION:  Overall cognitive status: Within functional limits for tasks assessed  POSTURE:  Standing: R hip lower, 2nd toe with some clawing/extension, wide forefoot;   Supine: R LE shorter,   PALPATION: L foot: soreness with light touch and palpation of top of 5th toe R foot: pain with palpation of plantar  2-3 region, 2nd toe with some clawing/extension- possible plantar plate pathology, also has palpable area, likely hardware on dorsal surface-may require further assessment from foot MD.  R hip: tenderness in posterior gr troch, into glute, pain in R SI   ROM AROM-   Lumbar: WFL,  Hips: mild limitation for R flexion/ rotation,  L knee: lacking full flexion (from previous TKA) ; Limited DF bil;    MMT: Hips: 4/5 (pain with testing on R),  Knees: 4+5,    LUMBAR SPECIAL TESTS:  Neg SLR bil;    GAIT: Decreased hip ext on R with decreased terminal stance position and DF; mild increase in hip /lumbar rotation on R with terminal stance;     TODAY'S TREATMENT  See below for HEP   PATIENT EDUCATION:  Education details: PT POC, Exam findings, HEP Person educated: Patient Education method: Explanation, Demonstration, Tactile  cues, Verbal cues, and Handouts Education comprehension: verbalized understanding, returned demonstration, verbal cues required, tactile cues required, and needs further education   HOME EXERCISE PROGRAM: Access Code: [email or web address] URL: [email or web address] Date: 07/09/2021 Prepared by: Lyndee Hensen  Exercises Single Knee to Chest Stretch - 2 x daily - 3 reps - 30 hold Supine Lower Trunk Rotation - 2 x daily - 10 reps - 5 hold Supine Figure 4 Piriformis Stretch - 2 x daily - 3 reps - 30 hold Supine Figure 4 Piriformis Stretch with Leg Extension - 2 x daily - 3 reps - 30 hold Sidelying Hip Abduction - 2 x daily - 2 sets - 10 reps   ASSESSMENT:  CLINICAL IMPRESSION: Pt presents with primary complaint of increased pain in R hip. She has pain and tenderness in R SI and into R glute. She has mild leg length discrepancy, with R being shorter than L. She has had multiple foot surgeries for toes. She has weakness and instability in feet and toes, that are causing some gait deficits and may be causing some proximal instability(hip pain)  as well. Pt to benefit from education on strengthening for foot/ankle, as well as education on optimal footwear, and possible heel lift for leg length. Pt to benefit from strengthening, stability and manual for R hip pain. Pt to benefit from skilled PT to improve deficits and pain.    OBJECTIVE IMPAIRMENTS Abnormal gait, decreased activity tolerance, decreased balance, decreased knowledge of use of DME, decreased mobility, difficulty walking, decreased ROM, decreased strength, increased muscle spasms, impaired flexibility, improper body mechanics, and pain.   ACTIVITY LIMITATIONS cleaning, community activity, meal prep, laundry, and yard work.   PERSONAL FACTORS Past/current experiences /foot surgeries and deficits are also affecting patient's functional outcome.    REHAB POTENTIAL: Good  CLINICAL DECISION MAKING:  Stable/uncomplicated  EVALUATION COMPLEXITY: Low   GOALS: Goals reviewed with patient? Yes  SHORT TERM GOALS:  STG Name Target Date Goal status  1 Patient will be independent with initial HEP   07/23/21 INITIAL              LONG TERM GOALS:   LTG Name Target Date Goal status  1 Patient will be independent with final HEP  08/20/21 INITIAL  2 Pt to report decreased pain in R hip to be to 0-2/10 with standing activity   08/20/21 INITIAL  3 Pt to demonstrate improved strength of R hip to be at least 4+/5, to improve ability for gait and stairs   08/20/21 INITIAL  4 Pt to demonstrate optimal mechanics with hips and feet during gait cycle, with foot and hip stability WNL   08/20/21 INITIAL  5 Pt to be compliant with optimal footwear for foot type and diagnosis.  08/20/21 INITIAL     PLAN: PT FREQUENCY: 1-2x/week  PT DURATION: 6 weeks  PLANNED INTERVENTIONS: Therapeutic exercises, Therapeutic activity, Neuro Muscular re-education, Balance training, Gait training, Patient/Family education, Joint mobilization, Stair training, DME instructions, Dry Needling, Electrical stimulation, Spinal mobilization, Moist heat, Taping, Traction, Ultrasound, Ionotophoresis 4mg /ml Dexamethasone, and Manual therapy  PLAN FOR NEXT SESSION:   Lyndee Hensen, PT, DPT 1:45 PM  07/09/21

## 2021-07-11 ENCOUNTER — Encounter: Payer: Self-pay | Admitting: Physical Therapy

## 2021-07-11 ENCOUNTER — Ambulatory Visit: Payer: PPO | Admitting: Physical Therapy

## 2021-07-11 ENCOUNTER — Other Ambulatory Visit: Payer: Self-pay

## 2021-07-11 DIAGNOSIS — M25551 Pain in right hip: Secondary | ICD-10-CM

## 2021-07-11 DIAGNOSIS — M545 Low back pain, unspecified: Secondary | ICD-10-CM | POA: Diagnosis not present

## 2021-07-11 DIAGNOSIS — M25572 Pain in left ankle and joints of left foot: Secondary | ICD-10-CM

## 2021-07-11 NOTE — Therapy (Signed)
OUTPATIENT PHYSICAL THERAPY TREATMENT   Patient Name: [place] L Tennyson MRN: [id number] DOB:Apr 23, 1961, 61 y.o., female Today's Date: 07/11/2021   PT End of Session - 07/11/21 1205     Visit Number 2    Number of Visits 12    Date for PT Re-Evaluation 08/20/21    Authorization Type HTA    PT Start Time 0940    PT Stop Time 1020    PT Time Calculation (min) 40 min    Activity Tolerance Patient tolerated treatment well    Behavior During Therapy [facility] for tasks assessed/performed              Past Medical History:  Diagnosis Date   ADD (attention deficit disorder)    Anxiety    Arthritis    feet, knees (07/04/2015)   Bipolar disorder ([place])    Complication of anesthesia    hard to wake up, blood pressure drops    Depression    Family history of adverse reaction to anesthesia    "sister hard to wake up also"   Fibromyalgia dx'd ~ 2000   GERD (gastroesophageal reflux disease)    Hyperlipidemia    Hypertension    Migraine    "hormonal; stopped when I was 16" (07/04/2015)   OSA (obstructive sleep apnea)    wears mouth piece, no CPAP   Seizures (HCC)    Past Surgical History:  Procedure Laterality Date   ABDOMINAL HYSTERECTOMY  ~ [place] WITH PROPOFOL N/A 09/21/2017   Procedure: COLONOSCOPY WITH PROPOFOL;  Surgeon: Lollie Sails, MD;  Location: [facility] ENDOSCOPY;  Service: Endoscopy;  Laterality: N/A;   HAMMER TOE SURGERY Right 03/2015   HAMMER TOE SURGERY Left 06/06/2015   Procedure: LEFT TWO-THREE  HAMMER TOE CORRECTION ;  Surgeon: [name], MD;  Location: [place];  Service: Orthopedics;  Laterality: Left;   INCISION AND DRAINAGE OF WOUND Right 07/04/2015   Procedure: IRRIGATION AND DEBRIDEMENT OF RIGHT FOOT DEEP ABSCESS; EXCISION OF RIGHT SECOND METATARSAL HEAD; REMOVAL OF DEEP IMPLANT RIGHT FOOT; EXCISION OF RIGHT SECOND TOE PROXIMAL PHALANX BASE; INSERTION OF ANTIBIOTIC BEADS  INTO RIGHT FOOT ABSCESS;  Surgeon: [name], MD;  Location: [place];  Service: Orthopedics;  Laterality: Right;  SITE ALSO  RIGHT FOOT   IRRIGATION AND DEBRIDEMENT FOOT Right 07/04/2015   Irrigation and excisional debridement of right foot deep abscess;; Insertion of antibiotic beads into the right foot abscess   JOINT REPLACEMENT     MAXIMUM ACCESS (MAS)POSTERIOR LUMBAR INTERBODY FUSION (PLIF) 1 LEVEL N/A 01/01/2017   Procedure: L4-5 MAXIMUM ACCESS (MAS) POSTERIOR LUMBAR INTERBODY FUSION (PLIF);  Surgeon: Eustace Moore, MD;  Location: [place];  Service: Neurosurgery;  Laterality: N/A;   TOTAL KNEE ARTHROPLASTY Left    WEIL OSTEOTOMY Left 06/06/2015   Procedure: LEFT TWO AND THREE METATARSAL WEIL OSTEOTOMY ;  Surgeon: [name], MD;  Location: [place];  Service: Orthopedics;  Laterality: Left;   Patient Active Problem List   Diagnosis Date Noted   S/P lumbar spinal fusion 01/01/2017   ADD (attention deficit disorder) 07/17/2015   Bipolar affective disorder ([place]) 07/17/2015   HLD (hyperlipidemia) 07/17/2015   BP (high blood pressure) 07/17/2015   Obstructive apnea 07/17/2015   Staphylococcus aureus infection    Foot osteomyelitis, right ([place]) 07/04/2015    PCP: Velna Hatchet, MD  REFERRING PROVIDER: Lynne Leader  REFERRING  DIAG: R hip pain  THERAPY DIAG:  Pain in right hip  Acute right-sided low back pain without sciatica  Pain in left ankle and joints of left foot  ONSET DATE:  1 month ago.   SUBJECTIVE:                                                                                                                                                                                           SUBJECTIVE STATEMENT: Pt obtained new shoes, wearing with good fit and comfort.    PERTINENT HISTORY:  L TKA,  Lumbar fustonL4/5 ,   PAIN:  Are you having pain? Yes NPRS scale:6-7 /10 Pain location: Hip and low back  Pain orientation: Right  PAIN TYPE: aching Pain  description: intermittent  Aggravating factors: sleeping, standing, walking, lifitng leg Relieving factors: rest     PRECAUTIONS: None  WEIGHT BEARING RESTRICTIONS No  FALLS:  Has patient fallen in last 6 months? No, Number of falls: 0  OCCUPATION: Retired,   PLOF: Independent  PATIENT GOALS  Return to regular exercise, walking.    OBJECTIVE:   DIAGNOSTIC FINDINGS: none for hip or back.   COGNITION:  Overall cognitive status: Within functional limits for tasks assessed  POSTURE:  Standing: R hip lower, 2nd toe with some clawing/extension, wide forefoot;   Supine: R LE shorter,   PALPATION: Eval: L foot: soreness with light touch and palpation of top of 5th toe R foot: pain with palpation of plantar  2-3 region, 2nd toe with some clawing/extension- possible plantar plate pathology, also has palpable area, likely hardware on dorsal surface-may require further assessment from foot MD.  R hip: tenderness in posterior gr troch, into glute, pain in R SI   ROM Eval AROM-   Lumbar: WFL,  Hips: mild limitation for R flexion/ rotation,  L knee: lacking full flexion (from previous TKA) ; Limited DF bil;   MMT: Eval Hips: 4/5 (pain with testing on R),  Knees: 4+5,    GAIT:Eval Decreased hip ext on R with decreased terminal stance position and DF; mild increase in hip /lumbar rotation on R with terminal stance;     TODAY'S TREATMENT    07/11/21: Therapeutic Exercise: Aerobic: Supine: Bridging x 20; SLR x 10 bil;  S/L:  hip abd x15 bil; clams x 15 bil;  Seated: Standing:  Heel raises x 20 with education on form ;  SLS x1 min bil with education on toe position Stretches: ITB stretch with strap 30 sec x 3 on R; Hip IR across body 30 sec x 3; Gastroc at wall x 20, x 10 with rotation Neuromuscular Re-education:  Manual Therapy: Therapeutic Activity: Self Care: Education and discussion on optimal footwear, assessed new sneakers today, assessed current and new orthotics,  discussed met pad use. Added heel lift (1 level EVA foam) to R shoe- for leg length;    PATIENT EDUCATION:  Education details: Updated and reviewed HEP, discussed footwear and orthotics- see self care.  Person educated: Patient Education method: Explanation, Demonstration, Tactile cues, Verbal cues, and Handouts Education comprehension: verbalized understanding, returned demonstration, verbal cues required, tactile cues required, and needs further education   HOME EXERCISE PROGRAM: Access Code: [email or web address]   ASSESSMENT:  CLINICAL IMPRESSION: 07/11/21: Pt wearing new shoes with good fit and comfort. Decided to keep older orthotic for now due to wider/deeper heel cup. Did add heel lift to R shoe for leg length- will continue to assess next visit. Held on adding met pad today, due to making several other changes with shoes and feet today. May need to add in future. Education on optimal form for gastroc stretching and heel raises today, pt will require continued strengthening and stability for feet and foot pain. Added strengthening for R hip, pt challenged due to weakness, will benefit from progressive strengthening.    OBJECTIVE IMPAIRMENTS Abnormal gait, decreased activity tolerance, decreased balance, decreased knowledge of use of DME, decreased mobility, difficulty walking, decreased ROM, decreased strength, increased muscle spasms, impaired flexibility, improper body mechanics, and pain.   ACTIVITY LIMITATIONS cleaning, community activity, meal prep, laundry, and yard work.   PERSONAL FACTORS Past/current experiences /foot surgeries and deficits are also affecting patient's functional outcome.    REHAB POTENTIAL: Good  CLINICAL DECISION MAKING: Stable/uncomplicated  EVALUATION COMPLEXITY: Low   GOALS: Goals reviewed with patient? Yes  SHORT TERM GOALS:  STG Name Target Date Goal status  1 Patient will be independent with initial HEP   07/23/21 INITIAL              LONG TERM  GOALS:   LTG Name Target Date Goal status  1 Patient will be independent with final HEP  08/20/21 INITIAL  2 Pt to report decreased pain in R hip to be to 0-2/10 with standing activity   08/20/21 INITIAL  3 Pt to demonstrate improved strength of R hip to be at least 4+/5, to improve ability for gait and stairs   08/20/21 INITIAL  4 Pt to demonstrate optimal mechanics with hips and feet during gait cycle, with foot and hip stability WNL   08/20/21 INITIAL  5 Pt to be compliant with optimal footwear for foot type and diagnosis.  08/20/21 INITIAL     PLAN: PT FREQUENCY: 1-2x/week  PT DURATION: 6 weeks  PLANNED INTERVENTIONS: Therapeutic exercises, Therapeutic activity, Neuro Muscular re-education, Balance training, Gait training, Patient/Family education, Joint mobilization, Stair training, DME instructions, Dry Needling, Electrical stimulation, Spinal mobilization, Moist heat, Taping, Traction, Ultrasound, Ionotophoresis 28m/ml Dexamethasone, and Manual therapy  PLAN FOR NEXT SESSION:   LLyndee Hensen PT, DPT 12:06 PM  07/11/21

## 2021-07-16 ENCOUNTER — Other Ambulatory Visit: Payer: Self-pay

## 2021-07-16 ENCOUNTER — Ambulatory Visit: Payer: PPO | Admitting: Physical Therapy

## 2021-07-16 DIAGNOSIS — M25551 Pain in right hip: Secondary | ICD-10-CM | POA: Diagnosis not present

## 2021-07-16 DIAGNOSIS — M25572 Pain in left ankle and joints of left foot: Secondary | ICD-10-CM | POA: Diagnosis not present

## 2021-07-16 DIAGNOSIS — M545 Low back pain, unspecified: Secondary | ICD-10-CM | POA: Diagnosis not present

## 2021-07-16 NOTE — Therapy (Signed)
OUTPATIENT PHYSICAL THERAPY TREATMENT   Patient Name: Whitney Lewis MRN: [id number] DOB:January 07, 1961, 61 y.o., female Today's Date: 07/16/2021   PT End of Session - 07/22/21 2020     Visit Number 3    Number of Visits 12    Date for PT Re-Evaluation 08/20/21    Authorization Type HTA    PT Start Time 0802    PT Stop Time 0844    PT Time Calculation (min) 42 min    Activity Tolerance Patient tolerated treatment well    Behavior During Therapy [facility] for tasks assessed/performed               Past Medical History:  Diagnosis Date   ADD (attention deficit disorder)    Anxiety    Arthritis    feet, knees (07/04/2015)   Bipolar disorder ([place])    Complication of anesthesia    hard to wake up, blood pressure drops    Depression    Family history of adverse reaction to anesthesia    "sister hard to wake up also"   Fibromyalgia dx'd ~ 2000   GERD (gastroesophageal reflux disease)    Hyperlipidemia    Hypertension    Migraine    "hormonal; stopped when I was 16" (07/04/2015)   OSA (obstructive sleep apnea)    wears mouth piece, no CPAP   Seizures (HCC)    Past Surgical History:  Procedure Laterality Date   ABDOMINAL HYSTERECTOMY  ~ [place] WITH PROPOFOL N/A 09/21/2017   Procedure: COLONOSCOPY WITH PROPOFOL;  Surgeon: Lollie Sails, MD;  Location: [facility] ENDOSCOPY;  Service: Endoscopy;  Laterality: N/A;   HAMMER TOE SURGERY Right 03/2015   HAMMER TOE SURGERY Left 06/06/2015   Procedure: LEFT TWO-THREE  HAMMER TOE CORRECTION ;  Surgeon: [name], MD;  Location: [place];  Service: Orthopedics;  Laterality: Left;   INCISION AND DRAINAGE OF WOUND Right 07/04/2015   Procedure: IRRIGATION AND DEBRIDEMENT OF RIGHT FOOT DEEP ABSCESS; EXCISION OF RIGHT SECOND METATARSAL HEAD; REMOVAL OF DEEP IMPLANT RIGHT FOOT; EXCISION OF RIGHT SECOND TOE PROXIMAL PHALANX BASE; INSERTION OF ANTIBIOTIC BEADS  INTO RIGHT FOOT ABSCESS;  Surgeon: [name], MD;  Location: [place];  Service: Orthopedics;  Laterality: Right;  SITE ALSO  RIGHT FOOT   IRRIGATION AND DEBRIDEMENT FOOT Right 07/04/2015   Irrigation and excisional debridement of right foot deep abscess;; Insertion of antibiotic beads into the right foot abscess   JOINT REPLACEMENT     MAXIMUM ACCESS (MAS)POSTERIOR LUMBAR INTERBODY FUSION (PLIF) 1 LEVEL N/A 01/01/2017   Procedure: L4-5 MAXIMUM ACCESS (MAS) POSTERIOR LUMBAR INTERBODY FUSION (PLIF);  Surgeon: Eustace Moore, MD;  Location: [place];  Service: Neurosurgery;  Laterality: N/A;   TOTAL KNEE ARTHROPLASTY Left    WEIL OSTEOTOMY Left 06/06/2015   Procedure: LEFT TWO AND THREE METATARSAL WEIL OSTEOTOMY ;  Surgeon: [name], MD;  Location: [place];  Service: Orthopedics;  Laterality: Left;   Patient Active Problem List   Diagnosis Date Noted   S/P lumbar spinal fusion 01/01/2017   ADD (attention deficit disorder) 07/17/2015   Bipolar affective disorder ([place]) 07/17/2015   HLD (hyperlipidemia) 07/17/2015   BP (high blood pressure) 07/17/2015   Obstructive apnea 07/17/2015   Staphylococcus aureus infection    Foot osteomyelitis, right ([place]) 07/04/2015    PCP: Velna Hatchet, MD  REFERRING PROVIDER: Lynne Leader  REFERRING DIAG: R hip pain  THERAPY DIAG:  Pain in right hip  Acute right-sided low back pain without sciatica  Pain in left ankle and joints of left foot  ONSET DATE:  1 month ago.   SUBJECTIVE:                                                                                                                                                                                           SUBJECTIVE STATEMENT: Pt states increased soreness in R low back/hip today and last couple days.    PERTINENT HISTORY:  L TKA,  Lumbar fustonL4/5 ,   PAIN:  Are you having pain? Yes NPRS scale:6-7 /10 Pain location: Hip and low back  Pain orientation: Right  PAIN  TYPE: aching Pain description: intermittent  Aggravating factors: sleeping, standing, walking, lifitng leg Relieving factors: rest     PRECAUTIONS: None  WEIGHT BEARING RESTRICTIONS No  FALLS:  Has patient fallen in last 6 months? No, Number of falls: 0  OCCUPATION: Retired,   PLOF: Independent  PATIENT GOALS  Return to regular exercise, walking.    OBJECTIVE:   DIAGNOSTIC FINDINGS: none for hip or back.   COGNITION:  Overall cognitive status: Within functional limits for tasks assessed  POSTURE:  Standing: R hip lower, 2nd toe with some clawing/extension, wide forefoot;   Supine: R LE shorter,   PALPATION: Eval: L foot: soreness with light touch and palpation of top of 5th toe R foot: pain with palpation of plantar  2-3 region, 2nd toe with some clawing/extension- possible plantar plate pathology, also has palpable area, likely hardware on dorsal surface-may require further assessment from foot MD.  R hip: tenderness in posterior gr troch, into glute, pain in R SI   ROM Eval AROM-   Lumbar: WFL,  Hips: mild limitation for R flexion/ rotation,  L knee: lacking full flexion (from previous TKA) ; Limited DF bil;   MMT: Eval Hips: 4/5 (pain with testing on R),  Knees: 4+5,    GAIT:Eval Decreased hip ext on R with decreased terminal stance position and DF; mild increase in hip /lumbar rotation on R with terminal stance;     TODAY'S TREATMENT   07/16/21: Therapeutic Exercise: Aerobic: Recumbent bike L1 x 5 min;  Supine: Bridging x 20;  S/L:  hip abd x20 bil; clams x 20 bil;  Seated: Standing:  Heel raises x 20 with education on form;  Hip ext and abd 2x10 ea bil;  Stretches:  Gastroc at wall x 20, x 10  Neuromuscular Re-education: Manual Therapy: Therapeutic Activity: Self Care:      07/11/21: Therapeutic Exercise:  Aerobic: Supine: Bridging x 20; SLR x 10 bil;  S/L:  hip abd x15 bil; clams x 15 bil;  Seated: Standing:  Heel raises x 20 with education on  form ;  SLS x1 min bil with education on toe position Stretches: ITB stretch with strap 30 sec x 3 on R; Hip IR across body 30 sec x 3; Gastroc at wall x 20, x 10 with rotation Neuromuscular Re-education: Manual Therapy: Therapeutic Activity: Self Care: Education and discussion on optimal footwear, assessed new sneakers today, assessed current and new orthotics, discussed met pad use. Added heel lift (1 level EVA foam) to R shoe- for leg length;    PATIENT EDUCATION:  Education details: Updated and reviewed HEP, discussed footwear and orthotics- see self care.  Person educated: Patient Education method: Explanation, Demonstration, Tactile cues, Verbal cues, and Handouts Education comprehension: verbalized understanding, returned demonstration, verbal cues required, tactile cues required, and needs further education   HOME EXERCISE PROGRAM: Access Code: [email or web address]   ASSESSMENT:  CLINICAL IMPRESSION: 07/16/21: Pt able to progress strengthening today, mild soreness in hip. Pt with less pain after session today, reviewed HEP. Plan to continue strength as tolerated.    OBJECTIVE IMPAIRMENTS Abnormal gait, decreased activity tolerance, decreased balance, decreased knowledge of use of DME, decreased mobility, difficulty walking, decreased ROM, decreased strength, increased muscle spasms, impaired flexibility, improper body mechanics, and pain.   ACTIVITY LIMITATIONS cleaning, community activity, meal prep, laundry, and yard work.   PERSONAL FACTORS Past/current experiences /foot surgeries and deficits are also affecting patient's functional outcome.    REHAB POTENTIAL: Good  CLINICAL DECISION MAKING: Stable/uncomplicated  EVALUATION COMPLEXITY: Low   GOALS: Goals reviewed with patient? Yes  SHORT TERM GOALS:  STG Name Target Date Goal status  1 Patient will be independent with initial HEP   07/23/21 INITIAL              LONG TERM GOALS:   LTG Name Target Date Goal status  1  Patient will be independent with final HEP  08/20/21 INITIAL  2 Pt to report decreased pain in R hip to be to 0-2/10 with standing activity   08/20/21 INITIAL  3 Pt to demonstrate improved strength of R hip to be at least 4+/5, to improve ability for gait and stairs   08/20/21 INITIAL  4 Pt to demonstrate optimal mechanics with hips and feet during gait cycle, with foot and hip stability WNL   08/20/21 INITIAL  5 Pt to be compliant with optimal footwear for foot type and diagnosis.  08/20/21 INITIAL     PLAN: PT FREQUENCY: 1-2x/week  PT DURATION: 6 weeks  PLANNED INTERVENTIONS: Therapeutic exercises, Therapeutic activity, Neuro Muscular re-education, Balance training, Gait training, Patient/Family education, Joint mobilization, Stair training, DME instructions, Dry Needling, Electrical stimulation, Spinal mobilization, Moist heat, Taping, Traction, Ultrasound, Ionotophoresis 24m/ml Dexamethasone, and Manual therapy  PLAN FOR NEXT SESSION:   LLyndee Hensen PT, DPT 8:21 PM  07/22/21

## 2021-07-22 ENCOUNTER — Encounter: Payer: Self-pay | Admitting: Physical Therapy

## 2021-07-23 ENCOUNTER — Encounter: Payer: Self-pay | Admitting: Physical Therapy

## 2021-07-23 ENCOUNTER — Ambulatory Visit: Payer: PPO | Admitting: Physical Therapy

## 2021-07-23 ENCOUNTER — Other Ambulatory Visit: Payer: Self-pay

## 2021-07-23 DIAGNOSIS — M25551 Pain in right hip: Secondary | ICD-10-CM

## 2021-07-23 DIAGNOSIS — M545 Low back pain, unspecified: Secondary | ICD-10-CM | POA: Diagnosis not present

## 2021-07-23 DIAGNOSIS — M25572 Pain in left ankle and joints of left foot: Secondary | ICD-10-CM | POA: Diagnosis not present

## 2021-07-23 NOTE — Therapy (Signed)
OUTPATIENT PHYSICAL THERAPY TREATMENT   Patient Name: Whitney Lewis MRN: [id number] DOB:1961/03/18, 61 y.o., female Today's Date: 07/23/2021    PT End of Session - 07/23/21 0934     Visit Number 4    Number of Visits 12    Date for PT Re-Evaluation 08/20/21    Authorization Type HTA    PT Start Time 0847    PT Stop Time 0930    PT Time Calculation (min) 43 min    Activity Tolerance Patient tolerated treatment well    Behavior During Therapy [facility] for tasks assessed/performed                Past Medical History:  Diagnosis Date   ADD (attention deficit disorder)    Anxiety    Arthritis    feet, knees (07/04/2015)   Bipolar disorder ([place])    Complication of anesthesia    hard to wake up, blood pressure drops    Depression    Family history of adverse reaction to anesthesia    "sister hard to wake up also"   Fibromyalgia dx'd ~ 2000   GERD (gastroesophageal reflux disease)    Hyperlipidemia    Hypertension    Migraine    "hormonal; stopped when I was 16" (07/04/2015)   OSA (obstructive sleep apnea)    wears mouth piece, no CPAP   Seizures (HCC)    Past Surgical History:  Procedure Laterality Date   ABDOMINAL HYSTERECTOMY  ~ [place] WITH PROPOFOL N/A 09/21/2017   Procedure: COLONOSCOPY WITH PROPOFOL;  Surgeon: Lollie Sails, MD;  Location: [facility] ENDOSCOPY;  Service: Endoscopy;  Laterality: N/A;   HAMMER TOE SURGERY Right 03/2015   HAMMER TOE SURGERY Left 06/06/2015   Procedure: LEFT TWO-THREE  HAMMER TOE CORRECTION ;  Surgeon: [name], MD;  Location: [place];  Service: Orthopedics;  Laterality: Left;   INCISION AND DRAINAGE OF WOUND Right 07/04/2015   Procedure: IRRIGATION AND DEBRIDEMENT OF RIGHT FOOT DEEP ABSCESS; EXCISION OF RIGHT SECOND METATARSAL HEAD; REMOVAL OF DEEP IMPLANT RIGHT FOOT; EXCISION OF RIGHT SECOND TOE PROXIMAL PHALANX BASE; INSERTION OF ANTIBIOTIC  BEADS INTO RIGHT FOOT ABSCESS;  Surgeon: [name], MD;  Location: [place];  Service: Orthopedics;  Laterality: Right;  SITE ALSO  RIGHT FOOT   IRRIGATION AND DEBRIDEMENT FOOT Right 07/04/2015   Irrigation and excisional debridement of right foot deep abscess;; Insertion of antibiotic beads into the right foot abscess   JOINT REPLACEMENT     MAXIMUM ACCESS (MAS)POSTERIOR LUMBAR INTERBODY FUSION (PLIF) 1 LEVEL N/A 01/01/2017   Procedure: L4-5 MAXIMUM ACCESS (MAS) POSTERIOR LUMBAR INTERBODY FUSION (PLIF);  Surgeon: Eustace Moore, MD;  Location: [place];  Service: Neurosurgery;  Laterality: N/A;   TOTAL KNEE ARTHROPLASTY Left    WEIL OSTEOTOMY Left 06/06/2015   Procedure: LEFT TWO AND THREE METATARSAL WEIL OSTEOTOMY ;  Surgeon: [name], MD;  Location: [place];  Service: Orthopedics;  Laterality: Left;   Patient Active Problem List   Diagnosis Date Noted   S/P lumbar spinal fusion 01/01/2017   ADD (attention deficit disorder) 07/17/2015   Bipolar affective disorder ([place]) 07/17/2015   HLD (hyperlipidemia) 07/17/2015   BP (high blood pressure) 07/17/2015   Obstructive apnea 07/17/2015   Staphylococcus aureus infection    Foot osteomyelitis, right ([place]) 07/04/2015    PCP: Velna Hatchet, MD  REFERRING PROVIDER: Ellard Artis  Georgina Snell  REFERRING DIAG: R hip pain  THERAPY DIAG:  Pain in right hip  Acute right-sided low back pain without sciatica  Pain in left ankle and joints of left foot  ONSET DATE:  1 month ago.   SUBJECTIVE:                                                                                                                                                                                           SUBJECTIVE STATEMENT: Pt states less less pain at night and when rolling over, less pain overall. Wearing new shoes with good comfort. Toes doing much better, no pain in L or R.    PERTINENT HISTORY:  L TKA,  Lumbar fuston L4/5 ,   PAIN:  Are you having pain?  Yes NPRS scale: 0-3/10 Pain location: Hip and low back  Pain orientation: Right  PAIN TYPE: aching Pain description: intermittent  Aggravating factors: sleeping, standing, walking, lifitng leg Relieving factors: rest     PRECAUTIONS: None  WEIGHT BEARING RESTRICTIONS No  FALLS:  Has patient fallen in last 6 months? No, Number of falls: 0  OCCUPATION: Retired,   PLOF: Independent  PATIENT GOALS  Return to regular exercise, walking.    OBJECTIVE:   DIAGNOSTIC FINDINGS: none for hip or back.   COGNITION:  Overall cognitive status: Within functional limits for tasks assessed  POSTURE:  Standing: R hip lower, 2nd toe with some clawing/extension, wide forefoot;   Supine: R LE shorter,   PALPATION: Eval: L foot: soreness with light touch and palpation of top of 5th toe R foot: pain with palpation of plantar  2-3 region, 2nd toe with some clawing/extension- possible plantar plate pathology, also has palpable area, likely hardware on dorsal surface-may require further assessment from foot MD.  R hip: tenderness in posterior gr troch, into glute, pain in R SI   ROM Eval AROM-   Lumbar: WFL,  Hips: mild limitation for R flexion/ rotation,  L knee: lacking full flexion (from previous TKA) ; Limited DF bil;   MMT: Eval Hips: 4/5 (pain with testing on R),  Knees: 4+5,    GAIT:Eval Decreased hip ext on R with decreased terminal stance position and DF; mild increase in hip /lumbar rotation on R with terminal stance;     TODAY'S TREATMENT  07/23/21: Therapeutic Exercise: Aerobic: Recumbent bike L2 x 6 min;  Supine: Bridging x 20; SLR x 10 bil with TA; Clams blue TB x20 with TA;  S/L:  Seated: Standing:  Squat walks Blue TB 20 ft x 4; Squats x 20; Hip abd x10 bil; 2x10 withYTB; Fwd and lat step  ups 6 in x 10 ea bil;  Stretches: Manual: Long leg distraction on R for lumbar; R hip inf and post mobs; Manual piriformis stretch;  07/16/21: Therapeutic Exercise: Aerobic:  Recumbent bike L1 x 5 min;  Supine: Bridging x 20;  S/L:  hip abd x20 bil; clams x 20 bil;  Seated: Standing:  Heel raises x 20 with education on form;  Hip ext and abd 2x10 ea bil;  Stretches:  Gastroc at wall x 20, x 10  Neuromuscular Re-education: Manual Therapy: Therapeutic Activity: Self Care:    PATIENT EDUCATION:  Education details: Updated and reviewed HEP, discussed footwear and orthotics- see self care.  Person educated: Patient Education method: Explanation, Demonstration, Tactile cues, Verbal cues, and Handouts Education comprehension: verbalized understanding, returned demonstration, verbal cues required, tactile cues required, and needs further education   HOME EXERCISE PROGRAM: Access Code: [email or web address] Updated 07/23/21   ASSESSMENT:  CLINICAL IMPRESSION: 07/23/21: Pt with improving pain in hip this week. Foot pain in L and R also much improved, and is wearing heel lift and new shoes with good fit and comfort. Pt to benefit from continued strengthening for hips and core, and to decrease pain in R SI.    OBJECTIVE IMPAIRMENTS Abnormal gait, decreased activity tolerance, decreased balance, decreased knowledge of use of DME, decreased mobility, difficulty walking, decreased ROM, decreased strength, increased muscle spasms, impaired flexibility, improper body mechanics, and pain.   ACTIVITY LIMITATIONS cleaning, community activity, meal prep, laundry, and yard work.   PERSONAL FACTORS Past/current experiences /foot surgeries and deficits are also affecting patient's functional outcome.    REHAB POTENTIAL: Good  CLINICAL DECISION MAKING: Stable/uncomplicated  EVALUATION COMPLEXITY: Low   GOALS: Goals reviewed with patient? Yes  SHORT TERM GOALS:  STG Name Target Date Goal status  1 Patient will be independent with initial HEP   07/23/21 INITIAL              LONG TERM GOALS:   LTG Name Target Date Goal status  1 Patient will be independent with final HEP   08/20/21 INITIAL  2 Pt to report decreased pain in R hip to be to 0-2/10 with standing activity   08/20/21 INITIAL  3 Pt to demonstrate improved strength of R hip to be at least 4+/5, to improve ability for gait and stairs   08/20/21 INITIAL  4 Pt to demonstrate optimal mechanics with hips and feet during gait cycle, with foot and hip stability WNL   08/20/21 INITIAL  5 Pt to be compliant with optimal footwear for foot type and diagnosis.  08/20/21 INITIAL     PLAN: PT FREQUENCY: 1-2x/week  PT DURATION: 6 weeks  PLANNED INTERVENTIONS: Therapeutic exercises, Therapeutic activity, Neuro Muscular re-education, Balance training, Gait training, Patient/Family education, Joint mobilization, Stair training, DME instructions, Dry Needling, Electrical stimulation, Spinal mobilization, Moist heat, Taping, Traction, Ultrasound, Ionotophoresis 4mg /ml Dexamethasone, and Manual therapy  PLAN FOR NEXT SESSION:   Lyndee Hensen, PT, DPT 1:10 PM  07/23/21

## 2021-07-25 ENCOUNTER — Encounter: Payer: PPO | Admitting: Physical Therapy

## 2021-07-29 ENCOUNTER — Encounter: Payer: PPO | Admitting: Physical Therapy

## 2021-07-30 ENCOUNTER — Ambulatory Visit: Payer: PPO | Admitting: Physical Therapy

## 2021-07-30 DIAGNOSIS — M25551 Pain in right hip: Secondary | ICD-10-CM | POA: Diagnosis not present

## 2021-07-30 DIAGNOSIS — M545 Low back pain, unspecified: Secondary | ICD-10-CM

## 2021-07-30 DIAGNOSIS — M25572 Pain in left ankle and joints of left foot: Secondary | ICD-10-CM | POA: Diagnosis not present

## 2021-07-30 NOTE — Therapy (Signed)
OUTPATIENT PHYSICAL THERAPY TREATMENT   Patient Name: Whitney Lewis MRN: [id number] DOB:1961-04-29, 61 y.o., female Today's Date: 07/30/2021    PT End of Session - 07/31/21 1252     Visit Number 5    Number of Visits 12    Date for PT Re-Evaluation 08/20/21    Authorization Type HTA    PT Start Time 0845    PT Stop Time 0932    PT Time Calculation (min) 47 min    Activity Tolerance Patient tolerated treatment well    Behavior During Therapy [facility] for tasks assessed/performed                 Past Medical History:  Diagnosis Date   ADD (attention deficit disorder)    Anxiety    Arthritis    feet, knees (07/04/2015)   Bipolar disorder ([place])    Complication of anesthesia    hard to wake up, blood pressure drops    Depression    Family history of adverse reaction to anesthesia    "sister hard to wake up also"   Fibromyalgia dx'd ~ 2000   GERD (gastroesophageal reflux disease)    Hyperlipidemia    Hypertension    Migraine    "hormonal; stopped when I was 16" (07/04/2015)   OSA (obstructive sleep apnea)    wears mouth piece, no CPAP   Seizures (HCC)    Past Surgical History:  Procedure Laterality Date   ABDOMINAL HYSTERECTOMY  ~ [place] WITH PROPOFOL N/A 09/21/2017   Procedure: COLONOSCOPY WITH PROPOFOL;  Surgeon: Lollie Sails, MD;  Location: [facility] ENDOSCOPY;  Service: Endoscopy;  Laterality: N/A;   HAMMER TOE SURGERY Right 03/2015   HAMMER TOE SURGERY Left 06/06/2015   Procedure: LEFT TWO-THREE  HAMMER TOE CORRECTION ;  Surgeon: [name], MD;  Location: [place];  Service: Orthopedics;  Laterality: Left;   INCISION AND DRAINAGE OF WOUND Right 07/04/2015   Procedure: IRRIGATION AND DEBRIDEMENT OF RIGHT FOOT DEEP ABSCESS; EXCISION OF RIGHT SECOND METATARSAL HEAD; REMOVAL OF DEEP IMPLANT RIGHT FOOT; EXCISION OF RIGHT SECOND TOE PROXIMAL PHALANX BASE; INSERTION OF ANTIBIOTIC  BEADS INTO RIGHT FOOT ABSCESS;  Surgeon: [name], MD;  Location: [place];  Service: Orthopedics;  Laterality: Right;  SITE ALSO  RIGHT FOOT   IRRIGATION AND DEBRIDEMENT FOOT Right 07/04/2015   Irrigation and excisional debridement of right foot deep abscess;; Insertion of antibiotic beads into the right foot abscess   JOINT REPLACEMENT     MAXIMUM ACCESS (MAS)POSTERIOR LUMBAR INTERBODY FUSION (PLIF) 1 LEVEL N/A 01/01/2017   Procedure: L4-5 MAXIMUM ACCESS (MAS) POSTERIOR LUMBAR INTERBODY FUSION (PLIF);  Surgeon: Eustace Moore, MD;  Location: [place];  Service: Neurosurgery;  Laterality: N/A;   TOTAL KNEE ARTHROPLASTY Left    WEIL OSTEOTOMY Left 06/06/2015   Procedure: LEFT TWO AND THREE METATARSAL WEIL OSTEOTOMY ;  Surgeon: [name], MD;  Location: [place];  Service: Orthopedics;  Laterality: Left;   Patient Active Problem List   Diagnosis Date Noted   S/P lumbar spinal fusion 01/01/2017   ADD (attention deficit disorder) 07/17/2015   Bipolar affective disorder ([place]) 07/17/2015   HLD (hyperlipidemia) 07/17/2015   BP (high blood pressure) 07/17/2015   Obstructive apnea 07/17/2015   Staphylococcus aureus infection    Foot osteomyelitis, right ([place]) 07/04/2015    PCP: Velna Hatchet, MD  REFERRING PROVIDER:  Lynne Leader  REFERRING DIAG: R hip pain  THERAPY DIAG:  Pain in right hip  Acute right-sided low back pain without sciatica  Pain in left ankle and joints of left foot  ONSET DATE:  1 month ago.   SUBJECTIVE:                                                                                                                                                                                           SUBJECTIVE STATEMENT: Pt states no pain this week. Wearing (another pair of) new shoes - [employer]- with good comfort. Toes doing much better, no pain in L or R.    PERTINENT HISTORY:  L TKA,  Lumbar fuston L4/5 ,   PAIN:  Are you having pain? Yes NPRS scale:  0-3/10 Pain location: Hip and low back  Pain orientation: Right  PAIN TYPE: aching Pain description: intermittent  Aggravating factors: sleeping, standing, walking, lifitng leg Relieving factors: rest     PRECAUTIONS: None  WEIGHT BEARING RESTRICTIONS No  FALLS:  Has patient fallen in last 6 months? No, Number of falls: 0  OCCUPATION: Retired,   PLOF: Independent  PATIENT GOALS  Return to regular exercise, walking.    OBJECTIVE:   DIAGNOSTIC FINDINGS: none for hip or back.   COGNITION:  Overall cognitive status: Within functional limits for tasks assessed  POSTURE:  Standing: R hip lower, 2nd toe with some clawing/extension, wide forefoot;   Supine: R LE shorter,   PALPATION: Eval: L foot: soreness with light touch and palpation of top of 5th toe R foot: pain with palpation of plantar  2-3 region, 2nd toe with some clawing/extension- possible plantar plate pathology, also has palpable area, likely hardware on dorsal surface-may require further assessment from foot MD.  R hip: tenderness in posterior gr troch, into glute, pain in R SI   ROM Eval AROM-   Lumbar: WFL,  Hips: mild limitation for R flexion/ rotation,  L knee: lacking full flexion (from previous TKA) ; Limited DF bil;   MMT: Eval Hips: 4/5 (pain with testing on R),  Knees: 4+5,    GAIT:Eval Decreased hip ext on R with decreased terminal stance position and DF; mild increase in hip /lumbar rotation on R with terminal stance;     TODAY'S TREATMENT   07/30/21: Aerobic: Recumbent bike L2 x 6 min;  Supine: Bridging x 20; SLR x 10 bil with TA;  S/L:  Seated: Standing:   Squats x 20; Hip abd  2x10 withYTB; Fwd step ups 6 in x 10 ea bil no UE support ;  SLS with head turns x 10 bil;  SLS with runner lunge (small range) x 10 bil;  Stretches: Manual:  Self care:  discussed tmj dysfunction, rocabados, self massage, also discussed footwear, heel lift, and orthotic use.      07/23/21: Therapeutic  Exercise: Aerobic: Recumbent bike L2 x 6 min;  Supine: Bridging x 20; SLR x 10 bil with TA; Clams blue TB x20 with TA;  S/L:  Seated: Standing:  Squat walks Blue TB 20 ft x 4; Squats x 20; Hip abd x10 bil; 2x10 withYTB; Fwd and lat step ups 6 in x 10 ea bil;  Stretches: Manual: Long leg distraction on R for lumbar; R hip inf and post mobs; Manual piriformis stretch;    PATIENT EDUCATION:  Education details: Updated and reviewed HEP, discussed footwear and orthotics- see self care.  Person educated: Patient Education method: Explanation, Demonstration, Tactile cues, Verbal cues, and Handouts Education comprehension: verbalized understanding, returned demonstration, verbal cues required, tactile cues required, and needs further education   HOME EXERCISE PROGRAM: Access Code: [email or web address] Updated 07/23/21   ASSESSMENT:  CLINICAL IMPRESSION: 07/30/21: Screen for L jaw pain: pt fell onto L side of jaw, did have prior jaw issues from sleeping apparatus but no pain. She has some tenderness with lateral deviation as well as palpation of masseter. Discussed allowing a bit more time for healing, since fall, massage to L musculature, and 2 rocabado exercises discussed today, may require referral for further TMJ assessment/treatment.  Pt with much improving hip pain. She is doing well with strengthening. More challenged with higher level activity and SL balance done today, pt to benefit from continued strengthening and stabilization, likley d/c in next 2 weeks if still pain free.    OBJECTIVE IMPAIRMENTS Abnormal gait, decreased activity tolerance, decreased balance, decreased knowledge of use of DME, decreased mobility, difficulty walking, decreased ROM, decreased strength, increased muscle spasms, impaired flexibility, improper body mechanics, and pain.   ACTIVITY LIMITATIONS cleaning, community activity, meal prep, laundry, and yard work.   PERSONAL FACTORS Past/current experiences /foot surgeries  and deficits are also affecting patient's functional outcome.    REHAB POTENTIAL: Good  CLINICAL DECISION MAKING: Stable/uncomplicated  EVALUATION COMPLEXITY: Low   GOALS: Goals reviewed with patient? Yes  SHORT TERM GOALS:  STG Name Target Date Goal status  1 Patient will be independent with initial HEP   07/23/21 INITIAL              LONG TERM GOALS:   LTG Name Target Date Goal status  1 Patient will be independent with final HEP  08/20/21 INITIAL  2 Pt to report decreased pain in R hip to be to 0-2/10 with standing activity   08/20/21 INITIAL  3 Pt to demonstrate improved strength of R hip to be at least 4+/5, to improve ability for gait and stairs   08/20/21 INITIAL  4 Pt to demonstrate optimal mechanics with hips and feet during gait cycle, with foot and hip stability WNL   08/20/21 INITIAL  5 Pt to be compliant with optimal footwear for foot type and diagnosis.  08/20/21 INITIAL     PLAN: PT FREQUENCY: 1-2x/week  PT DURATION: 6 weeks  PLANNED INTERVENTIONS: Therapeutic exercises, Therapeutic activity, Neuro Muscular re-education, Balance training, Gait training, Patient/Family education, Joint mobilization, Stair training, DME instructions, Dry Needling, Electrical stimulation, Spinal mobilization, Moist heat, Taping, Traction, Ultrasound, Ionotophoresis '4mg'$ /ml Dexamethasone, and Manual therapy  PLAN FOR NEXT SESSION:   Lyndee Hensen, PT, DPT 12:53 PM  07/31/21

## 2021-07-31 ENCOUNTER — Encounter: Payer: Self-pay | Admitting: Physical Therapy

## 2021-08-04 ENCOUNTER — Encounter: Payer: Self-pay | Admitting: Physical Therapy

## 2021-08-04 ENCOUNTER — Ambulatory Visit: Payer: PPO | Admitting: Physical Therapy

## 2021-08-04 DIAGNOSIS — M25572 Pain in left ankle and joints of left foot: Secondary | ICD-10-CM

## 2021-08-04 DIAGNOSIS — M25551 Pain in right hip: Secondary | ICD-10-CM

## 2021-08-04 DIAGNOSIS — M545 Low back pain, unspecified: Secondary | ICD-10-CM

## 2021-08-04 NOTE — Therapy (Signed)
OUTPATIENT PHYSICAL THERAPY TREATMENT   Patient Name: Whitney Lewis MRN: [id number] DOB:[birth year], 61 y.o., female Today's Date: 07/30/2021    PT End of Session - 08/04/21 1511     Visit Number 6    Number of Visits 12    Date for PT Re-Evaluation 08/20/21    Authorization Type HTA    PT Start Time 1512    PT Stop Time 1551    PT Time Calculation (min) 39 min    Activity Tolerance Patient tolerated treatment well    Behavior During Therapy WFL for tasks assessed/performed                 Past Medical History:  Diagnosis Date   ADD (attention deficit disorder)    Anxiety    Arthritis    feet, knees (07/04/2015)   Bipolar disorder ([place])    Complication of anesthesia    hard to wake up, blood pressure drops    Depression    Family history of adverse reaction to anesthesia    "sister hard to wake up also"   Fibromyalgia dx'd ~ 2000   GERD (gastroesophageal reflux disease)    Hyperlipidemia    Hypertension    Migraine    "hormonal; stopped when I was 16" (07/04/2015)   OSA (obstructive sleep apnea)    wears mouth piece, no CPAP   Seizures ([facility])    Past Surgical History:  Procedure Laterality Date   ABDOMINAL HYSTERECTOMY  ~ [place] WITH PROPOFOL N/A 09/21/2017   Procedure: COLONOSCOPY WITH PROPOFOL;  Surgeon: Lollie Sails, MD;  Location[facility] ENDOSCOPY;  Service: Endoscopy;  Laterality: N/A;   HAMMER TOE SURGERY Right 03/2015   HAMMER TOE SURGERY Left 06/06/2015   Procedure: LEFT TWO-THREE  HAMMER TOE CORRECTION ;  Surgeon: [name], MD;  Location: [place];  Service: Orthopedics;  Laterality: Left;   INCISION AND DRAINAGE OF WOUND Right 07/04/2015   Procedure: IRRIGATION AND DEBRIDEMENT OF RIGHT FOOT DEEP ABSCESS; EXCISION OF RIGHT SECOND METATARSAL HEAD; REMOVAL OF DEEP IMPLANT RIGHT FOOT; EXCISION OF RIGHT SECOND TOE PROXIMAL PHALANX BASE; INSERTION OF ANTIBIOTIC  BEADS INTO RIGHT FOOT ABSCESS;  Surgeon: [name], MD;  Location: [place];  Service: Orthopedics;  Laterality: Right;  SITE ALSO  RIGHT FOOT   IRRIGATION AND DEBRIDEMENT FOOT Right 07/04/2015   Irrigation and excisional debridement of right foot deep abscess;; Insertion of antibiotic beads into the right foot abscess   JOINT REPLACEMENT     MAXIMUM ACCESS (MAS)POSTERIOR LUMBAR INTERBODY FUSION (PLIF) 1 LEVEL N/A 01/01/2017   Procedure: L4-5 MAXIMUM ACCESS (MAS) POSTERIOR LUMBAR INTERBODY FUSION (PLIF);  Surgeon: Eustace Moore, MD;  Location: [place];  Service: Neurosurgery;  Laterality: N/A;   TOTAL KNEE ARTHROPLASTY Left    WEIL OSTEOTOMY Left 06/06/2015   Procedure: LEFT TWO AND THREE METATARSAL WEIL OSTEOTOMY ;  Surgeon: [name], MD;  Location: [place];  Service: Orthopedics;  Laterality: Left;   Patient Active Problem List   Diagnosis Date Noted   S/P lumbar spinal fusion 01/01/2017   ADD (attention deficit disorder) 07/17/2015   Bipolar affective disorder ([place]) 07/17/2015   HLD (hyperlipidemia) 07/17/2015   BP (high blood pressure) 07/17/2015   Obstructive apnea 07/17/2015   Staphylococcus aureus infection    Foot osteomyelitis, right ([place]) 07/04/2015    PCP: Velna Hatchet, MD  REFERRING PROVIDER:  Lynne Leader  REFERRING DIAG: R hip pain  THERAPY DIAG:  Pain in right hip  Acute right-sided low back pain without sciatica  Pain in left ankle and joints of left foot  ONSET DATE:  1 month ago.   SUBJECTIVE:                                                                                                                                                                                           SUBJECTIVE STATEMENT: Pt states no pain in hip this week. Did walk some, up to 1 mile, no pain. Has been doing HEP. Jaw pain somewhat improved.   PERTINENT HISTORY:  L TKA,  Lumbar fuston L4/5 ,   PAIN:  Are you having pain? Yes NPRS scale: 0-3/10 Pain location:  Hip and low back  Pain orientation: Right  PAIN TYPE: aching Pain description: intermittent  Aggravating factors: sleeping, standing, walking, lifitng leg Relieving factors: rest     PRECAUTIONS: None  WEIGHT BEARING RESTRICTIONS No  FALLS:  Has patient fallen in last 6 months? No, Number of falls: 0  OCCUPATION: Retired,   PLOF: Independent  PATIENT GOALS  Return to regular exercise, walking.    OBJECTIVE:   DIAGNOSTIC FINDINGS: none for hip or back.   COGNITION:  Overall cognitive status: Within functional limits for tasks assessed  POSTURE:  Standing: R hip lower, 2nd toe with some clawing/extension, wide forefoot;   Supine: R LE shorter,   PALPATION: Eval: L foot: soreness with light touch and palpation of top of 5th toe R foot: pain with palpation of plantar  2-3 region, 2nd toe with some clawing/extension- possible plantar plate pathology, also has palpable area, likely hardware on dorsal surface-may require further assessment from foot MD.  R hip: tenderness in posterior gr troch, into glute, pain in R SI   ROM Eval AROM-   Lumbar: WFL,  Hips: mild limitation for R flexion/ rotation,  L knee: lacking full flexion (from previous TKA) ; Limited DF bil;   MMT: Eval Hips: 4/5 (pain with testing on R),  Knees: 4+5,    GAIT:Eval Decreased hip ext on R with decreased terminal stance position and DF; mild increase in hip /lumbar rotation on R with terminal stance;     TODAY'S TREATMENT   08/04/21: Aerobic: Recumbent bike L2 x 7 min;  Supine: Bridging x 20;  SLR x 10 bil with TA;  S/L: Hip abd 2 x10, clam x 15;  Seated: Standing:  Squats x 20; Hip abd  3x10 withYTB; Fwd step ups 6 in x 15 ea bil no UE support ;  SLS with runner  lunge (small range) x 10 bil;  Stretches:  piriformis hip ER and IR 30 sec x 3 bil;  Manual:     07/30/21: Aerobic: Recumbent bike L2 x 6 min;  Supine: Bridging x 20; SLR x 10 bil with TA;  S/L:  Seated: Standing:   Squats x 20;  Hip abd  2x10 withYTB; Fwd step ups 6 in x 10 ea bil no UE support ;  SLS with head turns x 10 bil; SLS with runner lunge (small range) x 10 bil;  Stretches: Manual:  Self care:  discussed tmj dysfunction, rocabados, self massage, also discussed footwear, heel lift, and orthotic use.     PATIENT EDUCATION:  Education details: Updated and reviewed HEP, discussed footwear and orthotics- see self care.  Person educated: Patient Education method: Explanation, Demonstration, Tactile cues, Verbal cues, and Handouts Education comprehension: verbalized understanding, returned demonstration, verbal cues required, tactile cues required, and needs further education   HOME EXERCISE PROGRAM: Access Code: [email or web address] Updated 07/23/21   ASSESSMENT:  CLINICAL IMPRESSION: 08/04/21:   Pt with much improving hip pain. Has been able to increase activity level and walking this week without increased pain. Doing very well with ther ex. Likley d/c in next 2 weeks if still pain free. Pt to benefit from 1-2 more visits for final HEP.    OBJECTIVE IMPAIRMENTS Abnormal gait, decreased activity tolerance, decreased balance, decreased knowledge of use of DME, decreased mobility, difficulty walking, decreased ROM, decreased strength, increased muscle spasms, impaired flexibility, improper body mechanics, and pain.   ACTIVITY LIMITATIONS cleaning, community activity, meal prep, laundry, and yard work.   PERSONAL FACTORS Past/current experiences /foot surgeries and deficits are also affecting patient's functional outcome.    REHAB POTENTIAL: Good  CLINICAL DECISION MAKING: Stable/uncomplicated  EVALUATION COMPLEXITY: Low   GOALS: Goals reviewed with patient? Yes  SHORT TERM GOALS:  STG Name Target Date Goal status  1 Patient will be independent with initial HEP   07/23/21 INITIAL              LONG TERM GOALS:   LTG Name Target Date Goal status  1 Patient will be independent with final HEP  08/20/21  INITIAL  2 Pt to report decreased pain in R hip to be to 0-2/10 with standing activity   08/20/21 INITIAL  3 Pt to demonstrate improved strength of R hip to be at least 4+/5, to improve ability for gait and stairs   08/20/21 INITIAL  4 Pt to demonstrate optimal mechanics with hips and feet during gait cycle, with foot and hip stability WNL   08/20/21 INITIAL  5 Pt to be compliant with optimal footwear for foot type and diagnosis.  08/20/21 INITIAL     PLAN: PT FREQUENCY: 1-2x/week  PT DURATION: 6 weeks  PLANNED INTERVENTIONS: Therapeutic exercises, Therapeutic activity, Neuro Muscular re-education, Balance training, Gait training, Patient/Family education, Joint mobilization, Stair training, DME instructions, Dry Needling, Electrical stimulation, Spinal mobilization, Moist heat, Taping, Traction, Ultrasound, Ionotophoresis '4mg'$ /ml Dexamethasone, and Manual therapy  PLAN FOR NEXT SESSION:   Lyndee Hensen, PT, DPT 3:53 PM  08/04/21

## 2021-08-11 ENCOUNTER — Encounter: Payer: PPO | Admitting: Physical Therapy

## 2021-08-18 ENCOUNTER — Encounter: Payer: PPO | Admitting: Physical Therapy

## 2021-08-19 ENCOUNTER — Encounter: Payer: Self-pay | Admitting: Physical Therapy

## 2021-08-19 ENCOUNTER — Ambulatory Visit: Payer: PPO | Admitting: Physical Therapy

## 2021-08-19 DIAGNOSIS — M25551 Pain in right hip: Secondary | ICD-10-CM

## 2021-08-19 DIAGNOSIS — M25572 Pain in left ankle and joints of left foot: Secondary | ICD-10-CM | POA: Diagnosis not present

## 2021-08-19 DIAGNOSIS — M545 Low back pain, unspecified: Secondary | ICD-10-CM

## 2021-08-19 NOTE — Therapy (Signed)
?OUTPATIENT PHYSICAL THERAPY TREATMENT/Re-Cert ? ? ?Patient Name: Whitney Lewis ?MRN: [id number] ?DOB:Feb 19, 1961, 61 y.o., female ?Today's Date: 08/19/2021 ? ? ? ? PT End of Session - 08/19/21 1222   ? ? Visit Number 7   ? Number of Visits 12   ? Date for PT Re-Evaluation 09/16/21   ? Authorization Type HTA   re-cert done vist 7   ? PT Start Time 1020   ? PT Stop Time 1055   ? PT Time Calculation (min) 35 min   ? Activity Tolerance Patient tolerated treatment well   ? Behavior During Therapy [facility] for tasks assessed/performed   ? ?  ?  ? ?  ? ? ? ? ? ? ? ?Past Medical History:  ?Diagnosis Date  ? ADD (attention deficit disorder)   ? Anxiety   ? Arthritis   ? feet, knees (07/04/2015)  ? Bipolar disorder ([place])   ? Complication of anesthesia   ? hard to wake up, blood pressure drops   ? Depression   ? Family history of adverse reaction to anesthesia   ? "sister hard to wake up also"  ? Fibromyalgia dx'd ~ 2000  ? GERD (gastroesophageal reflux disease)   ? Hyperlipidemia   ? Hypertension   ? Migraine   ? "hormonal; stopped when I was 16" (07/04/2015)  ? OSA (obstructive sleep apnea)   ? wears mouth piece, no CPAP  ? Seizures ([place])   ? ?Past Surgical History:  ?Procedure Laterality Date  ? ABDOMINAL HYSTERECTOMY  ~ 1993  ? BACK SURGERY    ? [place]  ? COLONOSCOPY    ? COLONOSCOPY WITH PROPOFOL N/A 09/21/2017  ? Procedure: COLONOSCOPY WITH PROPOFOL;  Surgeon: Lollie Sails, MD;  Location: [facility] ENDOSCOPY;  Service: Endoscopy;  Laterality: N/A;  ? HAMMER TOE SURGERY Right 03/2015  ? HAMMER TOE SURGERY Left 06/06/2015  ? Procedure: LEFT TWO-THREE  HAMMER TOE CORRECTION ;  Surgeon: [name], MD;  Location: [place];  Service: Orthopedics;  Laterality: Left;  ? INCISION AND DRAINAGE OF WOUND Right 07/04/2015  ? Procedure: IRRIGATION AND DEBRIDEMENT OF RIGHT FOOT DEEP ABSCESS; EXCISION OF RIGHT SECOND METATARSAL HEAD; REMOVAL OF DEEP IMPLANT RIGHT FOOT; EXCISION OF RIGHT SECOND TOE PROXIMAL  PHALANX BASE; INSERTION OF ANTIBIOTIC BEADS INTO RIGHT FOOT ABSCESS;  Surgeon: [name], MD;  Location: [place];  Service: Orthopedics;  Laterality: Right;  SITE ALSO  RIGHT FOOT  ? IRRIGATION AND DEBRIDEMENT FOOT Right 07/04/2015  ? Irrigation and excisional debridement of right foot deep abscess;; Insertion of antibiotic beads into the right foot abscess  ? JOINT REPLACEMENT    ? MAXIMUM ACCESS (MAS)POSTERIOR LUMBAR INTERBODY FUSION (PLIF) 1 LEVEL N/A 01/01/2017  ? Procedure: L4-5 MAXIMUM ACCESS (MAS) POSTERIOR LUMBAR INTERBODY FUSION (PLIF);  Surgeon: Eustace Moore, MD;  Location: [place];  Service: Neurosurgery;  Laterality: N/A;  ? TOTAL KNEE ARTHROPLASTY Left   ? WEIL OSTEOTOMY Left 06/06/2015  ? Procedure: LEFT TWO AND THREE METATARSAL WEIL OSTEOTOMY ;  Surgeon: [name], MD;  Location: [place];  Service: Orthopedics;  Laterality: Left;  ? ?Patient Active Problem List  ? Diagnosis Date Noted  ? S/P lumbar spinal fusion 01/01/2017  ? ADD (attention deficit disorder) 07/17/2015  ? Bipolar affective disorder ([place]) 07/17/2015  ? HLD (hyperlipidemia) 07/17/2015  ? BP (high blood pressure) 07/17/2015  ? Obstructive apnea 07/17/2015  ? Staphylococcus aureus infection   ? Foot osteomyelitis, right ([place]) 07/04/2015  ? ? ?  PCP: Velna Hatchet, MD ? ?REFERRING PROVIDER: Lynne Leader ? ?REFERRING DIAG: R hip pain ? ?THERAPY DIAG:  ?Pain in right hip ? ?Acute right-sided low back pain without sciatica ? ?Pain in left ankle and joints of left foot ? ?ONSET DATE:  1 month ago.  ? ?SUBJECTIVE:                                                                                                                                                                                          ? ?SUBJECTIVE STATEMENT: ?Pt has been seen for 7 visits. She was doing very well in last couple weeks, with no pain in hip. Recently this week, she has noted increase in pain in bil glutes. Reports soreness when laying down and in the  night, is able to change positions for some relief. She has been able to increase her walking distance, states no increase in pain with this.  ? ? ?PERTINENT HISTORY:  ?L TKA,  Lumbar fuston L4/5 ,  ? ?PAIN:  ?Are you having pain? Yes ?NPRS scale: 0-3/10 ?Pain location: bil glutes  ?Pain orientation: Right  ?PAIN TYPE: aching ?Pain description: intermittent  ?Aggravating factors: sleeping,unable to state ?Relieving factors: stretching. ? ? ? ?PRECAUTIONS: None ? ?WEIGHT BEARING RESTRICTIONS No ? ?FALLS:  ?Has patient fallen in last 6 months? No, Number of falls: 0 ? ?OCCUPATION: Retired,  ? ?PLOF: Independent ? ?PATIENT GOALS  Return to regular exercise, walking.  ? ? ?OBJECTIVE:  ?Updated 08/19/21 ?DIAGNOSTIC FINDINGS: none for hip or back.  ? ?COGNITION: ? Overall cognitive status: Within functional limits for tasks assessed  ?POSTURE:  ?Standing: R hip lower, 2nd toe with some clawing/extension, wide forefoot;   Supine: R LE shorter,  ? ?PALPATION: ?Soreness in bil glute min , mild into piriformis ? ?ROM Eval ?AROM-   Lumbar: WFL,  Hips: mild limitation for R flexion/ rotation,  L knee: lacking full flexion (from previous TKA) ; Limited DF bil;  ? ?MMT:  ?Hips: 4+/5 ;  Knees: 5/5,   ? ?GAIT: ? ? ?TODAY'S TREATMENT  ?08/19/21: ?Aerobic:  ?Supine: Bridging x 20;  SLR x 10 bil with TA;  ?S/L: Hip abd 2 x10, clam x 10 bil;  ?Seated: ?Standing:  Hip abd  2 x10 ;  ?Stretches:  piriformis hip ER and IR 30 sec x 4 bil; reviewed stretches/mobility for back;   ?Manual: DTM/TPR to bil glute min, med, piriformis, bil; long leg distraction bil for hips;  ? ? ?PATIENT EDUCATION:  ?Education details: Updated and reviewed HEP,  ?Person educated: Patient ?Education method: Explanation, Demonstration, Tactile cues, Verbal cues, and Handouts ?Education comprehension: verbalized  understanding, returned demonstration, verbal cues required, tactile cues required, and needs further education ? ? ?HOME EXERCISE PROGRAM: ?Access Code:  [email or web address] ?Updated 08/19/21 ? ? ?ASSESSMENT: ? ?CLINICAL IMPRESSION: ?08/19/21: Re-Cert:  ?Pt was doing very well in past weeks, with much improved pain. She still has less/no pain in R hip or in feet, and is doing very well with management of this. She has developed more soreness in bil glutes in the last week or so. She does have tenderness with palpation and manual release today. Reviewed best stretches to target this area, and discussed not over working/strengthening if she is getting more pain in this region. Will continue with HEP this week, and see if she has less pain with addition of more hip mobility and stretching. Pt to benefit from continued care, for follow up on new pain in glutes. She is in agreement with plan.   ? ? ?OBJECTIVE IMPAIRMENTS Abnormal gait, decreased activity tolerance, decreased balance, decreased knowledge of use of DME, decreased mobility, difficulty walking, decreased ROM, decreased strength, increased muscle spasms, impaired flexibility, improper body mechanics, and pain.  ? ?ACTIVITY LIMITATIONS cleaning, community activity, meal prep, laundry, and yard work.  ? ?PERSONAL FACTORS Past/current experiences /foot surgeries and deficits are also affecting patient's functional outcome.  ? ? ?REHAB POTENTIAL: Good ? ?CLINICAL DECISION MAKING: Stable/uncomplicated ? ?EVALUATION COMPLEXITY: Low ? ? ?GOALS: ?Goals reviewed with patient? Yes ? ?SHORT TERM GOALS: ? ?STG Name Target Date Goal status  ?1 Patient will be independent with initial HEP  ? 07/23/21 MET  ?     ?  ?    ? ?LONG TERM GOALS:  ? ?LTG Name Target Date Goal status  ?1 Patient will be independent with final HEP ? 08/20/21 MET  ?2 Pt to report decreased pain in R hip to be to 0-2/10 with standing activity  ? 09/16/21 Partially met  ?3 Pt to demonstrate improved strength of R hip to be at least 4+/5, to improve ability for gait and stairs  ? 09/16/21 MET  ?4 Pt to demonstrate optimal mechanics with hips and feet during gait cycle,  with foot and hip stability WNL  ? 09/16/21 MET  ?5 Pt to be compliant with optimal footwear for foot type and diagnosis. ? 08/20/21 MET  ? ? ? ?PLAN: ?PT FREQUENCY: 1-2x/week ? ?PT DURATION: 4 weeks ? ?PLANNED INTER

## 2021-09-04 ENCOUNTER — Telehealth: Payer: Self-pay | Admitting: Internal Medicine

## 2021-09-04 NOTE — Telephone Encounter (Signed)
Called pt. She would like a call back from lauren to discuss continuing PT with her foot fx.  ?

## 2021-09-04 NOTE — Telephone Encounter (Signed)
PT states she broke her foot and is unsure how to move forward. Would like to work with Lyndee Hensen for PT. ? ?Please call. ?

## 2021-09-10 ENCOUNTER — Encounter: Payer: Self-pay | Admitting: Family Medicine

## 2021-09-11 ENCOUNTER — Ambulatory Visit: Payer: PPO | Admitting: Family Medicine

## 2021-09-11 ENCOUNTER — Ambulatory Visit (INDEPENDENT_AMBULATORY_CARE_PROVIDER_SITE_OTHER): Payer: PPO

## 2021-09-11 ENCOUNTER — Encounter: Payer: Self-pay | Admitting: Family Medicine

## 2021-09-11 VITALS — BP 120/70 | HR 85 | Ht 66.0 in | Wt 146.4 lb

## 2021-09-11 DIAGNOSIS — S92309A Fracture of unspecified metatarsal bone(s), unspecified foot, initial encounter for closed fracture: Secondary | ICD-10-CM | POA: Insufficient documentation

## 2021-09-11 DIAGNOSIS — S92355A Nondisplaced fracture of fifth metatarsal bone, left foot, initial encounter for closed fracture: Secondary | ICD-10-CM | POA: Diagnosis not present

## 2021-09-11 DIAGNOSIS — S92355D Nondisplaced fracture of fifth metatarsal bone, left foot, subsequent encounter for fracture with routine healing: Secondary | ICD-10-CM

## 2021-09-11 DIAGNOSIS — M7061 Trochanteric bursitis, right hip: Secondary | ICD-10-CM

## 2021-09-11 MED ORDER — AMBULATORY NON FORMULARY MEDICATION: 0 refills | Status: DC

## 2021-09-11 NOTE — Progress Notes (Signed)
? ?I, Wendy Poet, LAT, ATC, am serving as scribe for Dr. Lynne Leader. ? ?Whitney Lewis is a 61 y.o. female who presents to [place] at [facility] today for f/u of R hip pain due to trochanteric bursitis and L 5th MT fx that occurred on 08/28/21.  She was last seen by Dr. Georgina Snell on 05/21/21 after having a R GT steroid injection on 04/21/21 and was doing well.  She was referred to PT in Feb 2023 and has completed 7 visits.  Today, pt reports that she has been seeing podiatry for her L 5th MT fx and is currently in a short walking boot.  Her R lateral hip pain has been flared up since she fractured her L 5th MT.  Her PT did give her heel lift to try to level off her leg lengths but it's not enough.  She has also been recently diagnosed w/ osteopenia and would like to discuss that as well. ? ? ? ?Pertinent review of systems: No fevers or chills ? ?Relevant historical information: Osteopenia.  T score -1.1. ? ? ? ?Exam:  ?BP 120/70 (BP Location: Right Arm, Patient Position: Sitting, Cuff Size: Normal)   Pulse 85   Ht '5\' 6"'$  (1.676 m)   Wt 146 lb 6.4 oz (66.4 kg)   SpO2 97%   BMI 23.63 kg/m?  ?General: Well Developed, well nourished, and in no acute distress.  ? ?MSK: Right hip normal-appearing ?Tender at lateral hip.  Normal range of motion pain with gait with crutches. ?Left foot wearing cam walker boot.  Painful at lateral fifth metatarsal. ? ? ? ?Lab and Radiology Results ? ?X-ray images left foot obtained today personally and independently interpreted. ?Fifth metatarsal avulsion fracture present.  Minimal displacement.  Periosteal reabsorption at fracture site present. ?Await formal radiology review ? ? ? ? ?Assessment and Plan: ?61 y.o. female with right lateral hip pain.  This is an acute exacerbation of a chronic issue of greater trochanteric bursitis.  She is using crutches and a left cam walker boot and minimal weightbearing.  This puts more stress to her right hip and leg  especially her hip abductor muscles.  This is exacerbated her prior hip abductor tendinopathy/trochanteric bursitis.  Would like to avoid steroids as it would interfere with bone healing for her left foot fracture.  Plan to refer back to PT.  Also strategized around her left cam walker boot.  Recommend knee scooter and an even up shoe for her right foot to help improve her leg length discrepancy that she is having now with her cam walker boot. ? ?Additionally the left foot fracture.  We did obtain an x-ray today but then we subsequently learned that she actually is getting care for this issue with the podiatrist.  X-ray per my read looks pretty good and I think this will heal well.  She is getting great care with a cam walker boot and minimal weightbearing.  I do not have much more to add.  Recommend knee scooter and even up as above.  Happy to take over if needed although I think she will be well cared for with podiatry. ? ?Osteopenia.  I do not think her fracture is a fragility fracture.  Plan for vitamin D and calcium.  Discussed how to take this successfully. ? ? ?Plan to recheck in a month. ? ? ?PDMP not reviewed this encounter. ?Orders Placed This Encounter  ?Procedures  ? DG Foot Complete Left  ?  Standing  Status:   Future  ?  Number of Occurrences:   1  ?  Standing Expiration Date:   10/11/2021  ?  Order Specific Question:   Reason for Exam (SYMPTOM  OR DIAGNOSIS REQUIRED)  ?  Answer:   L foot pain  ?  Order Specific Question:   Preferred imaging location?  ?  Answer:   [facility]  ? Ambulatory referral to Physical Therapy  ?  Referral Priority:   Routine  ?  Referral Type:   Physical Medicine  ?  Referral Reason:   Specialty Services Required  ?  Requested Specialty:   Physical Therapy  ?  Number of Visits Requested:   1  ? ?Meds ordered this encounter  ?Medications  ? AMBULATORY NON FORMULARY MEDICATION  ?  Sig: Knee scooter. Use as needed.  ?Disp 1 ?Ok to rent  ? S92.309A  ?  Dispense:  1 each  ?   Refill:  0  ? ? ? ?Discussed warning signs or symptoms. Please see discharge instructions. Patient expresses understanding. ? ? ?The above documentation has been reviewed and is accurate and complete Lynne Leader, M.D. ? ? ?

## 2021-09-11 NOTE — Patient Instructions (Addendum)
Good to see you today. ? ?I've placed a new PT order so you can resume treatment for your R hip.  Please call [place] office to schedule at [phone]. ? ?Follow-up: one month ?

## 2021-09-15 NOTE — Progress Notes (Signed)
Foot x-ray does show that fracture through the fifth metatarsal that we talked about in clinic.  I think you are being treated correctly. ?You do also have some arthritis changes in the toe.

## 2021-09-23 ENCOUNTER — Ambulatory Visit (INDEPENDENT_AMBULATORY_CARE_PROVIDER_SITE_OTHER): Payer: PPO | Admitting: Physical Therapy

## 2021-09-23 ENCOUNTER — Encounter: Payer: Self-pay | Admitting: Physical Therapy

## 2021-09-23 DIAGNOSIS — M25572 Pain in left ankle and joints of left foot: Secondary | ICD-10-CM

## 2021-09-23 DIAGNOSIS — M545 Low back pain, unspecified: Secondary | ICD-10-CM | POA: Diagnosis not present

## 2021-09-23 DIAGNOSIS — M25551 Pain in right hip: Secondary | ICD-10-CM | POA: Diagnosis not present

## 2021-09-23 NOTE — Therapy (Signed)
?OUTPATIENT PHYSICAL THERAPY TREATMENT/Re-Cert ? ? ?Patient Name: Whitney Lewis ?MRN: [id number] ?DOB:1961-01-20, 61 y.o., female ?Today's Date: 09/23/2021 ? ? ? ? PT End of Session - 09/23/21 1357   ? ? Visit Number 8   ? Number of Visits 12   ? Date for PT Re-Evaluation 11/04/21   ? Authorization Type HTA   re-cert done vist 7,  and visit 8,   ? PT Start Time 6295   ? PT Stop Time 2841   ? PT Time Calculation (min) 40 min   ? Activity Tolerance Patient tolerated treatment well   ? Behavior During Therapy [facility] for tasks assessed/performed   ? ?  ?  ? ?  ? ? ? ? ? ? ? ? ?Past Medical History:  ?Diagnosis Date  ? ADD (attention deficit disorder)   ? Anxiety   ? Arthritis   ? feet, knees (07/04/2015)  ? Bipolar disorder ([place])   ? Complication of anesthesia   ? hard to wake up, blood pressure drops   ? Depression   ? Family history of adverse reaction to anesthesia   ? "sister hard to wake up also"  ? Fibromyalgia dx'd ~ 2000  ? GERD (gastroesophageal reflux disease)   ? Hyperlipidemia   ? Hypertension   ? Migraine   ? "hormonal; stopped when I was 16" (07/04/2015)  ? OSA (obstructive sleep apnea)   ? wears mouth piece, no CPAP  ? Seizures ([place])   ? ?Past Surgical History:  ?Procedure Laterality Date  ? ABDOMINAL HYSTERECTOMY  ~ 1993  ? BACK SURGERY    ? [place]  ? COLONOSCOPY    ? COLONOSCOPY WITH PROPOFOL N/A 09/21/2017  ? Procedure: COLONOSCOPY WITH PROPOFOL;  Surgeon: Lollie Sails, MD;  Location: [facility] ENDOSCOPY;  Service: Endoscopy;  Laterality: N/A;  ? HAMMER TOE SURGERY Right 03/2015  ? HAMMER TOE SURGERY Left 06/06/2015  ? Procedure: LEFT TWO-THREE  HAMMER TOE CORRECTION ;  Surgeon: [name], MD;  Location: [place];  Service: Orthopedics;  Laterality: Left;  ? INCISION AND DRAINAGE OF WOUND Right 07/04/2015  ? Procedure: IRRIGATION AND DEBRIDEMENT OF RIGHT FOOT DEEP ABSCESS; EXCISION OF RIGHT SECOND METATARSAL HEAD; REMOVAL OF DEEP IMPLANT RIGHT FOOT; EXCISION OF RIGHT SECOND  TOE PROXIMAL PHALANX BASE; INSERTION OF ANTIBIOTIC BEADS INTO RIGHT FOOT ABSCESS;  Surgeon: [name], MD;  Location: [place];  Service: Orthopedics;  Laterality: Right;  SITE ALSO  RIGHT FOOT  ? IRRIGATION AND DEBRIDEMENT FOOT Right 07/04/2015  ? Irrigation and excisional debridement of right foot deep abscess;; Insertion of antibiotic beads into the right foot abscess  ? JOINT REPLACEMENT    ? MAXIMUM ACCESS (MAS)POSTERIOR LUMBAR INTERBODY FUSION (PLIF) 1 LEVEL N/A 01/01/2017  ? Procedure: L4-5 MAXIMUM ACCESS (MAS) POSTERIOR LUMBAR INTERBODY FUSION (PLIF);  Surgeon: Eustace Moore, MD;  Location: [place];  Service: Neurosurgery;  Laterality: N/A;  ? TOTAL KNEE ARTHROPLASTY Left   ? WEIL OSTEOTOMY Left 06/06/2015  ? Procedure: LEFT TWO AND THREE METATARSAL WEIL OSTEOTOMY ;  Surgeon: [name], MD;  Location: [place];  Service: Orthopedics;  Laterality: Left;  ? ?Patient Active Problem List  ? Diagnosis Date Noted  ? Metatarsal fracture 09/11/2021  ? S/P lumbar spinal fusion 01/01/2017  ? ADD (attention deficit disorder) 07/17/2015  ? Bipolar affective disorder ([place]) 07/17/2015  ? HLD (hyperlipidemia) 07/17/2015  ? BP (high blood pressure) 07/17/2015  ? Obstructive apnea 07/17/2015  ? Staphylococcus aureus infection   ?  Foot osteomyelitis, right ([place]) 07/04/2015  ? ? ?PCP: Velna Hatchet, MD ? ?REFERRING PROVIDER: Lynne Leader ? ?REFERRING DIAG: R hip pain ? ?THERAPY DIAG:  ?Pain in right hip ? ?Acute right-sided low back pain without sciatica ? ?Pain in left ankle and joints of left foot ? ?ONSET DATE:  1 month ago.  ? ?SUBJECTIVE:                                                                                                                                                                                          ? ?SUBJECTIVE STATEMENT: ?Pt last seen 3/28, was doing very well at that time. She then was in [place] and Rolled ankle, while walking dog, was inside wearing slippers, found to have 5 th met  fracture.  Currently wearing cam boot and using 2 crutches. F/U appt 5/10, still NWB at this time. Since hurting L foot, pt has been having significant increase in R hip pain. Most pain in glute, and anterior/lat hip. Significant pain and difficulty with sleep. L foot with minimal pain at this time.  ? ? ?PERTINENT HISTORY:  ?L TKA,  Lumbar fuston L4/5 ,  ? ?PAIN:  ?Are you having pain? Yes ?NPRS scale: 0-7 /10 ?Pain location: hip/glute ?Pain orientation: Right  ?PAIN TYPE: aching ?Pain description: intermittent  ?Aggravating factors: sleeping,  ?Relieving factors: stretching. ? ?Are you having pain? Yes ?NPRS scale: 0-1/10 ?Pain location: L foot ?Pain orientation: Left ?PAIN TYPE: aching ?Pain description: intermittent  ?Aggravating factors:  ?Relieving factors:  ? ? ? ?PRECAUTIONS: None ? ?WEIGHT BEARING RESTRICTIONS No ? ?FALLS:  ?Has patient fallen in last 6 months? Yes, Number of falls: 1-  tripped when walking dog in April.  ? ?OCCUPATION: Retired,  ? ?PLOF: Independent ? ?PATIENT GOALS  Return to regular exercise, walking.  ? ? ?OBJECTIVE:  ?Updated 09/23/21 ?DIAGNOSTIC FINDINGS: none for hip or back.  ? ?COGNITION: ? Overall cognitive status: Within functional limits for tasks assessed  ?POSTURE:  ? ?PALPATION: ?Soreness in R glute, piriformis, into gr troch and TFL  ?  ?AROM-   Lumbar: WFL,  Hips: mild limitation for R flexion/ rotation,  L knee: lacking full flexion (from previous TKA) ;  ? ?MMT:  ?Hips: 4+/5 ;  Knees: 5/5,   ? ?GAIT: Using 2 crutches, NWB, in boot on L;  ? ? ?TODAY'S TREATMENT  ?09/23/21: ?Aerobic:  ?Supine: Bridging x 20;   ?S/L: Hip abd x10 on R, clam x 10 on R;  ?Seated: ?Standing:   ?Stretches:  piriformis hip ER  30 sec x 3 on R;  LTR x 10; DKTC 30 sec x 3;  ?  Manual: long leg distraction bil for R hip and lumbar; Hip inf and post mobs on R; Hip flexor stretch off side of table,  manual assist.  ?Gait: 2 crutches, NWB, reviewed optimal mechanics.   ? ? ?PATIENT EDUCATION:  ?Education  details: Updated and reviewed HEP,  ?Person educated: Patient ?Education method: Explanation, Demonstration, Tactile cues, Verbal cues, and Handouts ?Education comprehension: verbalized understanding, returned demonstration, verbal cues required, tactile cues required, and needs further education ? ? ?HOME EXERCISE PROGRAM: ?Access Code: [email or web address] ? ?ASSESSMENT: ? ?CLINICAL IMPRESSION: ?09/23/21: Pt presents with new dx of L foot fracture. She is having minimal pain and it appears to be healing based on last MD note. She is having signficant pain in R hip, likely as a result of increased weight bearing and pressure since NWB on L. She has increased pain and tenderness in anterior, lateral, and posterior hip musculature, and is having increased difficulty with mobility as well as sleeping. Pt with MD f/u 5/10-discussed limiting prolonged standing activity until then, to help decrease hip pain. Pt will require more treatment for foot, ankle pain, strength, and gait at that time, when she is able to come out of boot. Pt with decreased ability for full functional activities at this time due to pain and deficit, and will benefit from skilled PT to improve.  ? ? ?OBJECTIVE IMPAIRMENTS Abnormal gait, decreased activity tolerance, decreased balance, decreased knowledge of use of DME, decreased mobility, difficulty walking, decreased ROM, decreased strength, increased muscle spasms, impaired flexibility, improper body mechanics, and pain.  ? ?ACTIVITY LIMITATIONS cleaning, community activity, meal prep, laundry, and yard work.  ? ?PERSONAL FACTORS Past/current experiences /foot fracture ; are also affecting patient's functional outcome.  ? ? ?REHAB POTENTIAL: Good ? ?CLINICAL DECISION MAKING: Stable/uncomplicated ? ?EVALUATION COMPLEXITY: Low ? ? ?GOALS: ?Goals reviewed with patient? Yes ? ?SHORT TERM GOALS: ? ?STG Name Target Date Goal status  ?1 Patient will be independent with initial HEP  ? 10/07/21 INITIAL  ?     ?  ?     ? ?LONG TERM GOALS:  created 09/23/21 ? ?LTG Name Target Date Goal status  ?1 Patient will be independent with final HEP ? 11/04/21 INITIAL  ?2 Pt to report decreased pain in R hip to be to 0-2/10 with standing a

## 2021-09-25 ENCOUNTER — Encounter: Payer: PPO | Admitting: Physical Therapy

## 2021-10-01 ENCOUNTER — Ambulatory Visit (INDEPENDENT_AMBULATORY_CARE_PROVIDER_SITE_OTHER): Payer: PPO | Admitting: Physical Therapy

## 2021-10-01 ENCOUNTER — Encounter: Payer: Self-pay | Admitting: Physical Therapy

## 2021-10-01 DIAGNOSIS — M25551 Pain in right hip: Secondary | ICD-10-CM

## 2021-10-01 DIAGNOSIS — M25572 Pain in left ankle and joints of left foot: Secondary | ICD-10-CM | POA: Diagnosis not present

## 2021-10-01 DIAGNOSIS — M545 Low back pain, unspecified: Secondary | ICD-10-CM | POA: Diagnosis not present

## 2021-10-01 NOTE — Therapy (Signed)
?OUTPATIENT PHYSICAL THERAPY TREATMENT ? ? ?Patient Name: Whitney Lewis ?MRN: [id number] ?DOB:10/28/1960, 61 y.o., female ?Today's Date: 10/01/2021 ? ? ? ? PT End of Session - 10/01/21 1146   ? ? Visit Number 9   ? Number of Visits 12   ? Date for PT Re-Evaluation 11/04/21   ? Authorization Type HTA   re-cert done vist 7,  and visit 8,   ? PT Start Time 1105   ? PT Stop Time 1144   ? PT Time Calculation (min) 39 min   ? Activity Tolerance Patient tolerated treatment well   ? Behavior During Therapy [facility] for tasks assessed/performed   ? ?  ?  ? ?  ? ? ? ? ? ? ? ? ? ?Past Medical History:  ?Diagnosis Date  ? ADD (attention deficit disorder)   ? Anxiety   ? Arthritis   ? feet, knees (07/04/2015)  ? Bipolar disorder ([place])   ? Complication of anesthesia   ? hard to wake up, blood pressure drops   ? Depression   ? Family history of adverse reaction to anesthesia   ? "sister hard to wake up also"  ? Fibromyalgia dx'd ~ 2000  ? GERD (gastroesophageal reflux disease)   ? Hyperlipidemia   ? Hypertension   ? Migraine   ? "hormonal; stopped when I was 16" (07/04/2015)  ? OSA (obstructive sleep apnea)   ? wears mouth piece, no CPAP  ? Seizures ([place])   ? ?Past Surgical History:  ?Procedure Laterality Date  ? ABDOMINAL HYSTERECTOMY  ~ 1993  ? BACK SURGERY    ? [place]  ? COLONOSCOPY    ? COLONOSCOPY WITH PROPOFOL N/A 09/21/2017  ? Procedure: COLONOSCOPY WITH PROPOFOL;  Surgeon: Lollie Sails, MD;  Location: [facility] ENDOSCOPY;  Service: Endoscopy;  Laterality: N/A;  ? HAMMER TOE SURGERY Right 03/2015  ? HAMMER TOE SURGERY Left 06/06/2015  ? Procedure: LEFT TWO-THREE  HAMMER TOE CORRECTION ;  Surgeon: [name], MD;  Location: [place];  Service: Orthopedics;  Laterality: Left;  ? INCISION AND DRAINAGE OF WOUND Right 07/04/2015  ? Procedure: IRRIGATION AND DEBRIDEMENT OF RIGHT FOOT DEEP ABSCESS; EXCISION OF RIGHT SECOND METATARSAL HEAD; REMOVAL OF DEEP IMPLANT RIGHT FOOT; EXCISION OF RIGHT SECOND TOE  PROXIMAL PHALANX BASE; INSERTION OF ANTIBIOTIC BEADS INTO RIGHT FOOT ABSCESS;  Surgeon: [name], MD;  Location: [place];  Service: Orthopedics;  Laterality: Right;  SITE ALSO  RIGHT FOOT  ? IRRIGATION AND DEBRIDEMENT FOOT Right 07/04/2015  ? Irrigation and excisional debridement of right foot deep abscess;; Insertion of antibiotic beads into the right foot abscess  ? JOINT REPLACEMENT    ? MAXIMUM ACCESS (MAS)POSTERIOR LUMBAR INTERBODY FUSION (PLIF) 1 LEVEL N/A 01/01/2017  ? Procedure: L4-5 MAXIMUM ACCESS (MAS) POSTERIOR LUMBAR INTERBODY FUSION (PLIF);  Surgeon: Eustace Moore, MD;  Location: [place];  Service: Neurosurgery;  Laterality: N/A;  ? TOTAL KNEE ARTHROPLASTY Left   ? WEIL OSTEOTOMY Left 06/06/2015  ? Procedure: LEFT TWO AND THREE METATARSAL WEIL OSTEOTOMY ;  Surgeon: [name], MD;  Location: [place];  Service: Orthopedics;  Laterality: Left;  ? ?Patient Active Problem List  ? Diagnosis Date Noted  ? Metatarsal fracture 09/11/2021  ? S/P lumbar spinal fusion 01/01/2017  ? ADD (attention deficit disorder) 07/17/2015  ? Bipolar affective disorder ([place]) 07/17/2015  ? HLD (hyperlipidemia) 07/17/2015  ? BP (high blood pressure) 07/17/2015  ? Obstructive apnea 07/17/2015  ? Staphylococcus aureus  infection   ? Foot osteomyelitis, right ([place]) 07/04/2015  ? ? ?PCP: Velna Hatchet, MD ? ?REFERRING PROVIDER: Lynne Leader ? ?REFERRING DIAG: R hip pain ? ?THERAPY DIAG:  ?Pain in right hip ? ?Acute right-sided low back pain without sciatica ? ?Pain in left ankle and joints of left foot ? ?ONSET DATE:  1 month ago.  ? ?SUBJECTIVE:                                                                                                                                                                                          ? ?SUBJECTIVE STATEMENT: ?Pt still with significant pain in R hip. She saw foot dr today, ok to start WBAT in boot, and in 1 week, will move to wearing shoe. Still using 2 crutches.   ? ? ?PERTINENT HISTORY:  ?L TKA,  Lumbar fuston L4/5 ,  ? ?PAIN:  ?Are you having pain? Yes ?NPRS scale: 0-7 /10 ?Pain location: hip/glute ?Pain orientation: Right  ?PAIN TYPE: aching ?Pain description: intermittent  ?Aggravating factors: sleeping,  ?Relieving factors: stretching. ? ?Are you having pain? Yes ?NPRS scale: 0-1/10 ?Pain location: L foot ?Pain orientation: Left ?PAIN TYPE: aching ?Pain description: intermittent  ?Aggravating factors:  ?Relieving factors:  ? ? ? ?PRECAUTIONS: None ? ?WEIGHT BEARING RESTRICTIONS No ? ?FALLS:  ?Has patient fallen in last 6 months? Yes, Number of falls: 1-  tripped when walking dog in April.  ? ?OCCUPATION: Retired,  ? ?PLOF: Independent ? ?PATIENT GOALS  Return to regular exercise, walking.  ? ? ?OBJECTIVE:  ?Updated 09/23/21 ?DIAGNOSTIC FINDINGS: none for hip or back.  ? ?COGNITION: ? Overall cognitive status: Within functional limits for tasks assessed  ?POSTURE:  ? ?PALPATION: ?Soreness in R glute, piriformis, into gr troch and TFL  ?  ?AROM-   Lumbar: WFL,  Hips: mild limitation for R flexion/ rotation,  L knee: lacking full flexion (from previous TKA) ;  ? ?MMT:  ?Hips: 4+/5 ;  Knees: 5/5,   ? ?GAIT: Using 2 crutches, NWB, in boot on L;  ? ? ?TODAY'S TREATMENT  ?10/01/21: ?Aerobic:  ?Supine:   ?S/L:  ?Seated: ?Standing:   ?Stretches:  ITB with strap 30 sec x 3 on R;  piriformis hip ER  30 sec x 3 on R;   ?Manual: long leg distraction bil for R hip and lumbar; DTM and TPR to R glute;   ?Gait: 2 crutches, WBAT, education and practice for  optimal mechanics for 2 crutch and 1 crutch use.  ?Trigger Point Dry-Needling  ?Treatment instructions: Expect mild to moderate muscle soreness. S/S of pneumothorax if dry needled over a lung field,  and to seek immediate medical attention should they occur. Patient verbalized understanding of these instructions and education. ? ?Patient Consent Given: Yes ?Education handout provided: Yes ?Muscles treated: R glute min,  piriformis ?Electrical stimulation performed: No ?Parameters: N/A ?Treatment response/outcome: palpable increase in muscle length;  ? ? ?PATIENT EDUCATION:  ?Education details: Updated and reviewed HEP,  ?Person educated: Patient ?Education method: Explanation, Demonstration, Tactile cues, Verbal cues, and Handouts ?Education comprehension: verbalized understanding, returned demonstration, verbal cues required, tactile cues required, and needs further education ? ? ?HOME EXERCISE PROGRAM: ?Access Code: [email or web address] ? ?ASSESSMENT: ? ?CLINICAL IMPRESSION: ?10/01/21: Pt with continued pain in R hip/glute, addressed with focus on manual therapy today. Pt with good tolerance for dry needling. Education for gait mechanics with WBAT status, and to continue use of crutches at this time. Will wean from crutches as pain allows.  ? ? ?OBJECTIVE IMPAIRMENTS Abnormal gait, decreased activity tolerance, decreased balance, decreased knowledge of use of DME, decreased mobility, difficulty walking, decreased ROM, decreased strength, increased muscle spasms, impaired flexibility, improper body mechanics, and pain.  ? ?ACTIVITY LIMITATIONS cleaning, community activity, meal prep, laundry, and yard work.  ? ?PERSONAL FACTORS Past/current experiences /foot fracture ; are also affecting patient's functional outcome.  ? ? ?REHAB POTENTIAL: Good ? ?CLINICAL DECISION MAKING: Stable/uncomplicated ? ?EVALUATION COMPLEXITY: Low ? ? ?GOALS: ?Goals reviewed with patient? Yes ? ?SHORT TERM GOALS: ? ?STG Name Target Date Goal status  ?1 Patient will be independent with initial HEP  ? 10/07/21 INITIAL  ?     ?  ?    ? ?LONG TERM GOALS:  created 09/23/21 ? ?LTG Name Target Date Goal status  ?1 Patient will be independent with final HEP ? 11/04/21 INITIAL  ?2 Pt to report decreased pain in R hip to be to 0-2/10 with standing activity  ? 11/04/21 INITIAL  ?3 Pt to demonstrate improved strength of L  foot/ankle to be [facility] for improved ability for gait and stairs.    ? 11/04/21 INITIAL  ?4 Pt to demonstrate ability for independent ambulation, for community distances, with foot , knee, hip mechanics WNL, to improve pain and ability for community activity.  ? 11/04/21 INITIAL  ?5  ?    ? ? ? ?PLAN:

## 2021-10-03 ENCOUNTER — Ambulatory Visit (INDEPENDENT_AMBULATORY_CARE_PROVIDER_SITE_OTHER): Payer: PPO | Admitting: Physical Therapy

## 2021-10-03 DIAGNOSIS — M25572 Pain in left ankle and joints of left foot: Secondary | ICD-10-CM

## 2021-10-03 DIAGNOSIS — M25551 Pain in right hip: Secondary | ICD-10-CM

## 2021-10-03 DIAGNOSIS — M545 Low back pain, unspecified: Secondary | ICD-10-CM

## 2021-10-03 NOTE — Therapy (Signed)
?OUTPATIENT PHYSICAL THERAPY TREATMENT ? ? ?Patient Name: Whitney Lewis ?MRN: [id number] ?DOB:[birth year], 61 y.o., female ?Today's Date: 10/03/2021 ? ? ? ? PT End of Session - 10/05/21 0801   ? ? Visit Number 10   ? Number of Visits 12   ? Date for PT Re-Evaluation 11/04/21   ? Authorization Type HTA   re-cert done vist 7,  and visit 8,   ? PT Start Time 1148   ? PT Stop Time 1230   ? PT Time Calculation (min) 42 min   ? Activity Tolerance Patient tolerated treatment well   ? Behavior During Therapy [facility] for tasks assessed/performed   ? ?  ?  ? ?  ? ? ? ? ? ? ? ? ? ? ?Past Medical History:  ?Diagnosis Date  ? ADD (attention deficit disorder)   ? Anxiety   ? Arthritis   ? feet, knees (07/04/2015)  ? Bipolar disorder ([place])   ? Complication of anesthesia   ? hard to wake up, blood pressure drops   ? Depression   ? Family history of adverse reaction to anesthesia   ? "sister hard to wake up also"  ? Fibromyalgia dx'd ~ 2000  ? GERD (gastroesophageal reflux disease)   ? Hyperlipidemia   ? Hypertension   ? Migraine   ? "hormonal; stopped when I was 16" (07/04/2015)  ? OSA (obstructive sleep apnea)   ? wears mouth piece, no CPAP  ? Seizures ([place])   ? ?Past Surgical History:  ?Procedure Laterality Date  ? ABDOMINAL HYSTERECTOMY  ~ 1993  ? BACK SURGERY    ? [place]  ? COLONOSCOPY    ? COLONOSCOPY WITH PROPOFOL N/A 09/21/2017  ? Procedure: COLONOSCOPY WITH PROPOFOL;  Surgeon: Lollie Sails, MD;  Location: [facility] ENDOSCOPY;  Service: Endoscopy;  Laterality: N/A;  ? HAMMER TOE SURGERY Right 03/2015  ? HAMMER TOE SURGERY Left 06/06/2015  ? Procedure: LEFT TWO-THREE  HAMMER TOE CORRECTION ;  Surgeon: [name], MD;  Location: [place];  Service: Orthopedics;  Laterality: Left;  ? INCISION AND DRAINAGE OF WOUND Right 07/04/2015  ? Procedure: IRRIGATION AND DEBRIDEMENT OF RIGHT FOOT DEEP ABSCESS; EXCISION OF RIGHT SECOND METATARSAL HEAD; REMOVAL OF DEEP IMPLANT RIGHT FOOT; EXCISION OF RIGHT SECOND TOE  PROXIMAL PHALANX BASE; INSERTION OF ANTIBIOTIC BEADS INTO RIGHT FOOT ABSCESS;  Surgeon: [name], MD;  Location: [place];  Service: Orthopedics;  Laterality: Right;  SITE ALSO  RIGHT FOOT  ? IRRIGATION AND DEBRIDEMENT FOOT Right 07/04/2015  ? Irrigation and excisional debridement of right foot deep abscess;; Insertion of antibiotic beads into the right foot abscess  ? JOINT REPLACEMENT    ? MAXIMUM ACCESS (MAS)POSTERIOR LUMBAR INTERBODY FUSION (PLIF) 1 LEVEL N/A 01/01/2017  ? Procedure: L4-5 MAXIMUM ACCESS (MAS) POSTERIOR LUMBAR INTERBODY FUSION (PLIF);  Surgeon: Eustace Moore, MD;  Location: [place];  Service: Neurosurgery;  Laterality: N/A;  ? TOTAL KNEE ARTHROPLASTY Left   ? WEIL OSTEOTOMY Left 06/06/2015  ? Procedure: LEFT TWO AND THREE METATARSAL WEIL OSTEOTOMY ;  Surgeon: [name], MD;  Location: [place];  Service: Orthopedics;  Laterality: Left;  ? ?Patient Active Problem List  ? Diagnosis Date Noted  ? Metatarsal fracture 09/11/2021  ? S/P lumbar spinal fusion 01/01/2017  ? ADD (attention deficit disorder) 07/17/2015  ? Bipolar affective disorder ([place]) 07/17/2015  ? HLD (hyperlipidemia) 07/17/2015  ? BP (high blood pressure) 07/17/2015  ? Obstructive apnea 07/17/2015  ? Staphylococcus  aureus infection   ? Foot osteomyelitis, right ([place]) 07/04/2015  ? ? ?PCP: Velna Hatchet, MD ? ?REFERRING PROVIDER: Lynne Leader ? ?REFERRING DIAG: R hip pain ? ?THERAPY DIAG:  ?Pain in right hip ? ?Acute right-sided low back pain without sciatica ? ?Pain in left ankle and joints of left foot ? ?ONSET DATE:  1 month ago.  ? ?SUBJECTIVE:                                                                                                                                                                                          ? ?SUBJECTIVE STATEMENT: ?Pt has been using 2 crutches for walking. States her hip pain is improving from last visit.  ? ?PERTINENT HISTORY:  ?L TKA,  Lumbar fuston L4/5 ,  ? ?PAIN:  ?Are you  having pain? Yes ?NPRS scale: 0-7 /10 ?Pain location: hip/glute ?Pain orientation: Right  ?PAIN TYPE: aching ?Pain description: intermittent  ?Aggravating factors: sleeping,  ?Relieving factors: stretching. ? ?Are you having pain? Yes ?NPRS scale: 0-1/10 ?Pain location: L foot ?Pain orientation: Left ?PAIN TYPE: aching ?Pain description: intermittent  ?Aggravating factors:  ?Relieving factors:  ? ? ? ?PRECAUTIONS: None ? ?WEIGHT BEARING RESTRICTIONS No ? ?FALLS:  ?Has patient fallen in last 6 months? Yes, Number of falls: 1-  tripped when walking dog in April.  ? ?OCCUPATION: Retired,  ? ?PLOF: Independent ? ?PATIENT GOALS  Return to regular exercise, walking.  ? ? ?OBJECTIVE:  ?Updated 09/23/21 ?DIAGNOSTIC FINDINGS: none for hip or back.  ? ?COGNITION: ? Overall cognitive status: Within functional limits for tasks assessed  ?POSTURE:  ? ?PALPATION: ?Soreness in R glute, piriformis, into gr troch and TFL  ?  ?AROM-   Lumbar: WFL,  Hips: mild limitation for R flexion/ rotation,  L knee: lacking full flexion (from previous TKA) ;  ? ?MMT:  ?Hips: 4+/5 ;  Knees: 5/5,   ? ?GAIT: Using 2 crutches, NWB, in boot on L;  ? ? ?TODAY'S TREATMENT  ?10/03/21: ?Aerobic:  ?Supine:  Clams blue TB x 20; Bridging x 20;  ?S/L:  ?Seated: ?Standing:   ?Stretches:  ITB with strap 30 sec x 3 on R;  piriformis hip ER  30 sec x 3 on R supine and seated; SKTC 30 sec x3 on R;  ?Manual: long leg distraction bil for R hip and lumbar; DTM and TPR to R lumbar, R glute;  SI mobs ?Gait: 1 crutches, WBAT, education and practice for  optimal mechanics for 1 crutch  ? ? ? ?PATIENT EDUCATION:  ?Education details:reviewed HEP,  ?Person educated: Patient ?Education method: Explanation, Demonstration, Tactile cues, Verbal cues, and  Handouts ?Education comprehension: verbalized understanding, returned demonstration, verbal cues required, tactile cues required, and needs further education ? ? ?HOME EXERCISE PROGRAM: ?Access Code:  [email or web address] ? ?ASSESSMENT: ? ?CLINICAL IMPRESSION: ?10/03/21: Pt with much less tenderness in glute and piriformis today from previous visit. She does have tenderness in R low back and SI today. She has improving ability for ambulation with 2 and 1 crutch. Cued for shorter step length on L. She will use 1-2 crutches depending on terrain and pain level.  ? ? ?OBJECTIVE IMPAIRMENTS Abnormal gait, decreased activity tolerance, decreased balance, decreased knowledge of use of DME, decreased mobility, difficulty walking, decreased ROM, decreased strength, increased muscle spasms, impaired flexibility, improper body mechanics, and pain.  ? ?ACTIVITY LIMITATIONS cleaning, community activity, meal prep, laundry, and yard work.  ? ?PERSONAL FACTORS Past/current experiences /foot fracture ; are also affecting patient's functional outcome.  ? ? ?REHAB POTENTIAL: Good ? ?CLINICAL DECISION MAKING: Stable/uncomplicated ? ?EVALUATION COMPLEXITY: Low ? ? ?GOALS: ?Goals reviewed with patient? Yes ? ?SHORT TERM GOALS: ? ?STG Name Target Date Goal status  ?1 Patient will be independent with initial HEP  ? 10/07/21 INITIAL  ?     ?  ?    ? ?LONG TERM GOALS:  created 09/23/21 ? ?LTG Name Target Date Goal status  ?1 Patient will be independent with final HEP ? 11/04/21 INITIAL  ?2 Pt to report decreased pain in R hip to be to 0-2/10 with standing activity  ? 11/04/21 INITIAL  ?3 Pt to demonstrate improved strength of L  foot/ankle to be [facility] for improved ability for gait and stairs.   ? 11/04/21 INITIAL  ?4 Pt to demonstrate ability for independent ambulation, for community distances, with foot , knee, hip mechanics WNL, to improve pain and ability for community activity.  ? 11/04/21 INITIAL  ?5  ?    ? ? ? ?PLAN: ?PT FREQUENCY: 1-2x/week ? ?PT DURATION: 6 weeks ? ?PLANNED INTERVENTIONS: Therapeutic exercises, Therapeutic activity, Neuro Muscular re-education, Balance training, Gait training, Patient/Family education, Joint mobilization, Stair  training, DME instructions, Dry Needling, Electrical stimulation, Spinal mobilization, Moist heat, Taping, Traction, Ultrasound, Ionotophoresis '4mg'$ /ml Dexamethasone, and Manual therapy ? ?PLAN FOR NEXT SESSION:  manual, ther ex as tolerat

## 2021-10-05 ENCOUNTER — Encounter: Payer: Self-pay | Admitting: Physical Therapy

## 2021-10-06 ENCOUNTER — Encounter: Payer: Self-pay | Admitting: Physical Therapy

## 2021-10-06 ENCOUNTER — Ambulatory Visit (INDEPENDENT_AMBULATORY_CARE_PROVIDER_SITE_OTHER): Payer: PPO | Admitting: Physical Therapy

## 2021-10-06 DIAGNOSIS — M545 Low back pain, unspecified: Secondary | ICD-10-CM | POA: Diagnosis not present

## 2021-10-06 DIAGNOSIS — M25551 Pain in right hip: Secondary | ICD-10-CM

## 2021-10-06 DIAGNOSIS — M25572 Pain in left ankle and joints of left foot: Secondary | ICD-10-CM | POA: Diagnosis not present

## 2021-10-06 NOTE — Therapy (Signed)
?OUTPATIENT PHYSICAL THERAPY TREATMENT ? ? ?Patient Name: Whitney Lewis ?MRN: [id number] ?DOB:[birth year], 61 y.o., female ?Today's Date: 10/06/2021 ? ? ? ? PT End of Session - 10/06/21 1117   ? ? Visit Number 11   ? Number of Visits 12   ? Date for PT Re-Evaluation 11/04/21   ? Authorization Type HTA   re-cert done vist 7,  and visit 8,   ? PT Start Time 1106   ? PT Stop Time 1145   ? PT Time Calculation (min) 39 min   ? Activity Tolerance Patient tolerated treatment well   ? Behavior During Therapy [facility] for tasks assessed/performed   ? ?  ?  ? ?  ? ? ? ? ? ? ? ? ? ? ? ?Past Medical History:  ?Diagnosis Date  ? ADD (attention deficit disorder)   ? Anxiety   ? Arthritis   ? feet, knees (07/04/2015)  ? Bipolar disorder ([place])   ? Complication of anesthesia   ? hard to wake up, blood pressure drops   ? Depression   ? Family history of adverse reaction to anesthesia   ? "sister hard to wake up also"  ? Fibromyalgia dx'd ~ 2000  ? GERD (gastroesophageal reflux disease)   ? Hyperlipidemia   ? Hypertension   ? Migraine   ? "hormonal; stopped when I was 16" (07/04/2015)  ? OSA (obstructive sleep apnea)   ? wears mouth piece, no CPAP  ? Seizures ([place])   ? ?Past Surgical History:  ?Procedure Laterality Date  ? ABDOMINAL HYSTERECTOMY  ~ 1993  ? BACK SURGERY    ? [place]  ? COLONOSCOPY    ? COLONOSCOPY WITH PROPOFOL N/A 09/21/2017  ? Procedure: COLONOSCOPY WITH PROPOFOL;  Surgeon: Lollie Sails, MD;  Location: [facility] ENDOSCOPY;  Service: Endoscopy;  Laterality: N/A;  ? HAMMER TOE SURGERY Right 03/2015  ? HAMMER TOE SURGERY Left 06/06/2015  ? Procedure: LEFT TWO-THREE  HAMMER TOE CORRECTION ;  Surgeon: [name], MD;  Location: [place];  Service: Orthopedics;  Laterality: Left;  ? INCISION AND DRAINAGE OF WOUND Right 07/04/2015  ? Procedure: IRRIGATION AND DEBRIDEMENT OF RIGHT FOOT DEEP ABSCESS; EXCISION OF RIGHT SECOND METATARSAL HEAD; REMOVAL OF DEEP IMPLANT RIGHT FOOT; EXCISION OF RIGHT SECOND  TOE PROXIMAL PHALANX BASE; INSERTION OF ANTIBIOTIC BEADS INTO RIGHT FOOT ABSCESS;  Surgeon: [name], MD;  Location: [place];  Service: Orthopedics;  Laterality: Right;  SITE ALSO  RIGHT FOOT  ? IRRIGATION AND DEBRIDEMENT FOOT Right 07/04/2015  ? Irrigation and excisional debridement of right foot deep abscess;; Insertion of antibiotic beads into the right foot abscess  ? JOINT REPLACEMENT    ? MAXIMUM ACCESS (MAS)POSTERIOR LUMBAR INTERBODY FUSION (PLIF) 1 LEVEL N/A 01/01/2017  ? Procedure: L4-5 MAXIMUM ACCESS (MAS) POSTERIOR LUMBAR INTERBODY FUSION (PLIF);  Surgeon: Eustace Moore, MD;  Location: [place];  Service: Neurosurgery;  Laterality: N/A;  ? TOTAL KNEE ARTHROPLASTY Left   ? WEIL OSTEOTOMY Left 06/06/2015  ? Procedure: LEFT TWO AND THREE METATARSAL WEIL OSTEOTOMY ;  Surgeon: [name], MD;  Location: [place];  Service: Orthopedics;  Laterality: Left;  ? ?Patient Active Problem List  ? Diagnosis Date Noted  ? Metatarsal fracture 09/11/2021  ? S/P lumbar spinal fusion 01/01/2017  ? ADD (attention deficit disorder) 07/17/2015  ? Bipolar affective disorder ([place]) 07/17/2015  ? HLD (hyperlipidemia) 07/17/2015  ? BP (high blood pressure) 07/17/2015  ? Obstructive apnea 07/17/2015  ?  Staphylococcus aureus infection   ? Foot osteomyelitis, right ([place]) 07/04/2015  ? ? ?PCP: Velna Hatchet, MD ? ?REFERRING PROVIDER: Lynne Leader ? ?REFERRING DIAG: R hip pain ? ?THERAPY DIAG:  ?Pain in right hip ? ?Acute right-sided low back pain without sciatica ? ?Pain in left ankle and joints of left foot ? ?ONSET DATE:  1 month ago.  ? ?SUBJECTIVE:                                                                                                                                                                                          ? ?SUBJECTIVE STATEMENT: ?Pt notes much less pain in hip. Has some soreness in R side of low back later in the day.  ? ?PERTINENT HISTORY:  ?L TKA,  Lumbar fuston L4/5 ,  ? ?PAIN:  ?Are you  having pain? Yes ?NPRS scale: 0-7 /10 ?Pain location: hip 0, back up to 7/10 ?Pain orientation: Right  ?PAIN TYPE: aching ?Pain description: intermittent  ?Aggravating factors: sleeping,  ?Relieving factors: stretching. ? ?Are you having pain? Yes ?NPRS scale: 0-1/10 ?Pain location: L foot ?Pain orientation: Left ?PAIN TYPE: aching ?Pain description: intermittent  ?Aggravating factors:  ?Relieving factors:  ? ? ? ?PRECAUTIONS: None ? ?WEIGHT BEARING RESTRICTIONS No ? ?FALLS:  ?Has patient fallen in last 6 months? Yes, Number of falls: 1-  tripped when walking dog in April.  ? ?OCCUPATION: Retired,  ? ?PLOF: Independent ? ?PATIENT GOALS  Return to regular exercise, walking.  ? ? ?OBJECTIVE:  ?Updated 09/23/21 ?DIAGNOSTIC FINDINGS: none for hip or back.  ? ?COGNITION: ? Overall cognitive status: Within functional limits for tasks assessed  ?POSTURE:  ? ?PALPATION: ?Soreness in R glute, piriformis, into gr troch and TFL  ?  ?AROM-   Lumbar: WFL,  Hips: mild limitation for R flexion/ rotation,  L knee: lacking full flexion (from previous TKA) ;  ? ?MMT:  ?Hips: 4+/5 ;  Knees: 5/5,   ? ?GAIT: Using 2 crutches, NWB, in boot on L;  ? ? ?TODAY'S TREATMENT  ?10/03/21: ?Aerobic:  ?Supine:  Clams blue TB x 20; Bridging x 20;  Ankle ROM, PF/DF/INV/EV x 10 ea; Toe flex/ext x 20;  SLR x 15 bil;  ?S/L:  ?Seated:  ?Standing:   ?Stretches:  piriformis hip ER  30 sec x 3 on R supine and seated; SKTC 30 sec x3 on R; Standing R QL stretch 30 sec x 3 . ?Manual: long leg distraction bil for R hip and lumbar; DTM and TPR to R lumbar, SI mobs.  ?Gait: 1 crutches, WBAT, 100 ft ? ?Trigger Point Dry-Needling  ?Treatment instructions: Expect mild  to moderate muscle soreness. S/S of pneumothorax if dry needled over a lung field, and to seek immediate medical attention should they occur. Patient verbalized understanding of these instructions and education. ? ?Patient Consent Given: Yes ?Education handout provided: Previously provided ?Muscles  treated: R lumbar multifidi ?Electrical stimulation performed: No ?Parameters: N/A ?Treatment response/outcome: Palpable increase in muscle length.  ? ? ?PATIENT EDUCATION:  ?Education details: reviewed HEP,  ?Person educated: Patient ?Education method: Explanation, Demonstration, Tactile cues, Verbal cues, and Handouts ?Education comprehension: verbalized understanding, returned demonstration, verbal cues required, tactile cues required, and needs further education ? ? ?HOME EXERCISE PROGRAM: ?Access Code: [email or web address] ? ?ASSESSMENT: ? ?CLINICAL IMPRESSION: ?10/03/21: Pt with much decreased pain in hip. She has most soreness in R low lumbar region. Addressed with manual tissue release and dry needling today. Will progress into WBAT in shoe at next visit .   ? ? ?OBJECTIVE IMPAIRMENTS Abnormal gait, decreased activity tolerance, decreased balance, decreased knowledge of use of DME, decreased mobility, difficulty walking, decreased ROM, decreased strength, increased muscle spasms, impaired flexibility, improper body mechanics, and pain.  ? ?ACTIVITY LIMITATIONS cleaning, community activity, meal prep, laundry, and yard work.  ? ?PERSONAL FACTORS Past/current experiences /foot fracture ; are also affecting patient's functional outcome.  ? ? ?REHAB POTENTIAL: Good ? ?CLINICAL DECISION MAKING: Stable/uncomplicated ? ?EVALUATION COMPLEXITY: Low ? ? ?GOALS: ?Goals reviewed with patient? Yes ? ?SHORT TERM GOALS: ? ?STG Name Target Date Goal status  ?1 Patient will be independent with initial HEP  ? 10/07/21 INITIAL  ?     ?  ?    ? ?LONG TERM GOALS:  created 09/23/21 ? ?LTG Name Target Date Goal status  ?1 Patient will be independent with final HEP ? 11/04/21 INITIAL  ?2 Pt to report decreased pain in R hip to be to 0-2/10 with standing activity  ? 11/04/21 INITIAL  ?3 Pt to demonstrate improved strength of L  foot/ankle to be [facility] for improved ability for gait and stairs.   ? 11/04/21 INITIAL  ?4 Pt to demonstrate ability for  independent ambulation, for community distances, with foot , knee, hip mechanics WNL, to improve pain and ability for community activity.  ? 11/04/21 INITIAL  ?5  ?    ? ? ? ?PLAN: ?PT FREQUENCY: 1-2x/week ? ?PT DURA

## 2021-10-08 ENCOUNTER — Encounter: Payer: Self-pay | Admitting: Physical Therapy

## 2021-10-08 ENCOUNTER — Ambulatory Visit (INDEPENDENT_AMBULATORY_CARE_PROVIDER_SITE_OTHER): Payer: PPO | Admitting: Physical Therapy

## 2021-10-08 DIAGNOSIS — M545 Low back pain, unspecified: Secondary | ICD-10-CM | POA: Diagnosis not present

## 2021-10-08 DIAGNOSIS — M25551 Pain in right hip: Secondary | ICD-10-CM | POA: Diagnosis not present

## 2021-10-08 DIAGNOSIS — M25572 Pain in left ankle and joints of left foot: Secondary | ICD-10-CM | POA: Diagnosis not present

## 2021-10-08 NOTE — Therapy (Signed)
?OUTPATIENT PHYSICAL THERAPY TREATMENT ? ? ?Patient Name: Whitney Lewis ?MRN: [id number] ?DOB:[birth year], 61 y.o., female ?Today's Date: 10/08/2021 ? ? ? ? PT End of Session - 10/08/21 1107   ? ? Visit Number 12   ? Number of Visits --   ? Date for PT Re-Evaluation 11/04/21   ? Authorization Type HTA   re-cert done vist 7,  and visit 8,   ? PT Start Time 1106   ? PT Stop Time 1145   ? PT Time Calculation (min) 39 min   ? Activity Tolerance Patient tolerated treatment well   ? Behavior During Therapy [facility] for tasks assessed/performed   ? ?  ?  ? ?  ? ? ? ? ? ? ? ? ? ? ? ?Past Medical History:  ?Diagnosis Date  ? ADD (attention deficit disorder)   ? Anxiety   ? Arthritis   ? feet, knees (07/04/2015)  ? Bipolar disorder ([place])   ? Complication of anesthesia   ? hard to wake up, blood pressure drops   ? Depression   ? Family history of adverse reaction to anesthesia   ? "sister hard to wake up also"  ? Fibromyalgia dx'd ~ 2000  ? GERD (gastroesophageal reflux disease)   ? Hyperlipidemia   ? Hypertension   ? Migraine   ? "hormonal; stopped when I was 16" (07/04/2015)  ? OSA (obstructive sleep apnea)   ? wears mouth piece, no CPAP  ? Seizures ([place])   ? ?Past Surgical History:  ?Procedure Laterality Date  ? ABDOMINAL HYSTERECTOMY  ~ 1993  ? BACK SURGERY    ? [place]  ? COLONOSCOPY    ? COLONOSCOPY WITH PROPOFOL N/A 09/21/2017  ? Procedure: COLONOSCOPY WITH PROPOFOL;  Surgeon: Lollie Sails, MD;  Location: [facility] ENDOSCOPY;  Service: Endoscopy;  Laterality: N/A;  ? HAMMER TOE SURGERY Right 03/2015  ? HAMMER TOE SURGERY Left 06/06/2015  ? Procedure: LEFT TWO-THREE  HAMMER TOE CORRECTION ;  Surgeon: [name], MD;  Location: [place];  Service: Orthopedics;  Laterality: Left;  ? INCISION AND DRAINAGE OF WOUND Right 07/04/2015  ? Procedure: IRRIGATION AND DEBRIDEMENT OF RIGHT FOOT DEEP ABSCESS; EXCISION OF RIGHT SECOND METATARSAL HEAD; REMOVAL OF DEEP IMPLANT RIGHT FOOT; EXCISION OF RIGHT SECOND  TOE PROXIMAL PHALANX BASE; INSERTION OF ANTIBIOTIC BEADS INTO RIGHT FOOT ABSCESS;  Surgeon: [name], MD;  Location: [place];  Service: Orthopedics;  Laterality: Right;  SITE ALSO  RIGHT FOOT  ? IRRIGATION AND DEBRIDEMENT FOOT Right 07/04/2015  ? Irrigation and excisional debridement of right foot deep abscess;; Insertion of antibiotic beads into the right foot abscess  ? JOINT REPLACEMENT    ? MAXIMUM ACCESS (MAS)POSTERIOR LUMBAR INTERBODY FUSION (PLIF) 1 LEVEL N/A 01/01/2017  ? Procedure: L4-5 MAXIMUM ACCESS (MAS) POSTERIOR LUMBAR INTERBODY FUSION (PLIF);  Surgeon: Eustace Moore, MD;  Location: [place];  Service: Neurosurgery;  Laterality: N/A;  ? TOTAL KNEE ARTHROPLASTY Left   ? WEIL OSTEOTOMY Left 06/06/2015  ? Procedure: LEFT TWO AND THREE METATARSAL WEIL OSTEOTOMY ;  Surgeon: [name], MD;  Location: [place];  Service: Orthopedics;  Laterality: Left;  ? ?Patient Active Problem List  ? Diagnosis Date Noted  ? Metatarsal fracture 09/11/2021  ? S/P lumbar spinal fusion 01/01/2017  ? ADD (attention deficit disorder) 07/17/2015  ? Bipolar affective disorder ([place]) 07/17/2015  ? HLD (hyperlipidemia) 07/17/2015  ? BP (high blood pressure) 07/17/2015  ? Obstructive apnea 07/17/2015  ?  Staphylococcus aureus infection   ? Foot osteomyelitis, right ([place]) 07/04/2015  ? ? ?PCP: Velna Hatchet, MD ? ?REFERRING PROVIDER: Lynne Leader ? ?REFERRING DIAG: R hip pain ? ?THERAPY DIAG:  ?Pain in right hip ? ?Acute right-sided low back pain without sciatica ? ?Pain in left ankle and joints of left foot ? ?ONSET DATE:  1 month ago.  ? ?SUBJECTIVE:                                                                                                                                                                                          ? ?SUBJECTIVE STATEMENT: ?Pt notes much less pain in hip and back since last session. She brought her shoe today. Reports minimal/no pain in foot . ? ? ?PERTINENT HISTORY:  ?L TKA,  Lumbar  fuston L4/5 ,  ? ?PAIN:  ?Are you having pain? Yes ?NPRS scale: 0-7 /10 ?Pain location: hip 0, back 0-2 /10 ?Pain orientation: Right  ?PAIN TYPE: aching ?Pain description: intermittent  ?Aggravating factors: sleeping,  ?Relieving factors: stretching. ? ?Are you having pain? Yes ?NPRS scale: 0-1/10 ?Pain location: L foot ?Pain orientation: Left ?PAIN TYPE: aching ?Pain description: intermittent  ?Aggravating factors:  ?Relieving factors:  ? ? ? ?PRECAUTIONS: None ? ?WEIGHT BEARING RESTRICTIONS No ? ?FALLS:  ?Has patient fallen in last 6 months? Yes, Number of falls: 1-  tripped when walking dog in April.  ? ?OCCUPATION: Retired,  ? ?PLOF: Independent ? ?PATIENT GOALS  Return to regular exercise, walking.  ? ? ?OBJECTIVE:  ?Updated 09/23/21 ?DIAGNOSTIC FINDINGS: none for hip or back.  ? ?COGNITION: ? Overall cognitive status: Within functional limits for tasks assessed  ?POSTURE:  ? ?PALPATION: ?Soreness in R glute, piriformis, into gr troch and TFL  ?  ?AROM-   Lumbar: WFL,  Hips: mild limitation for R flexion/ rotation,  L knee: lacking full flexion (from previous TKA) ;  ? ?MMT:  ?Hips: 4+/5 ;  Knees: 5/5,   ? ? ? ?TODAY'S TREATMENT  ? ?10/08/21: ?Aerobic:  ?Supine:   Bridging x 20;  Ankle ROM, PF/DF/INV/EV x 15 ea; Toe flex/ext x 15;  SLR x 15 bil;  ?S/L:  ?Seated: no shoes: sit to stand x 12; heel raises x 15 ; ?Standing:  no shoes: weight shifts L/R x 20 , shoes on: weight shifts staggered stance a/p x 10 ea bil; marching x 10;  ?Stretches:  seated piriformis hip ER  30 sec x 3 ; SKTC 30 sec x3 on R; Standing R QL stretch 30 sec x 3 . ?Manual:  ?Gait: shoes on, no AD, 45 ft x 6;  ? ? ?10/03/21: ?  Aerobic:  ?Supine:  Clams blue TB x 20; Bridging x 20;  Ankle ROM, PF/DF/INV/EV x 10 ea; Toe flex/ext x 20;  SLR x 15 bil;  ?S/L:  ?Seated:  ?Standing:   ?Stretches:  piriformis hip ER  30 sec x 3 on R supine and seated; SKTC 30 sec x3 on R; Standing R QL stretch 30 sec x 3 . ?Manual: long leg distraction bil for R hip and  lumbar; DTM and TPR to R lumbar, SI mobs.  ?Gait: 1 crutches, WBAT, 100 ft ?Trigger Point Dry-Needling  ?Treatment instructions: Expect mild to moderate muscle soreness. S/S of pneumothorax if dry needled over a lung field, and to seek immediate medical attention should they occur. Patient verbalized understanding of these instructions and education. ? ?Patient Consent Given: Yes ?Education handout provided: Previously provided ?Muscles treated: R lumbar multifidi ?Electrical stimulation performed: No ?Parameters: N/A ?Treatment response/outcome: Palpable increase in muscle length.  ? ? ?PATIENT EDUCATION:  ?Education details: updated and reviewed HEP,  ?Person educated: Patient ?Education method: Explanation, Demonstration, Tactile cues, Verbal cues, and Handouts ?Education comprehension: verbalized understanding, returned demonstration, verbal cues required, tactile cues required, and needs further education ? ? ?HOME EXERCISE PROGRAM: ?Access Code: [email or web address] ? ?ASSESSMENT: ? ?CLINICAL IMPRESSION: ?10/08/21:    ?Focus today for transition into shoe, and practicing weight bearing on L foot. Pt did well with walking without AD, mildly unsteady, but no pain in foot. She will wean out of boot, as she can tolerate.  ?Pt doing on vacation next week,  discussed continued use of boot on beach, and for longer distance walking.  ?Hip and back pain much improved, will continue to monitor.  ? ? ?OBJECTIVE IMPAIRMENTS Abnormal gait, decreased activity tolerance, decreased balance, decreased knowledge of use of DME, decreased mobility, difficulty walking, decreased ROM, decreased strength, increased muscle spasms, impaired flexibility, improper body mechanics, and pain.  ? ?ACTIVITY LIMITATIONS cleaning, community activity, meal prep, laundry, and yard work.  ? ?PERSONAL FACTORS Past/current experiences /foot fracture ; are also affecting patient's functional outcome.  ? ? ?REHAB POTENTIAL: Good ? ?CLINICAL DECISION MAKING:  Stable/uncomplicated ? ?EVALUATION COMPLEXITY: Low ? ? ?GOALS: ?Goals reviewed with patient? Yes ? ?SHORT TERM GOALS: ? ?STG Name Target Date Goal status  ?1 Patient will be independent with initial HEP  ? 5/16

## 2021-10-08 NOTE — Progress Notes (Signed)
   I, Wendy Poet, LAT, ATC, am serving as scribe for Dr. Lynne Leader.  Whitney Lewis is a 61 y.o. female who presents to [place] at [facility] today for for f/u of R hip pain due to trochanteric bursitis.  She was last seen by Dr. Georgina Snell on 09/11/21 and noted recurrence of her R hip pain after having to wear a short walking boot for a L 5th MT MT fx that occurred on 08/28/21 and is being manage by podiatry.  She ws re-referred to PT and has now completed 11 visits.  She was also prescribed a knee scooter.  Today, pt reports that her R hip is feeling much better.  She has had some dry needling at PT in her R hip and lower back.  She had a f/u w/ her podiatrist regarding her L 5th MT fx and has been cleared to begin coming out of her boot.  Pertinent review of systems: No fevers or chills  Relevant historical information: Hypertension   Exam:  BP 104/70 (BP Location: Right Arm, Patient Position: Sitting, Cuff Size: Normal)   Pulse 79   Ht '5\' 6"'$  (1.676 m)   Wt 144 lb 9.6 oz (65.6 kg)   SpO2 96%   BMI 23.34 kg/m  General: Well Developed, well nourished, and in no acute distress.   MSK: Right hip normal motion normal gait     Assessment and Plan: 61 y.o. female with trochanteric bursitis.  Improving with PT.  Plan to continue PT which should naturally be finishing here in the near future.  We will be transitioning to home exercise program per PT.  She does have a little bit of nighttime pain so I have prescribed tizanidine to use as needed.  Recheck back with me as needed.  Precautions and recheck indications reviewed.  Treatment plan and options discussed. Total encounter time 20 minutes including face-to-face time with the patient and, reviewing past medical record, and charting on the date of service.      PDMP not reviewed this encounter. No orders of the defined types were placed in this encounter.  Meds ordered this encounter  Medications   tiZANidine  (ZANAFLEX) 2 MG tablet    Sig: Take 1-2 tablets (2-4 mg total) by mouth at bedtime as needed for muscle spasms.    Dispense:  30 tablet    Refill:  1     Discussed warning signs or symptoms. Please see discharge instructions. Patient expresses understanding.   The above documentation has been reviewed and is accurate and complete Lynne Leader, M.D.

## 2021-10-09 ENCOUNTER — Ambulatory Visit (INDEPENDENT_AMBULATORY_CARE_PROVIDER_SITE_OTHER): Payer: PPO | Admitting: Family Medicine

## 2021-10-09 ENCOUNTER — Encounter: Payer: Self-pay | Admitting: Family Medicine

## 2021-10-09 VITALS — BP 104/70 | HR 79 | Ht 66.0 in | Wt 144.6 lb

## 2021-10-09 DIAGNOSIS — M7061 Trochanteric bursitis, right hip: Secondary | ICD-10-CM | POA: Diagnosis not present

## 2021-10-09 MED ORDER — TIZANIDINE HCL 2 MG PO TABS: 2.0000 mg | ORAL_TABLET | Freq: Every evening | ORAL | 1 refills | Status: DC | PRN

## 2021-10-09 NOTE — Patient Instructions (Signed)
Thank you for coming in today.   Recheck as needed.   Try the tizandine muscle relaxer at bedtime as needed.

## 2021-10-10 ENCOUNTER — Ambulatory Visit: Payer: PPO | Admitting: Family Medicine

## 2021-10-21 ENCOUNTER — Ambulatory Visit (INDEPENDENT_AMBULATORY_CARE_PROVIDER_SITE_OTHER): Payer: PPO | Admitting: Physical Therapy

## 2021-10-21 ENCOUNTER — Other Ambulatory Visit: Payer: Self-pay

## 2021-10-21 ENCOUNTER — Ambulatory Visit: Payer: PPO | Admitting: Sports Medicine

## 2021-10-21 ENCOUNTER — Ambulatory Visit (INDEPENDENT_AMBULATORY_CARE_PROVIDER_SITE_OTHER): Payer: PPO

## 2021-10-21 ENCOUNTER — Encounter: Payer: Self-pay | Admitting: Physical Therapy

## 2021-10-21 VITALS — BP 118/64 | HR 79 | Ht 66.0 in | Wt 144.0 lb

## 2021-10-21 DIAGNOSIS — M8580 Other specified disorders of bone density and structure, unspecified site: Secondary | ICD-10-CM | POA: Diagnosis not present

## 2021-10-21 DIAGNOSIS — S92355G Nondisplaced fracture of fifth metatarsal bone, left foot, subsequent encounter for fracture with delayed healing: Secondary | ICD-10-CM

## 2021-10-21 DIAGNOSIS — M25551 Pain in right hip: Secondary | ICD-10-CM

## 2021-10-21 DIAGNOSIS — M25572 Pain in left ankle and joints of left foot: Secondary | ICD-10-CM | POA: Diagnosis not present

## 2021-10-21 DIAGNOSIS — M545 Low back pain, unspecified: Secondary | ICD-10-CM

## 2021-10-21 DIAGNOSIS — M79672 Pain in left foot: Secondary | ICD-10-CM

## 2021-10-21 NOTE — Patient Instructions (Addendum)
Good to see you  Wear boot or additional 2 weeks  Then can transition into tennis shoe wearing  Goal of pain free when walking Follow up As needed , if no improvement 3-4 week follow up

## 2021-10-21 NOTE — Therapy (Signed)
OUTPATIENT PHYSICAL THERAPY TREATMENT   Patient Name: Whitney Lewis MRN: [id number] DOB:11/29/1960, 61 y.o., female Today's Date: 10/21/2021      PT End of Session - 10/21/21 1130     Visit Number 13    Date for PT Re-Evaluation 11/04/21    Authorization Type HTA   re-cert done vist 7,  and visit 8,    PT Start Time 1020    PT Stop Time 1059    PT Time Calculation (min) 39 min    Activity Tolerance Patient tolerated treatment well    Behavior During Therapy WFL for tasks assessed/performed                       Past Medical History:  Diagnosis Date   ADD (attention deficit disorder)    Anxiety    Arthritis    feet, knees (07/04/2015)   Bipolar disorder ([place])    Complication of anesthesia    hard to wake up, blood pressure drops    Depression    Family history of adverse reaction to anesthesia    "sister hard to wake up also"   Fibromyalgia dx'd ~ 2000   GERD (gastroesophageal reflux disease)    Hyperlipidemia    Hypertension    Migraine    "hormonal; stopped when I was 16" (07/04/2015)   OSA (obstructive sleep apnea)    wears mouth piece, no CPAP   Seizures ([place])    Past Surgical History:  Procedure Laterality Date   ABDOMINAL HYSTERECTOMY  ~ [place]   COLONOSCOPY     COLONOSCOPY WITH PROPOFOL N/A 09/21/2017   Procedure: COLONOSCOPY WITH PROPOFOL;  Surgeon: Lollie Sails, MD;  Location: [facility] ENDOSCOPY;  Service: Endoscopy;  Laterality: N/A;   HAMMER TOE SURGERY Right 03/2015   HAMMER TOE SURGERY Left 06/06/2015   Procedure: LEFT TWO-THREE  HAMMER TOE CORRECTION ;  Surgeon: [name], MD;  Location: [place];  Service: Orthopedics;  Laterality: Left;   INCISION AND DRAINAGE OF WOUND Right 07/04/2015   Procedure: IRRIGATION AND DEBRIDEMENT OF RIGHT FOOT DEEP ABSCESS; EXCISION OF RIGHT SECOND METATARSAL HEAD; REMOVAL OF DEEP IMPLANT RIGHT FOOT; EXCISION OF RIGHT SECOND TOE PROXIMAL PHALANX  BASE; INSERTION OF ANTIBIOTIC BEADS INTO RIGHT FOOT ABSCESS;  Surgeon: [name], MD;  Location: [place];  Service: Orthopedics;  Laterality: Right;  SITE ALSO  RIGHT FOOT   IRRIGATION AND DEBRIDEMENT FOOT Right 07/04/2015   Irrigation and excisional debridement of right foot deep abscess;; Insertion of antibiotic beads into the right foot abscess   JOINT REPLACEMENT     MAXIMUM ACCESS (MAS)POSTERIOR LUMBAR INTERBODY FUSION (PLIF) 1 LEVEL N/A 01/01/2017   Procedure: L4-5 MAXIMUM ACCESS (MAS) POSTERIOR LUMBAR INTERBODY FUSION (PLIF);  Surgeon: Eustace Moore, MD;  Location: [place];  Service: Neurosurgery;  Laterality: N/A;   TOTAL KNEE ARTHROPLASTY Left    WEIL OSTEOTOMY Left 06/06/2015   Procedure: LEFT TWO AND THREE METATARSAL WEIL OSTEOTOMY ;  Surgeon: [name], MD;  Location: [place];  Service: Orthopedics;  Laterality: Left;   Patient Active Problem List   Diagnosis Date Noted   Metatarsal fracture 09/11/2021   S/P lumbar spinal fusion 01/01/2017   ADD (attention deficit disorder) 07/17/2015   Bipolar affective disorder ([place]) 07/17/2015   HLD (hyperlipidemia) 07/17/2015   BP (high blood pressure) 07/17/2015   Obstructive apnea 07/17/2015   Staphylococcus aureus infection  Foot osteomyelitis, right ([place]) 07/04/2015    PCP: Velna Hatchet, MD  REFERRING PROVIDER: Lynne Leader  REFERRING DIAG: R hip pain  THERAPY DIAG:  Pain in right hip  Acute right-sided low back pain without sciatica  Pain in left ankle and joints of left foot  ONSET DATE:  1 month ago.   SUBJECTIVE:                                                                                                                                                                                           SUBJECTIVE STATEMENT: Pt had increased pain last week, while on vacation. She put boot back on for remainder of week. Did have x-ray this am(do not have results yet), and will see MD later today. Minimal pain  in foot today. Hip was a bit more sore when wearing boot.    PERTINENT HISTORY:  L TKA,  Lumbar fuston L4/5 ,   PAIN:  Are you having pain? Yes NPRS scale: 0-7 /10 Pain location: hip 0, back 0-2 /10 Pain orientation: Right  PAIN TYPE: aching Pain description: intermittent  Aggravating factors: sleeping,  Relieving factors: stretching.  Are you having pain? Yes NPRS scale: 0-1/10 Pain location: L foot Pain orientation: Left PAIN TYPE: aching Pain description: intermittent  Aggravating factors:  Relieving factors:     PRECAUTIONS: None  WEIGHT BEARING RESTRICTIONS No  FALLS:  Has patient fallen in last 6 months? Yes, Number of falls: 1-  tripped when walking dog in April.   OCCUPATION: Retired,   PLOF: Independent  PATIENT GOALS  Return to regular exercise, walking.    OBJECTIVE:  Updated 09/23/21 DIAGNOSTIC FINDINGS: none for hip or back.   COGNITION:  Overall cognitive status: Within functional limits for tasks assessed  POSTURE:   PALPATION: Soreness in R glute, piriformis, into gr troch and TFL    AROM-   Lumbar: WFL,  Hips: mild limitation for R flexion/ rotation,  L knee: lacking full flexion (from previous TKA) ;   MMT:  Hips: 4+/5 ;  Knees: 5/5,      TODAY'S TREATMENT  10/21/21 Aerobic:  Supine:   Ankle ROM, PF/DF x 10 ea;  INV/EV RTB  x 15 ea;  S/L: Hip abd 2x10 bil;  Seated: no shoes:  heel raises x 15 ; Standing:   shoes: weight shifts L/R  and A/P x 20 ,  Ambulation 45 ft x 6 in shoes.  Stretches:  supine piriformis hip ER  30 sec x 3 ; Manual:  Long leg distraction bil hips;  Gait:   10/08/21: Aerobic:  Supine:   Bridging x 20;  Ankle ROM, PF/DF/INV/EV x 15 ea; Toe flex/ext x 15;  SLR x 15 bil;  S/L:  Seated: no shoes: sit to stand x 12; heel raises x 15 ; Standing:  no shoes: weight shifts L/R x 20 , shoes on: weight shifts staggered stance a/p x 10 ea bil; marching x 10;  Stretches:  seated piriformis hip ER  30 sec x 3 ; SKTC 30  sec x3 on R; Standing R QL stretch 30 sec x 3 . Manual:  Gait: shoes on, no AD, 45 ft x 6;      PATIENT EDUCATION:  Education details:  reviewed HEP and plan for this week with boot and shoe   Person educated: Patient Education method: Explanation, Demonstration, Tactile cues, Verbal cues, and Handouts Education comprehension: verbalized understanding, returned demonstration, verbal cues required, tactile cues required, and needs further education   HOME EXERCISE PROGRAM: Access Code: [email or web address]  ASSESSMENT:  CLINICAL IMPRESSION: 10/21/21:  Pt with improving hip pain, mildly worsened by wearing boot this past week. Pt with minimal pain today, but will wait for xray results and MD recommendations later today. . Discussed wearing boot until she sees him later. She did very well wearing shoe today with light weight bearing activity. She did not have any pain in standing, or with palpation. Discussed airing on side of caution this week, and until she has x-ray results, but discussed likelihood of slower progression out of boot, as pain in foot allows.    OBJECTIVE IMPAIRMENTS Abnormal gait, decreased activity tolerance, decreased balance, decreased knowledge of use of DME, decreased mobility, difficulty walking, decreased ROM, decreased strength, increased muscle spasms, impaired flexibility, improper body mechanics, and pain.   ACTIVITY LIMITATIONS cleaning, community activity, meal prep, laundry, and yard work.   PERSONAL FACTORS Past/current experiences /foot fracture ; are also affecting patient's functional outcome.    REHAB POTENTIAL: Good  CLINICAL DECISION MAKING: Stable/uncomplicated  EVALUATION COMPLEXITY: Low   GOALS: Goals reviewed with patient? Yes  SHORT TERM GOALS:  STG Name Target Date Goal status  1 Patient will be independent with initial HEP   10/07/21 INITIAL              LONG TERM GOALS:  created 09/23/21  LTG Name Target Date Goal status  1 Patient will  be independent with final HEP  11/04/21 INITIAL  2 Pt to report decreased pain in R hip to be to 0-2/10 with standing activity   11/04/21 INITIAL  3 Pt to demonstrate improved strength of L  foot/ankle to be [facility] for improved ability for gait and stairs.    11/04/21 INITIAL  4 Pt to demonstrate ability for independent ambulation, for community distances, with foot , knee, hip mechanics WNL, to improve pain and ability for community activity.   11/04/21 INITIAL  5         PLAN: PT FREQUENCY: 1-2x/week  PT DURATION: 6 weeks  PLANNED INTERVENTIONS: Therapeutic exercises, Therapeutic activity, Neuro Muscular re-education, Balance training, Gait training, Patient/Family education, Joint mobilization, Stair training, DME instructions, Dry Needling, Electrical stimulation, Spinal mobilization, Moist heat, Taping, Traction, Ultrasound, Ionotophoresis '4mg'$ /ml Dexamethasone, and Manual therapy  PLAN FOR NEXT SESSION:    Lyndee Hensen, PT, DPT 11:40 AM  10/21/21

## 2021-10-21 NOTE — Progress Notes (Signed)
Whitney Lewis D.[place] Phone: [phone]   Assessment and Plan:     1. Closed nondisplaced fracture of fifth metatarsal bone of left foot with delayed healing, subsequent encounter 2. Osteopenia, unspecified location -Subacute, unchanged, subsequent visit - Intermittent healing of proximal fifth metatarsal fracture based on repeat x-ray performed today.  Patient was concerned for reinjury, however no displacement seen on x-ray.  Fracture does seem to have delayed healing with initial fracture on 08/28/21, and fracture line still apparent on today's x-ray - Restart wearing boot with goal of pain-free ambulation.  Continue boot wearing for 2 additional weeks and then transition out of boot into supportive tennis shoe if pain-free - Continue OTC calcium/vitamin D supplementation as this is likely cause of delayed healing since patient is not diabetic, does not smoke, and was compliant with treatment plan   Pertinent previous records reviewed include left foot x-ray from 10/21/2021, left foot x-ray 09/11/2021   Follow Up: As needed if no improvement or worsening of symptoms in 3 to 4 weeks.  Could consider ultrasound versus repeat x-ray at that time to check for intermittent healing   Subjective:   I, Whitney Lewis, am serving as a Education administrator for Doctor Glennon Mac  Chief Complaint: left foot pain   HPI:  09/11/21  Whitney Lewis is a 61 y.o. female who presents to [place] at [facility] today for for f/u of R hip pain due to trochanteric bursitis.  She was last seen by Dr. Georgina Snell on 09/11/21 and noted recurrence of her R hip pain after having to wear a short walking boot for a L 5th MT MT fx that occurred on 08/28/21 and is being manage by podiatry.  She ws re-referred to PT and has now completed 11 visits.  She was also prescribed a knee scooter.  Today, pt reports that her R hip is feeling much  better.  She has had some dry needling at PT in her R hip and lower back.  She had a f/u w/ her podiatrist regarding her L 5th MT fx and has been cleared to begin coming out of her boot.  10/21/21 Patient is a 61 year old female complaining of left foot pain. Patient states that she thinks she may have overdone it and any pressure that she was placing on her foot was hurting she put her boot back this has been going on for about a week she had just been released from wearing the boot full time has been back in the boot since Thursday   Relevant Historical Information: Osteopenia  Additional pertinent review of systems negative.   Current Outpatient Medications:    amphetamine-dextroamphetamine (ADDERALL) 30 MG tablet, Take 30 mg by mouth 3 (three) times daily as needed. , Disp: , Rfl:    Multiple Vitamin (MULTIVITAMIN WITH MINERALS) TABS tablet, Take 1 tablet by mouth every other day. , Disp: , Rfl:    tiZANidine (ZANAFLEX) 2 MG tablet, Take 1-2 tablets (2-4 mg total) by mouth at bedtime as needed for muscle spasms., Disp: 30 tablet, Rfl: 1   Objective:     Vitals:   10/21/21 1513  BP: 118/64  Pulse: 79  SpO2: 97%  Weight: 144 lb (65.3 kg)  Height: '5\' 6"'$  (1.676 m)      Body mass index is 23.24 kg/m.    Physical Exam:    Gen: Appears well, nad, nontoxic and pleasant Psych: Alert  and oriented, appropriate mood and affect Neuro: sensation intact, strength is 5/5 with df/pf/inv/ev, muscle tone wnl Skin: no susupicious lesions or rashes  Left foot/ankle: no deformity, no swelling or effusion TTP fifth metatarsal base NTTP over fibular head, lat mal, medial mal, achilles, navicular,  ATFL, CFL, deltoid, calcaneous or midfoot ROM DF 30, PF 45, inv/ev intact     Electronically signed by:  Whitney Lewis D.[name] 4:33 PM 10/21/21

## 2021-10-23 ENCOUNTER — Encounter: Payer: Self-pay | Admitting: Physical Therapy

## 2021-10-23 ENCOUNTER — Ambulatory Visit: Payer: PPO | Admitting: Physical Therapy

## 2021-10-23 DIAGNOSIS — M545 Low back pain, unspecified: Secondary | ICD-10-CM

## 2021-10-23 DIAGNOSIS — M25551 Pain in right hip: Secondary | ICD-10-CM | POA: Diagnosis not present

## 2021-10-23 DIAGNOSIS — M25572 Pain in left ankle and joints of left foot: Secondary | ICD-10-CM

## 2021-10-23 NOTE — Therapy (Signed)
OUTPATIENT PHYSICAL THERAPY TREATMENT   Patient Name: Whitney Lewis MRN: [id number] DOB:[birth year], 61 y.o., female Today's Date: 10/23/2021      PT End of Session - 10/23/21 1018     Visit Number 14    Date for PT Re-Evaluation 11/04/21    Authorization Type HTA   re-cert done vist 7,  and visit 8,    PT Start Time 1018    PT Stop Time 1053    PT Time Calculation (min) 35 min    Activity Tolerance Patient tolerated treatment well    Behavior During Therapy WFL for tasks assessed/performed                       Past Medical History:  Diagnosis Date   ADD (attention deficit disorder)    Anxiety    Arthritis    feet, knees (07/04/2015)   Bipolar disorder ([place])    Complication of anesthesia    hard to wake up, blood pressure drops    Depression    Family history of adverse reaction to anesthesia    "sister hard to wake up also"   Fibromyalgia dx'd ~ 2000   GERD (gastroesophageal reflux disease)    Hyperlipidemia    Hypertension    Migraine    "hormonal; stopped when I was 16" (07/04/2015)   OSA (obstructive sleep apnea)    wears mouth piece, no CPAP   Seizures ([facility])    Past Surgical History:  Procedure Laterality Date   ABDOMINAL HYSTERECTOMY  ~ [place]   COLONOSCOPY     COLONOSCOPY WITH PROPOFOL N/A 09/21/2017   Procedure: COLONOSCOPY WITH PROPOFOL;  Surgeon: Lollie Sails, MD;  Location: [facility] ENDOSCOPY;  Service: Endoscopy;  Laterality: N/A;   HAMMER TOE SURGERY Right 03/2015   HAMMER TOE SURGERY Left 06/06/2015   Procedure: LEFT TWO-THREE  HAMMER TOE CORRECTION ;  Surgeon: [name], MD;  Location: [place];  Service: Orthopedics;  Laterality: Left;   INCISION AND DRAINAGE OF WOUND Right 07/04/2015   Procedure: IRRIGATION AND DEBRIDEMENT OF RIGHT FOOT DEEP ABSCESS; EXCISION OF RIGHT SECOND METATARSAL HEAD; REMOVAL OF DEEP IMPLANT RIGHT FOOT; EXCISION OF RIGHT SECOND TOE PROXIMAL PHALANX  BASE; INSERTION OF ANTIBIOTIC BEADS INTO RIGHT FOOT ABSCESS;  Surgeon: [name], MD;  Location: [place];  Service: Orthopedics;  Laterality: Right;  SITE ALSO  RIGHT FOOT   IRRIGATION AND DEBRIDEMENT FOOT Right 07/04/2015   Irrigation and excisional debridement of right foot deep abscess;; Insertion of antibiotic beads into the right foot abscess   JOINT REPLACEMENT     MAXIMUM ACCESS (MAS)POSTERIOR LUMBAR INTERBODY FUSION (PLIF) 1 LEVEL N/A 01/01/2017   Procedure: L4-5 MAXIMUM ACCESS (MAS) POSTERIOR LUMBAR INTERBODY FUSION (PLIF);  Surgeon: Eustace Moore, MD;  Location: [place];  Service: Neurosurgery;  Laterality: N/A;   TOTAL KNEE ARTHROPLASTY Left    WEIL OSTEOTOMY Left 06/06/2015   Procedure: LEFT TWO AND THREE METATARSAL WEIL OSTEOTOMY ;  Surgeon: [name], MD;  Location: [place];  Service: Orthopedics;  Laterality: Left;   Patient Active Problem List   Diagnosis Date Noted   Metatarsal fracture 09/11/2021   S/P lumbar spinal fusion 01/01/2017   ADD (attention deficit disorder) 07/17/2015   Bipolar affective disorder ([place]) 07/17/2015   HLD (hyperlipidemia) 07/17/2015   BP (high blood pressure) 07/17/2015   Obstructive apnea 07/17/2015   Staphylococcus aureus infection  Foot osteomyelitis, right ([place]) 07/04/2015    PCP: Velna Hatchet, MD  REFERRING PROVIDER: Lynne Leader  REFERRING DIAG: R hip pain  THERAPY DIAG:  Pain in right hip  Acute right-sided low back pain without sciatica  Pain in left ankle and joints of left foot  ONSET DATE:  1 month ago.   SUBJECTIVE:                                                                                                                                                                                           SUBJECTIVE STATEMENT: Pt saw MD Tuesday, stable fracture, but states delayed healing. She will continue to wear boot at home for another 2 weeks, then transition back to shoe. States no pain in foot. Hip doing  ok, still tight at night some.    PERTINENT HISTORY:  L TKA,  Lumbar fuston L4/5 ,   PAIN:  Are you having pain? Yes NPRS scale: 0-7 /10 Pain location: hip 0, back 0-2 /10 Pain orientation: Right  PAIN TYPE: aching Pain description: intermittent  Aggravating factors: sleeping,  Relieving factors: stretching.  Are you having pain? Yes NPRS scale: 0-1/10 Pain location: L foot Pain orientation: Left PAIN TYPE: aching Pain description: intermittent  Aggravating factors:  Relieving factors:     PRECAUTIONS: None  WEIGHT BEARING RESTRICTIONS No  FALLS:  Has patient fallen in last 6 months? Yes, Number of falls: 1-  tripped when walking dog in April.   OCCUPATION: Retired,   PLOF: Independent  PATIENT GOALS  Return to regular exercise, walking.    OBJECTIVE:  Updated 09/23/21 DIAGNOSTIC FINDINGS: none for hip or back.   COGNITION:  Overall cognitive status: Within functional limits for tasks assessed  POSTURE:   PALPATION: Soreness in R glute, piriformis, into gr troch and TFL    AROM-   Lumbar: WFL,  Hips: mild limitation for R flexion/ rotation,  L knee: lacking full flexion (from previous TKA) ;   MMT:  Hips: 4+/5 ;  Knees: 5/5,      TODAY'S TREATMENT   10/23/21: Aerobic:  Supine:  Ankle: PF/DF/INV/EV RTB  x 15 ea; Bridging x 20;  S/L: Hip abd 2x10 bil;  Seated: no shoes:  heel raises x 15 ; Standing:   shoes: weight shifts L/R  and A/P x 20 ,Tandem stance with head turns x 30 sec bil; Marching x20 no UE support;   Stretches:  supine piriformis hip ER  30 sec x 3 ; gastroc 30 sec x 3 at wall;  Manual:     10/21/21 Aerobic:  Supine:   Ankle ROM, PF/DF x 10 ea;  INV/EV RTB  x 15 ea;  S/L: Hip abd 2x10 bil;  Seated: no shoes:  heel raises x 15 ; Standing:   shoes: weight shifts L/R  and A/P x 20 ,  Ambulation 45 ft x 6 in shoes.  Stretches:  supine piriformis hip ER  30 sec x 3 ; Manual:  Long leg distraction bil hips;  Gait:     PATIENT  EDUCATION:  Education details:  reviewed HEP  Person educated: Patient Education method: Explanation, Demonstration, Tactile cues, Verbal cues, and Handouts Education comprehension: verbalized understanding, returned demonstration, verbal cues required, tactile cues required, and needs further education   HOME EXERCISE PROGRAM: Access Code: [email or web address]  ASSESSMENT:  CLINICAL IMPRESSION: 10/23/21:   Pt doing well this week, no pain in foot. She will continue to wear boot for 2 weeks, per MD. We will hold on PT for next week, and resume following week, for trial for transition out of boot again. Pt able to do activities today without pain or difficulty.    OBJECTIVE IMPAIRMENTS Abnormal gait, decreased activity tolerance, decreased balance, decreased knowledge of use of DME, decreased mobility, difficulty walking, decreased ROM, decreased strength, increased muscle spasms, impaired flexibility, improper body mechanics, and pain.   ACTIVITY LIMITATIONS cleaning, community activity, meal prep, laundry, and yard work.   PERSONAL FACTORS Past/current experiences /foot fracture ; are also affecting patient's functional outcome.    REHAB POTENTIAL: Good  CLINICAL DECISION MAKING: Stable/uncomplicated  EVALUATION COMPLEXITY: Low   GOALS: Goals reviewed with patient? Yes  SHORT TERM GOALS:  STG Name Target Date Goal status  1 Patient will be independent with initial HEP   10/07/21 INITIAL              LONG TERM GOALS:  created 09/23/21  LTG Name Target Date Goal status  1 Patient will be independent with final HEP  11/04/21 INITIAL  2 Pt to report decreased pain in R hip to be to 0-2/10 with standing activity   11/04/21 INITIAL  3 Pt to demonstrate improved strength of L  foot/ankle to be [facility] for improved ability for gait and stairs.    11/04/21 INITIAL  4 Pt to demonstrate ability for independent ambulation, for community distances, with foot , knee, hip mechanics WNL, to improve pain  and ability for community activity.   11/04/21 INITIAL  5         PLAN: PT FREQUENCY: 1-2x/week  PT DURATION: 6 weeks  PLANNED INTERVENTIONS: Therapeutic exercises, Therapeutic activity, Neuro Muscular re-education, Balance training, Gait training, Patient/Family education, Joint mobilization, Stair training, DME instructions, Dry Needling, Electrical stimulation, Spinal mobilization, Moist heat, Taping, Traction, Ultrasound, Ionotophoresis '4mg'$ /ml Dexamethasone, and Manual therapy  PLAN FOR NEXT SESSION:    Lyndee Hensen, PT, DPT 11:01 AM  10/23/21

## 2021-10-28 ENCOUNTER — Encounter: Payer: PPO | Admitting: Physical Therapy

## 2021-10-30 ENCOUNTER — Encounter: Payer: PPO | Admitting: Physical Therapy

## 2021-11-04 ENCOUNTER — Encounter: Payer: PPO | Admitting: Physical Therapy

## 2021-11-05 ENCOUNTER — Encounter: Payer: PPO | Admitting: Physical Therapy

## 2021-11-18 ENCOUNTER — Ambulatory Visit (INDEPENDENT_AMBULATORY_CARE_PROVIDER_SITE_OTHER): Payer: PPO | Admitting: Physical Therapy

## 2021-11-18 ENCOUNTER — Other Ambulatory Visit (HOSPITAL_COMMUNITY): Payer: Self-pay | Admitting: Adult Health

## 2021-11-18 ENCOUNTER — Encounter: Payer: Self-pay | Admitting: Physical Therapy

## 2021-11-18 ENCOUNTER — Ambulatory Visit (HOSPITAL_COMMUNITY)
Admission: RE | Admit: 2021-11-18 | Discharge: 2021-11-18 | Disposition: A | Discharge status: Home / Self Care | Payer: PPO | Source: Ambulatory Visit | Attending: Adult Health | Admitting: Adult Health

## 2021-11-18 DIAGNOSIS — M545 Low back pain, unspecified: Secondary | ICD-10-CM | POA: Diagnosis not present

## 2021-11-18 DIAGNOSIS — M25572 Pain in left ankle and joints of left foot: Secondary | ICD-10-CM

## 2021-11-18 DIAGNOSIS — M25551 Pain in right hip: Secondary | ICD-10-CM | POA: Diagnosis not present

## 2021-11-18 DIAGNOSIS — G8929 Other chronic pain: Secondary | ICD-10-CM

## 2021-11-18 DIAGNOSIS — R600 Localized edema: Secondary | ICD-10-CM

## 2021-12-01 ENCOUNTER — Encounter: Payer: PPO | Admitting: Physical Therapy

## 2021-12-05 ENCOUNTER — Encounter: Payer: PPO | Admitting: Physical Therapy

## 2021-12-11 ENCOUNTER — Ambulatory Visit: Payer: PPO | Admitting: Physical Therapy

## 2021-12-11 ENCOUNTER — Encounter: Payer: Self-pay | Admitting: Physical Therapy

## 2021-12-11 DIAGNOSIS — G8929 Other chronic pain: Secondary | ICD-10-CM | POA: Diagnosis not present

## 2021-12-11 DIAGNOSIS — M25572 Pain in left ankle and joints of left foot: Secondary | ICD-10-CM | POA: Diagnosis not present

## 2021-12-11 DIAGNOSIS — M545 Low back pain, unspecified: Secondary | ICD-10-CM | POA: Diagnosis not present

## 2021-12-11 DIAGNOSIS — M25551 Pain in right hip: Secondary | ICD-10-CM

## 2021-12-11 NOTE — Therapy (Signed)
OUTPATIENT PHYSICAL THERAPY TREATMENT   Patient Name: Whitney Lewis MRN: [id number] DOB:1960/07/14, 61 y.o., female Today's Date: 12/11/2021      PT End of Session - 12/11/21 1419     Visit Number 16    Date for PT Re-Evaluation 12/30/21    Authorization Type HTA   re-cert done vist 7,  and visit 8,  and visit 15.    PT Start Time 0806    PT Stop Time 0846    PT Time Calculation (min) 40 min    Activity Tolerance Patient tolerated treatment well    Behavior During Therapy [facility] for tasks assessed/performed                         Past Medical History:  Diagnosis Date   ADD (attention deficit disorder)    Anxiety    Arthritis    feet, knees (07/04/2015)   Bipolar disorder ([place])    Complication of anesthesia    hard to wake up, blood pressure drops    Depression    Family history of adverse reaction to anesthesia    "sister hard to wake up also"   Fibromyalgia dx'd ~ 2000   GERD (gastroesophageal reflux disease)    Hyperlipidemia    Hypertension    Migraine    "hormonal; stopped when I was 16" (07/04/2015)   OSA (obstructive sleep apnea)    wears mouth piece, no CPAP   Seizures ([place])    Past Surgical History:  Procedure Laterality Date   ABDOMINAL HYSTERECTOMY  ~ [place]   COLONOSCOPY     COLONOSCOPY WITH PROPOFOL N/A 09/21/2017   Procedure: COLONOSCOPY WITH PROPOFOL;  Surgeon: Lollie Sails, MD;  [facility]: [facility] ENDOSCOPY;  Service: Endoscopy;  Laterality: N/A;   HAMMER TOE SURGERY Right 03/2015   HAMMER TOE SURGERY Left 06/06/2015   Procedure: LEFT TWO-THREE  HAMMER TOE CORRECTION ;  Surgeon: [name], MD;  Location: [place];  Service: Orthopedics;  Laterality: Left;   INCISION AND DRAINAGE OF WOUND Right 07/04/2015   Procedure: IRRIGATION AND DEBRIDEMENT OF RIGHT FOOT DEEP ABSCESS; EXCISION OF RIGHT SECOND METATARSAL HEAD; REMOVAL OF DEEP IMPLANT RIGHT FOOT; EXCISION OF RIGHT SECOND  TOE PROXIMAL PHALANX BASE; INSERTION OF ANTIBIOTIC BEADS INTO RIGHT FOOT ABSCESS;  Surgeon: [name], MD;  Location: [place];  Service: Orthopedics;  Laterality: Right;  SITE ALSO  RIGHT FOOT   IRRIGATION AND DEBRIDEMENT FOOT Right 07/04/2015   Irrigation and excisional debridement of right foot deep abscess;; Insertion of antibiotic beads into the right foot abscess   JOINT REPLACEMENT     MAXIMUM ACCESS (MAS)POSTERIOR LUMBAR INTERBODY FUSION (PLIF) 1 LEVEL N/A 01/01/2017   Procedure: L4-5 MAXIMUM ACCESS (MAS) POSTERIOR LUMBAR INTERBODY FUSION (PLIF);  Surgeon: Eustace Moore, MD;  Location: [place];  Service: Neurosurgery;  Laterality: N/A;   TOTAL KNEE ARTHROPLASTY Left    WEIL OSTEOTOMY Left 06/06/2015   Procedure: LEFT TWO AND THREE METATARSAL WEIL OSTEOTOMY ;  Surgeon: [name], MD;  Location: [place];  Service: Orthopedics;  Laterality: Left;   Patient Active Problem List   Diagnosis Date Noted   Metatarsal fracture 09/11/2021   S/P lumbar spinal fusion 01/01/2017   ADD (attention deficit disorder) 07/17/2015   Bipolar affective disorder ([place]) 07/17/2015   HLD (hyperlipidemia) 07/17/2015   BP (high blood pressure) 07/17/2015   Obstructive apnea 07/17/2015  Staphylococcus aureus infection    Foot osteomyelitis, right ([place]) 07/04/2015    PCP: Velna Hatchet, MD  REFERRING PROVIDER: Lynne Leader  REFERRING DIAG: R hip pain  THERAPY DIAG:  Pain in right hip  Chronic right-sided low back pain without sciatica  Pain in left ankle and joints of left foot  ONSET DATE:  1 month ago.   SUBJECTIVE:                                                                                                                                                                                           SUBJECTIVE STATEMENT: 12/11/2021 Pt was away for 2 weeks. She has been working hard on HEP. Notes much pain in L hip, and much muscle fatigue in L calf and lower leg. Has been Out of  boot for 3 weeks, with No pain in foot.    PERTINENT HISTORY:  L TKA,  Lumbar fuston L4/5 ,   PAIN:  Are you having pain? Yes NPRS scale: 6-8 /10 Pain location: L hip ,  Pain orientation: Left  PAIN TYPE: aching Pain description: intermittent  Aggravating factors: increased activity, sleeping (up to 8/10)  Relieving factors: none stated.   Are you having pain? Yes NPRS scale: 0-1/10 Pain location: L foot Pain orientation: Left PAIN TYPE: aching Pain description: intermittent  Aggravating factors:  Relieving factors:     PRECAUTIONS: None  WEIGHT BEARING RESTRICTIONS No  FALLS:  Has patient fallen in last 6 months? Yes, Number of falls: 1-  tripped when walking dog in April.   OCCUPATION: Retired,   PLOF: Independent  PATIENT GOALS  Return to regular exercise, walking.    OBJECTIVE:  Updated 11/18/21 DIAGNOSTIC FINDINGS: none for hip or back.   COGNITION:  Overall cognitive status: Within functional limits for tasks assessed  POSTURE:   PALPATION: Much improved Soreness in R glute, piriformis,   AROM-   Lumbar: WFL,  Hips: mild limitation for R flexion/ rotation,  L knee: mild- lacking full flexion (from previous TKA) ;   MMT:  Hips: 4+/5 ;  Knees: 5/5,      TODAY'S TREATMENT   12/11/21: Aerobic:  Supine:   Bridging x 20;  S/L: Hip abd 2x10 bil;  Seated:  Standing:  reviewed SLS 20 sec x 3 bil;  Step ups 6 in x 10 bil;  Stretches:  supine piriformis hip IR and ER  30 sec x 3; Standing hip flexor stretch 30 sec x 3 bil;  Manual:  DTM/TPR to R glute, piriformis.  Modalities:  Most heat pack , L hip x 10 min after session.   Trigger Point  Dry-Needling  Treatment instructions: Expect mild to moderate muscle soreness. S/S of pneumothorax if dry needled over a lung field, and to seek immediate medical attention should they occur. Patient verbalized understanding of these instructions and education.  Patient Consent Given: Yes Education handout  provided: Previously provided Muscles treated: L piriformis Electrical stimulation performed: No Parameters: N/A Treatment response/outcome: Twitch response, palpable increase in muscle length.      PATIENT EDUCATION:  Education details:  reviewed HEP  Person educated: Patient Education method: Explanation, Demonstration, Tactile cues, Verbal cues, and Handouts Education comprehension: verbalized understanding, returned demonstration, verbal cues required, tactile cues required, and needs further education   HOME EXERCISE PROGRAM: Access Code: [email or web address]  ASSESSMENT:  CLINICAL IMPRESSION: 12/11/21:  Pt doing well with pain in foot, has been able to be without boot for a couple weeks. She is having increased muscle fatigue in lower leg and calf, as well as increased pain in L hip/glute. Noted muscle tightness and trigger points in glute, addressed with manual and dry needling today, pt with good tolerance. Discussed continuing HEP as able, with more stretching for hip, as well as single leg stability for L foot. Plan to progress as able, will add more single leg strength and stability for L leg, which may help muscle fatigue. Pt does have postural asymmetries which may be effecting work/fatigue of L calf, but are likely corrected to max potential with current heel lift.    OBJECTIVE IMPAIRMENTS Abnormal gait, decreased activity tolerance, decreased balance, decreased knowledge of use of DME, decreased mobility, difficulty walking, decreased ROM, decreased strength, increased muscle spasms, impaired flexibility, improper body mechanics, and pain.   ACTIVITY LIMITATIONS cleaning, community activity, meal prep, laundry, and yard work.   PERSONAL FACTORS Past/current experiences /foot fracture ; are also affecting patient's functional outcome.    REHAB POTENTIAL: Good  CLINICAL DECISION MAKING: Stable/uncomplicated  EVALUATION COMPLEXITY: Low   GOALS: Goals reviewed with patient?  Yes  SHORT TERM GOALS:  STG Name Target Date Goal status  1 Patient will be independent with initial HEP   10/07/21 MET              LONG TERM GOALS:  created 09/23/21  LTG Name Target Date Goal status  1 Patient will be independent with final HEP  12/30/21 Partially MET  2 Pt to report decreased pain in R hip to be to 0-2/10 with standing activity   12/30/21 Partially MET  3 Pt to demonstrate improved strength of L  foot/ankle to be [facility] for improved ability for gait and stairs.    12/30/21 Partially MET  4 Pt to demonstrate ability for independent ambulation, for community distances, with foot , knee, hip mechanics WNL, to improve pain and ability for community activity.   12/30/21 Partially MET  5         PLAN: PT FREQUENCY: 1-2x/week  PT DURATION: 6 weeks  PLANNED INTERVENTIONS: Therapeutic exercises, Therapeutic activity, Neuro Muscular re-education, Balance training, Gait training, Patient/Family education, Joint mobilization, Stair training, DME instructions, Dry Needling, Electrical stimulation, Spinal mobilization, Moist heat, Taping, Traction, Ultrasound, Ionotophoresis 3m/ml Dexamethasone, and Manual therapy  PLAN FOR NEXT SESSION:    LLyndee Hensen PT, DPT 2:20 PM  12/11/21

## 2021-12-18 ENCOUNTER — Encounter: Payer: PPO | Admitting: Physical Therapy

## 2021-12-22 ENCOUNTER — Encounter: Payer: Self-pay | Admitting: Physical Therapy

## 2021-12-22 ENCOUNTER — Ambulatory Visit: Payer: PPO | Admitting: Physical Therapy

## 2021-12-22 DIAGNOSIS — M25551 Pain in right hip: Secondary | ICD-10-CM | POA: Diagnosis not present

## 2021-12-22 DIAGNOSIS — M25572 Pain in left ankle and joints of left foot: Secondary | ICD-10-CM | POA: Diagnosis not present

## 2021-12-22 DIAGNOSIS — M545 Low back pain, unspecified: Secondary | ICD-10-CM

## 2021-12-22 DIAGNOSIS — G8929 Other chronic pain: Secondary | ICD-10-CM

## 2021-12-22 NOTE — Therapy (Signed)
OUTPATIENT PHYSICAL THERAPY TREATMENT   Patient Name: Whitney Lewis MRN: [id number] DOB:09-Mar-1961, 61 y.o., female Today's Date: 12/22/2021      PT End of Session - 12/22/21 2114     Visit Number 17    Date for PT Re-Evaluation 12/30/21    Authorization Type HTA   re-cert done vist 7,  and visit 8,  and visit 15.    PT Start Time [phone]    PT Stop Time 0930    PT Time Calculation (min) 38 min    Activity Tolerance Patient tolerated treatment well    Behavior During Therapy WFL for tasks assessed/performed                          Past Medical History:  Diagnosis Date   ADD (attention deficit disorder)    Anxiety    Arthritis    feet, knees (07/04/2015)   Bipolar disorder ([place])    Complication of anesthesia    hard to wake up, blood pressure drops    Depression    Family history of adverse reaction to anesthesia    "sister hard to wake up also"   Fibromyalgia dx'd ~ 2000   GERD (gastroesophageal reflux disease)    Hyperlipidemia    Hypertension    Migraine    "hormonal; stopped when I was 16" (07/04/2015)   OSA (obstructive sleep apnea)    wears mouth piece, no CPAP   Seizures ([place])    Past Surgical History:  Procedure Laterality Date   ABDOMINAL HYSTERECTOMY  ~ [place]   COLONOSCOPY     COLONOSCOPY WITH PROPOFOL N/A 09/21/2017   Procedure: COLONOSCOPY WITH PROPOFOL;  Surgeon: Lollie Sails, MD;  [facility] ENDOSCOPY;  Service: Endoscopy;  Laterality: N/A;   HAMMER TOE SURGERY Right 03/2015   HAMMER TOE SURGERY Left 06/06/2015   Procedure: LEFT TWO-THREE  HAMMER TOE CORRECTION ;  Surgeon: [name], MD;  Location: [place];  Service: Orthopedics;  Laterality: Left;   INCISION AND DRAINAGE OF WOUND Right 07/04/2015   Procedure: IRRIGATION AND DEBRIDEMENT OF RIGHT FOOT DEEP ABSCESS; EXCISION OF RIGHT SECOND METATARSAL HEAD; REMOVAL OF DEEP IMPLANT RIGHT FOOT; EXCISION OF RIGHT SECOND  TOE PROXIMAL PHALANX BASE; INSERTION OF ANTIBIOTIC BEADS INTO RIGHT FOOT ABSCESS;  Surgeon: [name], MD;  Location: [place];  Service: Orthopedics;  Laterality: Right;  SITE ALSO  RIGHT FOOT   IRRIGATION AND DEBRIDEMENT FOOT Right 07/04/2015   Irrigation and excisional debridement of right foot deep abscess;; Insertion of antibiotic beads into the right foot abscess   JOINT REPLACEMENT     MAXIMUM ACCESS (MAS)POSTERIOR LUMBAR INTERBODY FUSION (PLIF) 1 LEVEL N/A 01/01/2017   Procedure: L4-5 MAXIMUM ACCESS (MAS) POSTERIOR LUMBAR INTERBODY FUSION (PLIF);  Surgeon: Eustace Moore, MD;  Location: [place];  Service: Neurosurgery;  Laterality: N/A;   TOTAL KNEE ARTHROPLASTY Left    WEIL OSTEOTOMY Left 06/06/2015   Procedure: LEFT TWO AND THREE METATARSAL WEIL OSTEOTOMY ;  Surgeon: [name], MD;  Location: [place];  Service: Orthopedics;  Laterality: Left;   Patient Active Problem List   Diagnosis Date Noted   Metatarsal fracture 09/11/2021   S/P lumbar spinal fusion 01/01/2017   ADD (attention deficit disorder) 07/17/2015   Bipolar affective disorder ([place]) 07/17/2015   HLD (hyperlipidemia) 07/17/2015   BP (high blood pressure) 07/17/2015   Obstructive apnea 07/17/2015  Staphylococcus aureus infection    Foot osteomyelitis, right ([place]) 07/04/2015    PCP: Velna Hatchet, MD  REFERRING PROVIDER: Lynne Leader  REFERRING DIAG: R hip pain  THERAPY DIAG:  Pain in right hip  Chronic right-sided low back pain without sciatica  Pain in left ankle and joints of left foot  ONSET DATE:  1 month ago.   SUBJECTIVE:                                                                                                                                                                                           SUBJECTIVE STATEMENT: 12/22/2021 Pt states much less pain in hips since last visit. R low back still sore. Fatigue in L lower leg is less, but feels weakness in both. Has been working on  [employer].    PERTINENT HISTORY:  L TKA,  Lumbar fuston L4/5 ,   PAIN:  Are you having pain? Yes NPRS scale: 0-4 /10 Pain location: L hip ,  Pain orientation: Left  PAIN TYPE: aching Pain description: intermittent  Aggravating factors: increased activity, sleeping (up to 8/10)  Relieving factors: none stated.   Are you having pain? Yes NPRS scale: 0-1/10 Pain location: L foot Pain orientation: Left PAIN TYPE: aching Pain description: intermittent  Aggravating factors:  Relieving factors:     PRECAUTIONS: None  WEIGHT BEARING RESTRICTIONS No  FALLS:  Has patient fallen in last 6 months? Yes, Number of falls: 1-  tripped when walking dog in April.   OCCUPATION: Retired,   PLOF: Independent  PATIENT GOALS  Return to regular exercise, walking.    OBJECTIVE:  Updated 11/18/21 DIAGNOSTIC FINDINGS: none for hip or back.   COGNITION:  Overall cognitive status: Within functional limits for tasks assessed  POSTURE:   PALPATION: Much improved Soreness in R glute, piriformis,   AROM-   Lumbar: WFL,  Hips: mild limitation for R flexion/ rotation,  L knee: mild- lacking full flexion (from previous TKA) ;   MMT:  Hips: 4+/5 ;  Knees: 5/5,      TODAY'S TREATMENT   12/22/21: Aerobic:  Supine:   Bridging x 20;  S/L: Hip abd 2x10 bil;  Seated:  Standing:  Hip abd 2x10 bil; slow march x 20; HR x 15 (Double leg) SL ecc HR x 5 bil;  SLS 30 sec x 3 bil;  Stretches:  supine piriformis 30 sec x 3; Standing hip flexor stretch 30 sec x 3 bil; standing lumbar SB/QL stretch 30 sec x 2 bil;  Manual:   long leg distraction on R for lumbar pump;  Modalities:      PATIENT EDUCATION:  Education details:  reviewed HEP  Person educated: Patient Education method: Explanation, Demonstration, Tactile cues, Verbal cues, and Handouts Education comprehension: verbalized understanding, returned demonstration, verbal cues required, tactile cues required, and needs further education   HOME  EXERCISE PROGRAM: Access Code: [email or web address]  ASSESSMENT:  CLINICAL IMPRESSION: 12/22/21:  Pt with decreased hip pain since last visit. She is doing well with HEP and tolerance for ther ex. She did well today with more single leg activity, plan to progress strength as tolerated. She does have pain in R low back/SI region at times with increased standing. Will benefit from continued care.   OBJECTIVE IMPAIRMENTS Abnormal gait, decreased activity tolerance, decreased balance, decreased knowledge of use of DME, decreased mobility, difficulty walking, decreased ROM, decreased strength, increased muscle spasms, impaired flexibility, improper body mechanics, and pain.   ACTIVITY LIMITATIONS cleaning, community activity, meal prep, laundry, and yard work.   PERSONAL FACTORS Past/current experiences /foot fracture ; are also affecting patient's functional outcome.    REHAB POTENTIAL: Good  CLINICAL DECISION MAKING: Stable/uncomplicated  EVALUATION COMPLEXITY: Low   GOALS: Goals reviewed with patient? Yes  SHORT TERM GOALS:  STG Name Target Date Goal status  1 Patient will be independent with initial HEP   10/07/21 MET              LONG TERM GOALS:  created 09/23/21  LTG Name Target Date Goal status  1 Patient will be independent with final HEP  12/30/21 Partially MET  2 Pt to report decreased pain in R hip to be to 0-2/10 with standing activity   12/30/21 Partially MET  3 Pt to demonstrate improved strength of L  foot/ankle to be [facility] for improved ability for gait and stairs.    12/30/21 Partially MET  4 Pt to demonstrate ability for independent ambulation, for community distances, with foot , knee, hip mechanics WNL, to improve pain and ability for community activity.   12/30/21 Partially MET  5         PLAN: PT FREQUENCY: 1-2x/week  PT DURATION: 6 weeks  PLANNED INTERVENTIONS: Therapeutic exercises, Therapeutic activity, Neuro Muscular re-education, Balance training, Gait training,  Patient/Family education, Joint mobilization, Stair training, DME instructions, Dry Needling, Electrical stimulation, Spinal mobilization, Moist heat, Taping, Traction, Ultrasound, Ionotophoresis 78m/ml Dexamethasone, and Manual therapy  PLAN FOR NEXT SESSION:    LLyndee Hensen PT, DPT 9:15 PM  12/22/21

## 2021-12-25 ENCOUNTER — Encounter: Payer: Self-pay | Admitting: Physical Therapy

## 2021-12-25 ENCOUNTER — Ambulatory Visit: Payer: PPO | Admitting: Physical Therapy

## 2021-12-25 DIAGNOSIS — M545 Low back pain, unspecified: Secondary | ICD-10-CM | POA: Diagnosis not present

## 2021-12-25 DIAGNOSIS — M25551 Pain in right hip: Secondary | ICD-10-CM

## 2021-12-25 DIAGNOSIS — M25572 Pain in left ankle and joints of left foot: Secondary | ICD-10-CM | POA: Diagnosis not present

## 2021-12-25 DIAGNOSIS — G8929 Other chronic pain: Secondary | ICD-10-CM

## 2021-12-25 NOTE — Therapy (Signed)
OUTPATIENT PHYSICAL THERAPY TREATMENT   Patient Name: Whitney Lewis MRN: [id number] DOB:[birth year], 61 y.o., female Today's Date: 12/25/2021      PT End of Session - 12/25/21 2142     Visit Number 18    Date for PT Re-Evaluation 12/30/21    Authorization Type HTA   re-cert done vist 7,  and visit 8,  and visit 15.    PT Start Time 0808    PT Stop Time 0850    PT Time Calculation (min) 42 min    Activity Tolerance Patient tolerated treatment well    Behavior During Therapy [facility] for tasks assessed/performed                           Past Medical History:  Diagnosis Date   ADD (attention deficit disorder)    Anxiety    Arthritis    feet, knees (07/04/2015)   Bipolar disorder ([place])    Complication of anesthesia    hard to wake up, blood pressure drops    Depression    Family history of adverse reaction to anesthesia    "sister hard to wake up also"   Fibromyalgia dx'd ~ 2000   GERD (gastroesophageal reflux disease)    Hyperlipidemia    Hypertension    Migraine    "hormonal; stopped when I was 16" (07/04/2015)   OSA (obstructive sleep apnea)    wears mouth piece, no CPAP   Seizures ([place])    Past Surgical History:  Procedure Laterality Date   ABDOMINAL HYSTERECTOMY  ~ [place]   COLONOSCOPY     COLONOSCOPY WITH PROPOFOL N/A 09/21/2017   Procedure: COLONOSCOPY WITH PROPOFOL;  Surgeon: Lollie Sails, MD;  Location: [facility] ENDOSCOPY;  Service: Endoscopy;  Laterality: N/A;   HAMMER TOE SURGERY Right 03/2015   HAMMER TOE SURGERY Left 06/06/2015   Procedure: LEFT TWO-THREE  HAMMER TOE CORRECTION ;  Surgeon: [name], MD;  Location: [place];  Service: Orthopedics;  Laterality: Left;   INCISION AND DRAINAGE OF WOUND Right 07/04/2015   Procedure: IRRIGATION AND DEBRIDEMENT OF RIGHT FOOT DEEP ABSCESS; EXCISION OF RIGHT SECOND METATARSAL HEAD; REMOVAL OF DEEP IMPLANT RIGHT FOOT; EXCISION OF RIGHT SECOND  TOE PROXIMAL PHALANX BASE; INSERTION OF ANTIBIOTIC BEADS INTO RIGHT FOOT ABSCESS;  Surgeon: [name], MD;  Location: [place];  Service: Orthopedics;  Laterality: Right;  SITE ALSO  RIGHT FOOT   IRRIGATION AND DEBRIDEMENT FOOT Right 07/04/2015   Irrigation and excisional debridement of right foot deep abscess;; Insertion of antibiotic beads into the right foot abscess   JOINT REPLACEMENT     MAXIMUM ACCESS (MAS)POSTERIOR LUMBAR INTERBODY FUSION (PLIF) 1 LEVEL N/A 01/01/2017   Procedure: L4-5 MAXIMUM ACCESS (MAS) POSTERIOR LUMBAR INTERBODY FUSION (PLIF);  Surgeon: Eustace Moore, MD;  Location: [place];  Service: Neurosurgery;  Laterality: N/A;   TOTAL KNEE ARTHROPLASTY Left    WEIL OSTEOTOMY Left 06/06/2015   Procedure: LEFT TWO AND THREE METATARSAL WEIL OSTEOTOMY ;  Surgeon: [name], MD;  Location: [place];  Service: Orthopedics;  Laterality: Left;   Patient Active Problem List   Diagnosis Date Noted   Metatarsal fracture 09/11/2021   S/P lumbar spinal fusion 01/01/2017   ADD (attention deficit disorder) 07/17/2015   Bipolar affective disorder ([place]) 07/17/2015   HLD (hyperlipidemia) 07/17/2015   BP (high blood pressure) 07/17/2015   Obstructive apnea  07/17/2015   Staphylococcus aureus infection    Foot osteomyelitis, right ([place]) 07/04/2015    PCP: Velna Hatchet, MD  REFERRING PROVIDER: Lynne Leader  REFERRING DIAG: R hip pain  THERAPY DIAG:  Pain in right hip  Chronic right-sided low back pain without sciatica  Pain in left ankle and joints of left foot  ONSET DATE:  1 month ago.   SUBJECTIVE:                                                                                                                                                                                           SUBJECTIVE STATEMENT: 12/25/2021 Pt states overall less pain. Hips are still Sore and tight with initial standing.    PERTINENT HISTORY:  L TKA,  Lumbar fuston L4/5 ,   PAIN:  Are  you having pain? Yes NPRS scale: 0-4 /10 Pain location: L hip ,  Pain orientation: Left  PAIN TYPE: aching Pain description: intermittent  Aggravating factors: increased activity, sleeping (up to 8/10)  Relieving factors: none stated.   Are you having pain? Yes NPRS scale: 0-1/10 Pain location: L foot Pain orientation: Left PAIN TYPE: aching Pain description: intermittent  Aggravating factors:  Relieving factors:     PRECAUTIONS: None  WEIGHT BEARING RESTRICTIONS No  FALLS:  Has patient fallen in last 6 months? Yes, Number of falls: 1-  tripped when walking dog in April.   OCCUPATION: Retired,   PLOF: Independent  PATIENT GOALS  Return to regular exercise, walking.    OBJECTIVE:  Updated 11/18/21 DIAGNOSTIC FINDINGS: none for hip or back.   COGNITION:  Overall cognitive status: Within functional limits for tasks assessed  POSTURE:   PALPATION: Much improved Soreness in R glute, piriformis,   AROM-   Lumbar: WFL,  Hips: mild limitation for R flexion/ rotation,  L knee: mild- lacking full flexion (from previous TKA) ;   MMT:  Hips: 4+/5 ;  Knees: 5/5,      TODAY'S TREATMENT   12/25/21: Aerobic:  Supine:    S/L:  Seated:  Standing:   slow march x 20; HR x 15 ; Toe raises at wall x 15;  SLS 30 sec x 2 bil; SLS with runner lunge x 10 bil;  Lateral step ups x 10 bil 6 in ;   Stretches:  seated piriformis 30 sec x 3; Standing hip flexor stretch 30 sec x 3 bil; standing lumbar SB/QL stretch 30 sec x 2 bil; Supine hip IR/ER fall in/outs  Manual:   long leg distraction on R for lumbar pump;  Modalities:     PATIENT EDUCATION:  Education details:  reviewed HEP  Person educated: Patient Education method: Explanation, Demonstration, Tactile cues, Verbal cues, and Handouts Education comprehension: verbalized understanding, returned demonstration, verbal cues required, tactile cues required, and needs further education   HOME EXERCISE PROGRAM: Access Code:  [email or web address]  ASSESSMENT:  CLINICAL IMPRESSION: 12/25/21: Pt with soreness in anterior tib with increased walking, discussed strengthening with toe raises. She has much improving hip strength. Reports of stiffness with initial movement sound consistent with OA, but pt will f/u with MD if she continues to have hip pain. Also discussed option for aquatic therapy which pt has done in the past. Plan to continue LE and single leg strength as tolerated, as well as mobility and pain relief for back/hips.   OBJECTIVE IMPAIRMENTS Abnormal gait, decreased activity tolerance, decreased balance, decreased knowledge of use of DME, decreased mobility, difficulty walking, decreased ROM, decreased strength, increased muscle spasms, impaired flexibility, improper body mechanics, and pain.   ACTIVITY LIMITATIONS cleaning, community activity, meal prep, laundry, and yard work.   PERSONAL FACTORS Past/current experiences /foot fracture ; are also affecting patient's functional outcome.    REHAB POTENTIAL: Good  CLINICAL DECISION MAKING: Stable/uncomplicated  EVALUATION COMPLEXITY: Low   GOALS: Goals reviewed with patient? Yes  SHORT TERM GOALS:  STG Name Target Date Goal status  1 Patient will be independent with initial HEP   10/07/21 MET              LONG TERM GOALS:  created 09/23/21  LTG Name Target Date Goal status  1 Patient will be independent with final HEP  12/30/21 Partially MET  2 Pt to report decreased pain in R hip to be to 0-2/10 with standing activity   12/30/21 Partially MET  3 Pt to demonstrate improved strength of L  foot/ankle to be [facility] for improved ability for gait and stairs.    12/30/21 Partially MET  4 Pt to demonstrate ability for independent ambulation, for community distances, with foot , knee, hip mechanics WNL, to improve pain and ability for community activity.   12/30/21 Partially MET  5         PLAN: PT FREQUENCY: 1-2x/week  PT DURATION: 6 weeks  PLANNED INTERVENTIONS:  Therapeutic exercises, Therapeutic activity, Neuro Muscular re-education, Balance training, Gait training, Patient/Family education, Joint mobilization, Stair training, DME instructions, Dry Needling, Electrical stimulation, Spinal mobilization, Moist heat, Taping, Traction, Ultrasound, Ionotophoresis 19m/ml Dexamethasone, and Manual therapy  PLAN FOR NEXT SESSION:    LLyndee Hensen PT, DPT 9:44 PM  12/25/21

## 2021-12-29 ENCOUNTER — Encounter: Payer: Self-pay | Admitting: Physical Therapy

## 2021-12-29 ENCOUNTER — Ambulatory Visit (INDEPENDENT_AMBULATORY_CARE_PROVIDER_SITE_OTHER): Payer: PPO | Admitting: Physical Therapy

## 2021-12-29 DIAGNOSIS — M25551 Pain in right hip: Secondary | ICD-10-CM

## 2021-12-29 DIAGNOSIS — M25572 Pain in left ankle and joints of left foot: Secondary | ICD-10-CM

## 2021-12-29 DIAGNOSIS — M545 Low back pain, unspecified: Secondary | ICD-10-CM

## 2021-12-29 DIAGNOSIS — G8929 Other chronic pain: Secondary | ICD-10-CM

## 2021-12-29 NOTE — Therapy (Signed)
OUTPATIENT PHYSICAL THERAPY TREATMENT/Discharge   Patient Name: Whitney Lewis MRN: [id number] DOB:February 18, 1961, 61 y.o., female Today's Date: 12/29/2021      PT End of Session - 12/29/21 0809     Visit Number 19    Date for PT Re-Evaluation 12/30/21    Authorization Type HTA   re-cert done vist 7,  and visit 8,  and visit 15.    PT Start Time 0805    PT Stop Time [phone]    PT Time Calculation (min) 38 min    Activity Tolerance Patient tolerated treatment well    Behavior During Therapy WFL for tasks assessed/performed                            Past Medical History:  Diagnosis Date   ADD (attention deficit disorder)    Anxiety    Arthritis    feet, knees (07/04/2015)   Bipolar disorder (HCC)    Complication of anesthesia    hard to wake up, blood pressure drops    Depression    Family history of adverse reaction to anesthesia    "sister hard to wake up also"   Fibromyalgia dx'd ~ 2000   GERD (gastroesophageal reflux disease)    Hyperlipidemia    Hypertension    Migraine    "hormonal; stopped when I was 16" (07/04/2015)   OSA (obstructive sleep apnea)    wears mouth piece, no CPAP   Seizures ([place])    Past Surgical History:  Procedure Laterality Date   ABDOMINAL HYSTERECTOMY  ~ [place]   COLONOSCOPY     COLONOSCOPY WITH PROPOFOL N/A 09/21/2017   Procedure: COLONOSCOPY WITH PROPOFOL;  Surgeon: Lollie Sails, MD;  Location: [facility] ENDOSCOPY;  Service: Endoscopy;  Laterality: N/A;   HAMMER TOE SURGERY Right 03/2015   HAMMER TOE SURGERY Left 06/06/2015   Procedure: LEFT TWO-THREE  HAMMER TOE CORRECTION ;  Surgeon: [name], MD;  Location: [place];  Service: Orthopedics;  Laterality: Left;   INCISION AND DRAINAGE OF WOUND Right 07/04/2015   Procedure: IRRIGATION AND DEBRIDEMENT OF RIGHT FOOT DEEP ABSCESS; EXCISION OF RIGHT SECOND METATARSAL HEAD; REMOVAL OF DEEP IMPLANT RIGHT FOOT; EXCISION OF  RIGHT SECOND TOE PROXIMAL PHALANX BASE; INSERTION OF ANTIBIOTIC BEADS INTO RIGHT FOOT ABSCESS;  Surgeon: [name], MD;  Location: [place];  Service: Orthopedics;  Laterality: Right;  SITE ALSO  RIGHT FOOT   IRRIGATION AND DEBRIDEMENT FOOT Right 07/04/2015   Irrigation and excisional debridement of right foot deep abscess;; Insertion of antibiotic beads into the right foot abscess   JOINT REPLACEMENT     MAXIMUM ACCESS (MAS)POSTERIOR LUMBAR INTERBODY FUSION (PLIF) 1 LEVEL N/A 01/01/2017   Procedure: L4-5 MAXIMUM ACCESS (MAS) POSTERIOR LUMBAR INTERBODY FUSION (PLIF);  Surgeon: Eustace Moore, MD;  Location: [place];  Service: Neurosurgery;  Laterality: N/A;   TOTAL KNEE ARTHROPLASTY Left    WEIL OSTEOTOMY Left 06/06/2015   Procedure: LEFT TWO AND THREE METATARSAL WEIL OSTEOTOMY ;  Surgeon: [name], MD;  Location: [place];  Service: Orthopedics;  Laterality: Left;   Patient Active Problem List   Diagnosis Date Noted   Metatarsal fracture 09/11/2021   S/P lumbar spinal fusion 01/01/2017   ADD (attention deficit disorder) 07/17/2015   Bipolar affective disorder ([place]) 07/17/2015   HLD (hyperlipidemia) 07/17/2015   BP (high blood pressure) 07/17/2015   Obstructive  apnea 07/17/2015   Staphylococcus aureus infection    Foot osteomyelitis, right ([place]) 07/04/2015    PCP: Velna Hatchet, MD  REFERRING PROVIDER: Lynne Leader  REFERRING DIAG: R hip pain  THERAPY DIAG:  Pain in right hip  Chronic right-sided low back pain without sciatica  Pain in left ankle and joints of left foot  ONSET DATE:  1 month ago.   SUBJECTIVE:                                                                                                                                                                                           SUBJECTIVE STATEMENT: 12/29/2021 Pt states less pain in hips and leg in last few days. She also has not done too much walking activity in last few days.    PERTINENT  HISTORY:  L TKA,  Lumbar fuston L4/5 ,   PAIN:  Are you having pain? Yes NPRS scale: 0-4 /10 Pain location: L hip ,  Pain orientation: Left  PAIN TYPE: aching Pain description: intermittent  Aggravating factors: increased activity, sleeping (up to 8/10)  Relieving factors: none stated.   Are you having pain? Yes NPRS scale: 0-1/10 Pain location: L foot Pain orientation: Left PAIN TYPE: aching Pain description: intermittent  Aggravating factors:  Relieving factors:     PRECAUTIONS: None  WEIGHT BEARING RESTRICTIONS No  FALLS:  Has patient fallen in last 6 months? Yes, Number of falls: 1-  tripped when walking dog in April.   OCCUPATION: Retired,   PLOF: Independent  PATIENT GOALS  Return to regular exercise, walking.    OBJECTIVE:  Updated 11/18/21 DIAGNOSTIC FINDINGS: none for hip or back.   COGNITION:  Overall cognitive status: Within functional limits for tasks assessed  POSTURE:   PALPATION: Much improved Soreness in R glute, piriformis,   AROM-   Lumbar: WFL,  Hips: mild limitation for R flexion/ rotation,  L knee: mild- lacking full flexion (from previous TKA) ;   MMT:  Hips: 4+/5 ;  Knees: 5/5,      TODAY'S TREATMENT   12/29/21: Aerobic:  Supine:    S/L:  Seated:  Standing:   slow march x 20;  HR x 20 ; Toe raises at wall x 15;  SLS with UE rot x 10 bil; SLS with runner lunge x 10 bil;  bwd walk/march 20 ft x 6;  Stretches:   Standing hip flexor stretch 30 sec x 3 bil; standing lumbar SB/QL stretch 30 sec x 2 bil; Supine hip IR/ER fall in/outs  Manual:   long leg distraction on R for lumbar pump;  Modalities:     PATIENT  EDUCATION:  Education details:  reviewed HEP  Person educated: Patient Education method: Explanation, Demonstration, Corporate treasurer cues, Verbal cues, and Handouts Education comprehension: verbalized understanding, returned demonstration, verbal cues required, tactile cues required, and needs further education   HOME EXERCISE  PROGRAM: Access Code: [email or web address]  ASSESSMENT:  CLINICAL IMPRESSION: 12/29/21: Pt doing quite well today. She notes less stiffness in hips, relates it to not doing a lot of standing/walking/exercise. She is doing very well with HEP at this time, with mobility for hips, hip and LE strength, as well as stability for LE /ankle. She has had good improvements of hip and foot pain. Discussed activity modification and trying to walk every other day vs daily, to see if it continues to help hip stiffness. She may be getting increased soreness/stiffness the next day due to over doing activity. Pt has met goals at this time, ready for d/c to HEP, pt in agreement with plan. Final Hep reviewed today in detail. She will f/u with sports med if she has increased pain in hips or LEs.  OBJECTIVE IMPAIRMENTS Abnormal gait, decreased activity tolerance, decreased balance, decreased knowledge of use of DME, decreased mobility, difficulty walking, decreased ROM, decreased strength, increased muscle spasms, impaired flexibility, improper body mechanics, and pain.   ACTIVITY LIMITATIONS cleaning, community activity, meal prep, laundry, and yard work.   PERSONAL FACTORS Past/current experiences /foot fracture ; are also affecting patient's functional outcome.    REHAB POTENTIAL: Good  CLINICAL DECISION MAKING: Stable/uncomplicated  EVALUATION COMPLEXITY: Low   GOALS: Goals reviewed with patient? Yes  SHORT TERM GOALS:  STG Name Target Date Goal status  1 Patient will be independent with initial HEP   10/07/21 MET              LONG TERM GOALS:  created 09/23/21  LTG Name Target Date Goal status  1 Patient will be independent with final HEP  12/30/21 MET  2 Pt to report decreased pain in R hip to be to 0-2/10 with standing activity   12/30/21 MET  3 Pt to demonstrate improved strength of L  foot/ankle to be [facility] for improved ability for gait and stairs.    12/30/21  MET  4 Pt to demonstrate ability for independent  ambulation, for community distances, with foot , knee, hip mechanics WNL, to improve pain and ability for community activity.   12/30/21  MET  5         PLAN: PT FREQUENCY: 1-2x/week  PT DURATION: 6 weeks  PLANNED INTERVENTIONS: Therapeutic exercises, Therapeutic activity, Neuro Muscular re-education, Balance training, Gait training, Patient/Family education, Joint mobilization, Stair training, DME instructions, Dry Needling, Electrical stimulation, Spinal mobilization, Moist heat, Taping, Traction, Ultrasound, Ionotophoresis 64m/ml Dexamethasone, and Manual therapy  PLAN FOR NEXT SESSION:    LLyndee Hensen PT, DPT 8:46 AM  12/29/21   PHYSICAL THERAPY DISCHARGE SUMMARY  Visits from Start of Care: 19  Plan: Patient agrees to discharge.  Patient goals were  met. Patient is being discharged due to meeting the stated rehab goals.     LLyndee Hensen PT, DPT 8:47 AM  12/29/21

## 2022-05-27 DIAGNOSIS — E785 Hyperlipidemia, unspecified: Secondary | ICD-10-CM | POA: Diagnosis not present

## 2022-05-27 DIAGNOSIS — G4733 Obstructive sleep apnea (adult) (pediatric): Secondary | ICD-10-CM | POA: Diagnosis not present

## 2022-05-27 DIAGNOSIS — I10 Essential (primary) hypertension: Secondary | ICD-10-CM | POA: Diagnosis not present

## 2022-05-27 DIAGNOSIS — G40909 Epilepsy, unspecified, not intractable, without status epilepticus: Secondary | ICD-10-CM | POA: Diagnosis not present

## 2022-05-27 DIAGNOSIS — M720 Palmar fascial fibromatosis [Dupuytren]: Secondary | ICD-10-CM | POA: Diagnosis not present

## 2022-05-27 DIAGNOSIS — Z800 Family history of malignant neoplasm of digestive organs: Secondary | ICD-10-CM | POA: Diagnosis not present

## 2022-05-27 DIAGNOSIS — M8580 Other specified disorders of bone density and structure, unspecified site: Secondary | ICD-10-CM | POA: Diagnosis not present

## 2022-05-27 DIAGNOSIS — M1990 Unspecified osteoarthritis, unspecified site: Secondary | ICD-10-CM | POA: Diagnosis not present

## 2022-05-27 DIAGNOSIS — R69 Illness, unspecified: Secondary | ICD-10-CM | POA: Diagnosis not present

## 2022-05-28 DIAGNOSIS — M9902 Segmental and somatic dysfunction of thoracic region: Secondary | ICD-10-CM | POA: Diagnosis not present

## 2022-05-28 DIAGNOSIS — M2669 Other specified disorders of temporomandibular joint: Secondary | ICD-10-CM | POA: Diagnosis not present

## 2022-05-28 DIAGNOSIS — M5136 Other intervertebral disc degeneration, lumbar region: Secondary | ICD-10-CM | POA: Diagnosis not present

## 2022-05-28 DIAGNOSIS — M50323 Other cervical disc degeneration at C6-C7 level: Secondary | ICD-10-CM | POA: Diagnosis not present

## 2022-05-28 DIAGNOSIS — M9903 Segmental and somatic dysfunction of lumbar region: Secondary | ICD-10-CM | POA: Diagnosis not present

## 2022-05-28 DIAGNOSIS — M62830 Muscle spasm of back: Secondary | ICD-10-CM | POA: Diagnosis not present

## 2022-06-04 DIAGNOSIS — M62830 Muscle spasm of back: Secondary | ICD-10-CM | POA: Diagnosis not present

## 2022-06-04 DIAGNOSIS — M2669 Other specified disorders of temporomandibular joint: Secondary | ICD-10-CM | POA: Diagnosis not present

## 2022-06-04 DIAGNOSIS — M9902 Segmental and somatic dysfunction of thoracic region: Secondary | ICD-10-CM | POA: Diagnosis not present

## 2022-06-04 DIAGNOSIS — M50323 Other cervical disc degeneration at C6-C7 level: Secondary | ICD-10-CM | POA: Diagnosis not present

## 2022-06-04 DIAGNOSIS — R69 Illness, unspecified: Secondary | ICD-10-CM | POA: Diagnosis not present

## 2022-06-04 DIAGNOSIS — M5136 Other intervertebral disc degeneration, lumbar region: Secondary | ICD-10-CM | POA: Diagnosis not present

## 2022-06-09 DIAGNOSIS — F3171 Bipolar disorder, in partial remission, most recent episode hypomanic: Secondary | ICD-10-CM | POA: Diagnosis not present

## 2022-06-09 DIAGNOSIS — R69 Illness, unspecified: Secondary | ICD-10-CM | POA: Diagnosis not present

## 2022-06-11 DIAGNOSIS — M2669 Other specified disorders of temporomandibular joint: Secondary | ICD-10-CM | POA: Diagnosis not present

## 2022-06-11 DIAGNOSIS — M9902 Segmental and somatic dysfunction of thoracic region: Secondary | ICD-10-CM | POA: Diagnosis not present

## 2022-06-11 DIAGNOSIS — M5136 Other intervertebral disc degeneration, lumbar region: Secondary | ICD-10-CM | POA: Diagnosis not present

## 2022-06-11 DIAGNOSIS — R69 Illness, unspecified: Secondary | ICD-10-CM | POA: Diagnosis not present

## 2022-06-11 DIAGNOSIS — M9903 Segmental and somatic dysfunction of lumbar region: Secondary | ICD-10-CM | POA: Diagnosis not present

## 2022-06-11 DIAGNOSIS — M62830 Muscle spasm of back: Secondary | ICD-10-CM | POA: Diagnosis not present

## 2022-06-11 DIAGNOSIS — M50323 Other cervical disc degeneration at C6-C7 level: Secondary | ICD-10-CM | POA: Diagnosis not present

## 2022-06-18 DIAGNOSIS — M62830 Muscle spasm of back: Secondary | ICD-10-CM | POA: Diagnosis not present

## 2022-06-18 DIAGNOSIS — M2669 Other specified disorders of temporomandibular joint: Secondary | ICD-10-CM | POA: Diagnosis not present

## 2022-06-18 DIAGNOSIS — M5136 Other intervertebral disc degeneration, lumbar region: Secondary | ICD-10-CM | POA: Diagnosis not present

## 2022-06-18 DIAGNOSIS — M50323 Other cervical disc degeneration at C6-C7 level: Secondary | ICD-10-CM | POA: Diagnosis not present

## 2022-06-18 DIAGNOSIS — M9902 Segmental and somatic dysfunction of thoracic region: Secondary | ICD-10-CM | POA: Diagnosis not present

## 2022-06-18 DIAGNOSIS — M9903 Segmental and somatic dysfunction of lumbar region: Secondary | ICD-10-CM | POA: Diagnosis not present

## 2022-06-24 DIAGNOSIS — Z936 Other artificial openings of urinary tract status: Secondary | ICD-10-CM | POA: Diagnosis not present

## 2022-06-25 DIAGNOSIS — M9903 Segmental and somatic dysfunction of lumbar region: Secondary | ICD-10-CM | POA: Diagnosis not present

## 2022-06-25 DIAGNOSIS — M2669 Other specified disorders of temporomandibular joint: Secondary | ICD-10-CM | POA: Diagnosis not present

## 2022-06-25 DIAGNOSIS — M62830 Muscle spasm of back: Secondary | ICD-10-CM | POA: Diagnosis not present

## 2022-06-25 DIAGNOSIS — M5136 Other intervertebral disc degeneration, lumbar region: Secondary | ICD-10-CM | POA: Diagnosis not present

## 2022-06-25 DIAGNOSIS — M9902 Segmental and somatic dysfunction of thoracic region: Secondary | ICD-10-CM | POA: Diagnosis not present

## 2022-06-25 DIAGNOSIS — M50323 Other cervical disc degeneration at C6-C7 level: Secondary | ICD-10-CM | POA: Diagnosis not present

## 2022-06-29 DIAGNOSIS — R69 Illness, unspecified: Secondary | ICD-10-CM | POA: Diagnosis not present

## 2022-06-29 DIAGNOSIS — M62830 Muscle spasm of back: Secondary | ICD-10-CM | POA: Diagnosis not present

## 2022-06-29 DIAGNOSIS — F900 Attention-deficit hyperactivity disorder, predominantly inattentive type: Secondary | ICD-10-CM | POA: Diagnosis not present

## 2022-06-29 DIAGNOSIS — G4700 Insomnia, unspecified: Secondary | ICD-10-CM | POA: Diagnosis not present

## 2022-06-29 DIAGNOSIS — M5136 Other intervertebral disc degeneration, lumbar region: Secondary | ICD-10-CM | POA: Diagnosis not present

## 2022-06-29 DIAGNOSIS — M9903 Segmental and somatic dysfunction of lumbar region: Secondary | ICD-10-CM | POA: Diagnosis not present

## 2022-06-29 DIAGNOSIS — M9902 Segmental and somatic dysfunction of thoracic region: Secondary | ICD-10-CM | POA: Diagnosis not present

## 2022-06-29 DIAGNOSIS — M50323 Other cervical disc degeneration at C6-C7 level: Secondary | ICD-10-CM | POA: Diagnosis not present

## 2022-06-29 DIAGNOSIS — F4321 Adjustment disorder with depressed mood: Secondary | ICD-10-CM | POA: Diagnosis not present

## 2022-06-29 DIAGNOSIS — F3181 Bipolar II disorder: Secondary | ICD-10-CM | POA: Diagnosis not present

## 2022-06-29 DIAGNOSIS — M2669 Other specified disorders of temporomandibular joint: Secondary | ICD-10-CM | POA: Diagnosis not present

## 2022-06-30 DIAGNOSIS — R69 Illness, unspecified: Secondary | ICD-10-CM | POA: Diagnosis not present

## 2022-06-30 DIAGNOSIS — F3171 Bipolar disorder, in partial remission, most recent episode hypomanic: Secondary | ICD-10-CM | POA: Diagnosis not present

## 2022-07-06 DIAGNOSIS — M50323 Other cervical disc degeneration at C6-C7 level: Secondary | ICD-10-CM | POA: Diagnosis not present

## 2022-07-06 DIAGNOSIS — M9902 Segmental and somatic dysfunction of thoracic region: Secondary | ICD-10-CM | POA: Diagnosis not present

## 2022-07-06 DIAGNOSIS — M5136 Other intervertebral disc degeneration, lumbar region: Secondary | ICD-10-CM | POA: Diagnosis not present

## 2022-07-06 DIAGNOSIS — M9903 Segmental and somatic dysfunction of lumbar region: Secondary | ICD-10-CM | POA: Diagnosis not present

## 2022-07-06 DIAGNOSIS — M62830 Muscle spasm of back: Secondary | ICD-10-CM | POA: Diagnosis not present

## 2022-07-06 DIAGNOSIS — M2669 Other specified disorders of temporomandibular joint: Secondary | ICD-10-CM | POA: Diagnosis not present

## 2022-07-13 DIAGNOSIS — M9902 Segmental and somatic dysfunction of thoracic region: Secondary | ICD-10-CM | POA: Diagnosis not present

## 2022-07-13 DIAGNOSIS — M9903 Segmental and somatic dysfunction of lumbar region: Secondary | ICD-10-CM | POA: Diagnosis not present

## 2022-07-13 DIAGNOSIS — M2669 Other specified disorders of temporomandibular joint: Secondary | ICD-10-CM | POA: Diagnosis not present

## 2022-07-13 DIAGNOSIS — M50323 Other cervical disc degeneration at C6-C7 level: Secondary | ICD-10-CM | POA: Diagnosis not present

## 2022-07-13 DIAGNOSIS — M5136 Other intervertebral disc degeneration, lumbar region: Secondary | ICD-10-CM | POA: Diagnosis not present

## 2022-07-13 DIAGNOSIS — M62830 Muscle spasm of back: Secondary | ICD-10-CM | POA: Diagnosis not present

## 2022-07-20 DIAGNOSIS — M2669 Other specified disorders of temporomandibular joint: Secondary | ICD-10-CM | POA: Diagnosis not present

## 2022-07-20 DIAGNOSIS — M5136 Other intervertebral disc degeneration, lumbar region: Secondary | ICD-10-CM | POA: Diagnosis not present

## 2022-07-20 DIAGNOSIS — M9902 Segmental and somatic dysfunction of thoracic region: Secondary | ICD-10-CM | POA: Diagnosis not present

## 2022-07-20 DIAGNOSIS — M50323 Other cervical disc degeneration at C6-C7 level: Secondary | ICD-10-CM | POA: Diagnosis not present

## 2022-07-20 DIAGNOSIS — M9903 Segmental and somatic dysfunction of lumbar region: Secondary | ICD-10-CM | POA: Diagnosis not present

## 2022-07-20 DIAGNOSIS — M62830 Muscle spasm of back: Secondary | ICD-10-CM | POA: Diagnosis not present

## 2022-07-23 DIAGNOSIS — M9902 Segmental and somatic dysfunction of thoracic region: Secondary | ICD-10-CM | POA: Diagnosis not present

## 2022-07-23 DIAGNOSIS — M62830 Muscle spasm of back: Secondary | ICD-10-CM | POA: Diagnosis not present

## 2022-07-23 DIAGNOSIS — M5136 Other intervertebral disc degeneration, lumbar region: Secondary | ICD-10-CM | POA: Diagnosis not present

## 2022-07-23 DIAGNOSIS — M9903 Segmental and somatic dysfunction of lumbar region: Secondary | ICD-10-CM | POA: Diagnosis not present

## 2022-07-23 DIAGNOSIS — M50323 Other cervical disc degeneration at C6-C7 level: Secondary | ICD-10-CM | POA: Diagnosis not present

## 2022-07-23 DIAGNOSIS — M2669 Other specified disorders of temporomandibular joint: Secondary | ICD-10-CM | POA: Diagnosis not present

## 2022-07-27 DIAGNOSIS — M9902 Segmental and somatic dysfunction of thoracic region: Secondary | ICD-10-CM | POA: Diagnosis not present

## 2022-07-27 DIAGNOSIS — M2669 Other specified disorders of temporomandibular joint: Secondary | ICD-10-CM | POA: Diagnosis not present

## 2022-07-27 DIAGNOSIS — M50323 Other cervical disc degeneration at C6-C7 level: Secondary | ICD-10-CM | POA: Diagnosis not present

## 2022-07-27 DIAGNOSIS — M5136 Other intervertebral disc degeneration, lumbar region: Secondary | ICD-10-CM | POA: Diagnosis not present

## 2022-07-27 DIAGNOSIS — M62830 Muscle spasm of back: Secondary | ICD-10-CM | POA: Diagnosis not present

## 2022-07-27 DIAGNOSIS — M9903 Segmental and somatic dysfunction of lumbar region: Secondary | ICD-10-CM | POA: Diagnosis not present

## 2022-07-28 DIAGNOSIS — R69 Illness, unspecified: Secondary | ICD-10-CM | POA: Diagnosis not present

## 2022-07-28 DIAGNOSIS — F3171 Bipolar disorder, in partial remission, most recent episode hypomanic: Secondary | ICD-10-CM | POA: Diagnosis not present

## 2022-07-30 DIAGNOSIS — M2669 Other specified disorders of temporomandibular joint: Secondary | ICD-10-CM | POA: Diagnosis not present

## 2022-07-30 DIAGNOSIS — M62830 Muscle spasm of back: Secondary | ICD-10-CM | POA: Diagnosis not present

## 2022-07-30 DIAGNOSIS — M50323 Other cervical disc degeneration at C6-C7 level: Secondary | ICD-10-CM | POA: Diagnosis not present

## 2022-07-30 DIAGNOSIS — M9903 Segmental and somatic dysfunction of lumbar region: Secondary | ICD-10-CM | POA: Diagnosis not present

## 2022-07-30 DIAGNOSIS — M5136 Other intervertebral disc degeneration, lumbar region: Secondary | ICD-10-CM | POA: Diagnosis not present

## 2022-07-30 DIAGNOSIS — M9902 Segmental and somatic dysfunction of thoracic region: Secondary | ICD-10-CM | POA: Diagnosis not present

## 2022-08-04 DIAGNOSIS — M62830 Muscle spasm of back: Secondary | ICD-10-CM | POA: Diagnosis not present

## 2022-08-04 DIAGNOSIS — M9902 Segmental and somatic dysfunction of thoracic region: Secondary | ICD-10-CM | POA: Diagnosis not present

## 2022-08-04 DIAGNOSIS — M5136 Other intervertebral disc degeneration, lumbar region: Secondary | ICD-10-CM | POA: Diagnosis not present

## 2022-08-04 DIAGNOSIS — M2669 Other specified disorders of temporomandibular joint: Secondary | ICD-10-CM | POA: Diagnosis not present

## 2022-08-04 DIAGNOSIS — M9903 Segmental and somatic dysfunction of lumbar region: Secondary | ICD-10-CM | POA: Diagnosis not present

## 2022-08-04 DIAGNOSIS — M50323 Other cervical disc degeneration at C6-C7 level: Secondary | ICD-10-CM | POA: Diagnosis not present

## 2022-08-07 DIAGNOSIS — M2669 Other specified disorders of temporomandibular joint: Secondary | ICD-10-CM | POA: Diagnosis not present

## 2022-08-07 DIAGNOSIS — M9903 Segmental and somatic dysfunction of lumbar region: Secondary | ICD-10-CM | POA: Diagnosis not present

## 2022-08-07 DIAGNOSIS — M50323 Other cervical disc degeneration at C6-C7 level: Secondary | ICD-10-CM | POA: Diagnosis not present

## 2022-08-07 DIAGNOSIS — M5136 Other intervertebral disc degeneration, lumbar region: Secondary | ICD-10-CM | POA: Diagnosis not present

## 2022-08-07 DIAGNOSIS — M9902 Segmental and somatic dysfunction of thoracic region: Secondary | ICD-10-CM | POA: Diagnosis not present

## 2022-08-07 DIAGNOSIS — M62830 Muscle spasm of back: Secondary | ICD-10-CM | POA: Diagnosis not present

## 2022-08-10 DIAGNOSIS — M50323 Other cervical disc degeneration at C6-C7 level: Secondary | ICD-10-CM | POA: Diagnosis not present

## 2022-08-10 DIAGNOSIS — M62830 Muscle spasm of back: Secondary | ICD-10-CM | POA: Diagnosis not present

## 2022-08-10 DIAGNOSIS — M9902 Segmental and somatic dysfunction of thoracic region: Secondary | ICD-10-CM | POA: Diagnosis not present

## 2022-08-10 DIAGNOSIS — M2669 Other specified disorders of temporomandibular joint: Secondary | ICD-10-CM | POA: Diagnosis not present

## 2022-08-10 DIAGNOSIS — M9903 Segmental and somatic dysfunction of lumbar region: Secondary | ICD-10-CM | POA: Diagnosis not present

## 2022-08-10 DIAGNOSIS — M5136 Other intervertebral disc degeneration, lumbar region: Secondary | ICD-10-CM | POA: Diagnosis not present

## 2022-08-18 DIAGNOSIS — R69 Illness, unspecified: Secondary | ICD-10-CM | POA: Diagnosis not present

## 2022-08-18 DIAGNOSIS — F3171 Bipolar disorder, in partial remission, most recent episode hypomanic: Secondary | ICD-10-CM | POA: Diagnosis not present

## 2022-08-19 DIAGNOSIS — M5136 Other intervertebral disc degeneration, lumbar region: Secondary | ICD-10-CM | POA: Diagnosis not present

## 2022-08-19 DIAGNOSIS — M9902 Segmental and somatic dysfunction of thoracic region: Secondary | ICD-10-CM | POA: Diagnosis not present

## 2022-08-19 DIAGNOSIS — M50323 Other cervical disc degeneration at C6-C7 level: Secondary | ICD-10-CM | POA: Diagnosis not present

## 2022-08-19 DIAGNOSIS — M9903 Segmental and somatic dysfunction of lumbar region: Secondary | ICD-10-CM | POA: Diagnosis not present

## 2022-08-19 DIAGNOSIS — M2669 Other specified disorders of temporomandibular joint: Secondary | ICD-10-CM | POA: Diagnosis not present

## 2022-08-19 DIAGNOSIS — M62830 Muscle spasm of back: Secondary | ICD-10-CM | POA: Diagnosis not present

## 2022-08-25 DIAGNOSIS — L908 Other atrophic disorders of skin: Secondary | ICD-10-CM | POA: Diagnosis not present

## 2022-08-25 DIAGNOSIS — L820 Inflamed seborrheic keratosis: Secondary | ICD-10-CM | POA: Diagnosis not present

## 2022-08-25 DIAGNOSIS — Z411 Encounter for cosmetic surgery: Secondary | ICD-10-CM | POA: Diagnosis not present

## 2022-08-26 DIAGNOSIS — M50323 Other cervical disc degeneration at C6-C7 level: Secondary | ICD-10-CM | POA: Diagnosis not present

## 2022-08-26 DIAGNOSIS — M2669 Other specified disorders of temporomandibular joint: Secondary | ICD-10-CM | POA: Diagnosis not present

## 2022-08-26 DIAGNOSIS — M62830 Muscle spasm of back: Secondary | ICD-10-CM | POA: Diagnosis not present

## 2022-08-26 DIAGNOSIS — M9902 Segmental and somatic dysfunction of thoracic region: Secondary | ICD-10-CM | POA: Diagnosis not present

## 2022-08-26 DIAGNOSIS — M5136 Other intervertebral disc degeneration, lumbar region: Secondary | ICD-10-CM | POA: Diagnosis not present

## 2022-08-26 DIAGNOSIS — M9903 Segmental and somatic dysfunction of lumbar region: Secondary | ICD-10-CM | POA: Diagnosis not present

## 2022-09-01 DIAGNOSIS — M9902 Segmental and somatic dysfunction of thoracic region: Secondary | ICD-10-CM | POA: Diagnosis not present

## 2022-09-01 DIAGNOSIS — M62830 Muscle spasm of back: Secondary | ICD-10-CM | POA: Diagnosis not present

## 2022-09-01 DIAGNOSIS — M5136 Other intervertebral disc degeneration, lumbar region: Secondary | ICD-10-CM | POA: Diagnosis not present

## 2022-09-01 DIAGNOSIS — M2669 Other specified disorders of temporomandibular joint: Secondary | ICD-10-CM | POA: Diagnosis not present

## 2022-09-01 DIAGNOSIS — M9903 Segmental and somatic dysfunction of lumbar region: Secondary | ICD-10-CM | POA: Diagnosis not present

## 2022-09-01 DIAGNOSIS — M50323 Other cervical disc degeneration at C6-C7 level: Secondary | ICD-10-CM | POA: Diagnosis not present

## 2022-09-03 DIAGNOSIS — M50323 Other cervical disc degeneration at C6-C7 level: Secondary | ICD-10-CM | POA: Diagnosis not present

## 2022-09-03 DIAGNOSIS — M5136 Other intervertebral disc degeneration, lumbar region: Secondary | ICD-10-CM | POA: Diagnosis not present

## 2022-09-03 DIAGNOSIS — M2669 Other specified disorders of temporomandibular joint: Secondary | ICD-10-CM | POA: Diagnosis not present

## 2022-09-03 DIAGNOSIS — M62830 Muscle spasm of back: Secondary | ICD-10-CM | POA: Diagnosis not present

## 2022-09-03 DIAGNOSIS — M9903 Segmental and somatic dysfunction of lumbar region: Secondary | ICD-10-CM | POA: Diagnosis not present

## 2022-09-03 DIAGNOSIS — M9902 Segmental and somatic dysfunction of thoracic region: Secondary | ICD-10-CM | POA: Diagnosis not present

## 2022-09-08 DIAGNOSIS — M50323 Other cervical disc degeneration at C6-C7 level: Secondary | ICD-10-CM | POA: Diagnosis not present

## 2022-09-08 DIAGNOSIS — M62830 Muscle spasm of back: Secondary | ICD-10-CM | POA: Diagnosis not present

## 2022-09-08 DIAGNOSIS — M5136 Other intervertebral disc degeneration, lumbar region: Secondary | ICD-10-CM | POA: Diagnosis not present

## 2022-09-08 DIAGNOSIS — M2669 Other specified disorders of temporomandibular joint: Secondary | ICD-10-CM | POA: Diagnosis not present

## 2022-09-08 DIAGNOSIS — M9902 Segmental and somatic dysfunction of thoracic region: Secondary | ICD-10-CM | POA: Diagnosis not present

## 2022-09-10 DIAGNOSIS — M50323 Other cervical disc degeneration at C6-C7 level: Secondary | ICD-10-CM | POA: Diagnosis not present

## 2022-09-10 DIAGNOSIS — M9902 Segmental and somatic dysfunction of thoracic region: Secondary | ICD-10-CM | POA: Diagnosis not present

## 2022-09-10 DIAGNOSIS — M2669 Other specified disorders of temporomandibular joint: Secondary | ICD-10-CM | POA: Diagnosis not present

## 2022-09-10 DIAGNOSIS — M5136 Other intervertebral disc degeneration, lumbar region: Secondary | ICD-10-CM | POA: Diagnosis not present

## 2022-09-10 DIAGNOSIS — M62830 Muscle spasm of back: Secondary | ICD-10-CM | POA: Diagnosis not present

## 2022-09-14 DIAGNOSIS — M2669 Other specified disorders of temporomandibular joint: Secondary | ICD-10-CM | POA: Diagnosis not present

## 2022-09-14 DIAGNOSIS — M50323 Other cervical disc degeneration at C6-C7 level: Secondary | ICD-10-CM | POA: Diagnosis not present

## 2022-09-14 DIAGNOSIS — M9903 Segmental and somatic dysfunction of lumbar region: Secondary | ICD-10-CM | POA: Diagnosis not present

## 2022-09-14 DIAGNOSIS — M9902 Segmental and somatic dysfunction of thoracic region: Secondary | ICD-10-CM | POA: Diagnosis not present

## 2022-09-14 DIAGNOSIS — M62830 Muscle spasm of back: Secondary | ICD-10-CM | POA: Diagnosis not present

## 2022-09-14 DIAGNOSIS — M5136 Other intervertebral disc degeneration, lumbar region: Secondary | ICD-10-CM | POA: Diagnosis not present

## 2022-09-16 DIAGNOSIS — F3171 Bipolar disorder, in partial remission, most recent episode hypomanic: Secondary | ICD-10-CM | POA: Diagnosis not present

## 2022-09-17 DIAGNOSIS — M50323 Other cervical disc degeneration at C6-C7 level: Secondary | ICD-10-CM | POA: Diagnosis not present

## 2022-09-17 DIAGNOSIS — M5136 Other intervertebral disc degeneration, lumbar region: Secondary | ICD-10-CM | POA: Diagnosis not present

## 2022-09-17 DIAGNOSIS — M9902 Segmental and somatic dysfunction of thoracic region: Secondary | ICD-10-CM | POA: Diagnosis not present

## 2022-09-17 DIAGNOSIS — M2669 Other specified disorders of temporomandibular joint: Secondary | ICD-10-CM | POA: Diagnosis not present

## 2022-09-17 DIAGNOSIS — M9903 Segmental and somatic dysfunction of lumbar region: Secondary | ICD-10-CM | POA: Diagnosis not present

## 2022-09-17 DIAGNOSIS — M62830 Muscle spasm of back: Secondary | ICD-10-CM | POA: Diagnosis not present

## 2022-09-22 DIAGNOSIS — M2669 Other specified disorders of temporomandibular joint: Secondary | ICD-10-CM | POA: Diagnosis not present

## 2022-09-22 DIAGNOSIS — M50323 Other cervical disc degeneration at C6-C7 level: Secondary | ICD-10-CM | POA: Diagnosis not present

## 2022-09-22 DIAGNOSIS — M62830 Muscle spasm of back: Secondary | ICD-10-CM | POA: Diagnosis not present

## 2022-09-22 DIAGNOSIS — M9903 Segmental and somatic dysfunction of lumbar region: Secondary | ICD-10-CM | POA: Diagnosis not present

## 2022-09-22 DIAGNOSIS — M9902 Segmental and somatic dysfunction of thoracic region: Secondary | ICD-10-CM | POA: Diagnosis not present

## 2022-09-22 DIAGNOSIS — M5136 Other intervertebral disc degeneration, lumbar region: Secondary | ICD-10-CM | POA: Diagnosis not present

## 2022-09-23 DIAGNOSIS — D3132 Benign neoplasm of left choroid: Secondary | ICD-10-CM | POA: Diagnosis not present

## 2022-09-29 DIAGNOSIS — M50323 Other cervical disc degeneration at C6-C7 level: Secondary | ICD-10-CM | POA: Diagnosis not present

## 2022-09-29 DIAGNOSIS — M9903 Segmental and somatic dysfunction of lumbar region: Secondary | ICD-10-CM | POA: Diagnosis not present

## 2022-09-29 DIAGNOSIS — M9902 Segmental and somatic dysfunction of thoracic region: Secondary | ICD-10-CM | POA: Diagnosis not present

## 2022-10-07 DIAGNOSIS — F3171 Bipolar disorder, in partial remission, most recent episode hypomanic: Secondary | ICD-10-CM | POA: Diagnosis not present

## 2022-10-09 DIAGNOSIS — M62830 Muscle spasm of back: Secondary | ICD-10-CM | POA: Diagnosis not present

## 2022-10-09 DIAGNOSIS — M2669 Other specified disorders of temporomandibular joint: Secondary | ICD-10-CM | POA: Diagnosis not present

## 2022-10-09 DIAGNOSIS — M5136 Other intervertebral disc degeneration, lumbar region: Secondary | ICD-10-CM | POA: Diagnosis not present

## 2022-10-09 DIAGNOSIS — M9903 Segmental and somatic dysfunction of lumbar region: Secondary | ICD-10-CM | POA: Diagnosis not present

## 2022-10-09 DIAGNOSIS — M9902 Segmental and somatic dysfunction of thoracic region: Secondary | ICD-10-CM | POA: Diagnosis not present

## 2022-10-09 DIAGNOSIS — M50323 Other cervical disc degeneration at C6-C7 level: Secondary | ICD-10-CM | POA: Diagnosis not present

## 2022-10-28 DIAGNOSIS — I10 Essential (primary) hypertension: Secondary | ICD-10-CM | POA: Diagnosis not present

## 2022-10-28 DIAGNOSIS — M8580 Other specified disorders of bone density and structure, unspecified site: Secondary | ICD-10-CM | POA: Diagnosis not present

## 2022-10-28 DIAGNOSIS — E785 Hyperlipidemia, unspecified: Secondary | ICD-10-CM | POA: Diagnosis not present

## 2022-10-28 DIAGNOSIS — R7989 Other specified abnormal findings of blood chemistry: Secondary | ICD-10-CM | POA: Diagnosis not present

## 2022-10-28 DIAGNOSIS — K219 Gastro-esophageal reflux disease without esophagitis: Secondary | ICD-10-CM | POA: Diagnosis not present

## 2022-10-29 DIAGNOSIS — G4700 Insomnia, unspecified: Secondary | ICD-10-CM | POA: Diagnosis not present

## 2022-10-29 DIAGNOSIS — F900 Attention-deficit hyperactivity disorder, predominantly inattentive type: Secondary | ICD-10-CM | POA: Diagnosis not present

## 2022-10-29 DIAGNOSIS — F3181 Bipolar II disorder: Secondary | ICD-10-CM | POA: Diagnosis not present

## 2022-10-29 DIAGNOSIS — F4321 Adjustment disorder with depressed mood: Secondary | ICD-10-CM | POA: Diagnosis not present

## 2022-11-03 DIAGNOSIS — I10 Essential (primary) hypertension: Secondary | ICD-10-CM | POA: Diagnosis not present

## 2022-11-03 DIAGNOSIS — Z0000 Encounter for general adult medical examination without abnormal findings: Secondary | ICD-10-CM | POA: Diagnosis not present

## 2022-11-03 DIAGNOSIS — G4733 Obstructive sleep apnea (adult) (pediatric): Secondary | ICD-10-CM | POA: Diagnosis not present

## 2022-11-03 DIAGNOSIS — M8580 Other specified disorders of bone density and structure, unspecified site: Secondary | ICD-10-CM | POA: Diagnosis not present

## 2022-11-03 DIAGNOSIS — M2669 Other specified disorders of temporomandibular joint: Secondary | ICD-10-CM | POA: Diagnosis not present

## 2022-11-03 DIAGNOSIS — F3171 Bipolar disorder, in partial remission, most recent episode hypomanic: Secondary | ICD-10-CM | POA: Diagnosis not present

## 2022-11-03 DIAGNOSIS — F439 Reaction to severe stress, unspecified: Secondary | ICD-10-CM | POA: Diagnosis not present

## 2022-11-03 DIAGNOSIS — F988 Other specified behavioral and emotional disorders with onset usually occurring in childhood and adolescence: Secondary | ICD-10-CM | POA: Diagnosis not present

## 2022-11-03 DIAGNOSIS — M5136 Other intervertebral disc degeneration, lumbar region: Secondary | ICD-10-CM | POA: Diagnosis not present

## 2022-11-03 DIAGNOSIS — F319 Bipolar disorder, unspecified: Secondary | ICD-10-CM | POA: Diagnosis not present

## 2022-11-03 DIAGNOSIS — M9903 Segmental and somatic dysfunction of lumbar region: Secondary | ICD-10-CM | POA: Diagnosis not present

## 2022-11-03 DIAGNOSIS — M50323 Other cervical disc degeneration at C6-C7 level: Secondary | ICD-10-CM | POA: Diagnosis not present

## 2022-11-03 DIAGNOSIS — E785 Hyperlipidemia, unspecified: Secondary | ICD-10-CM | POA: Diagnosis not present

## 2022-11-03 DIAGNOSIS — M62830 Muscle spasm of back: Secondary | ICD-10-CM | POA: Diagnosis not present

## 2022-11-03 DIAGNOSIS — M1990 Unspecified osteoarthritis, unspecified site: Secondary | ICD-10-CM | POA: Diagnosis not present

## 2022-11-03 DIAGNOSIS — Z1331 Encounter for screening for depression: Secondary | ICD-10-CM | POA: Diagnosis not present

## 2022-11-03 DIAGNOSIS — M9902 Segmental and somatic dysfunction of thoracic region: Secondary | ICD-10-CM | POA: Diagnosis not present

## 2022-11-03 DIAGNOSIS — F330 Major depressive disorder, recurrent, mild: Secondary | ICD-10-CM | POA: Diagnosis not present

## 2022-11-03 DIAGNOSIS — R82998 Other abnormal findings in urine: Secondary | ICD-10-CM | POA: Diagnosis not present

## 2022-11-11 DIAGNOSIS — Z86018 Personal history of other benign neoplasm: Secondary | ICD-10-CM | POA: Diagnosis not present

## 2022-11-11 DIAGNOSIS — Z411 Encounter for cosmetic surgery: Secondary | ICD-10-CM | POA: Diagnosis not present

## 2022-11-11 DIAGNOSIS — D229 Melanocytic nevi, unspecified: Secondary | ICD-10-CM | POA: Diagnosis not present

## 2022-11-11 DIAGNOSIS — L578 Other skin changes due to chronic exposure to nonionizing radiation: Secondary | ICD-10-CM | POA: Diagnosis not present

## 2022-11-11 DIAGNOSIS — L820 Inflamed seborrheic keratosis: Secondary | ICD-10-CM | POA: Diagnosis not present

## 2022-11-11 DIAGNOSIS — L821 Other seborrheic keratosis: Secondary | ICD-10-CM | POA: Diagnosis not present

## 2022-11-11 DIAGNOSIS — D1800 Hemangioma unspecified site: Secondary | ICD-10-CM | POA: Diagnosis not present

## 2022-11-11 DIAGNOSIS — L816 Other disorders of diminished melanin formation: Secondary | ICD-10-CM | POA: Diagnosis not present

## 2022-11-17 DIAGNOSIS — M62830 Muscle spasm of back: Secondary | ICD-10-CM | POA: Diagnosis not present

## 2022-11-17 DIAGNOSIS — M50323 Other cervical disc degeneration at C6-C7 level: Secondary | ICD-10-CM | POA: Diagnosis not present

## 2022-11-17 DIAGNOSIS — M5136 Other intervertebral disc degeneration, lumbar region: Secondary | ICD-10-CM | POA: Diagnosis not present

## 2022-11-17 DIAGNOSIS — M9902 Segmental and somatic dysfunction of thoracic region: Secondary | ICD-10-CM | POA: Diagnosis not present

## 2022-11-17 DIAGNOSIS — M9903 Segmental and somatic dysfunction of lumbar region: Secondary | ICD-10-CM | POA: Diagnosis not present

## 2022-11-17 DIAGNOSIS — M2669 Other specified disorders of temporomandibular joint: Secondary | ICD-10-CM | POA: Diagnosis not present

## 2022-12-01 DIAGNOSIS — M62830 Muscle spasm of back: Secondary | ICD-10-CM | POA: Diagnosis not present

## 2022-12-01 DIAGNOSIS — M2669 Other specified disorders of temporomandibular joint: Secondary | ICD-10-CM | POA: Diagnosis not present

## 2022-12-01 DIAGNOSIS — M5136 Other intervertebral disc degeneration, lumbar region: Secondary | ICD-10-CM | POA: Diagnosis not present

## 2022-12-01 DIAGNOSIS — M9902 Segmental and somatic dysfunction of thoracic region: Secondary | ICD-10-CM | POA: Diagnosis not present

## 2022-12-01 DIAGNOSIS — M50323 Other cervical disc degeneration at C6-C7 level: Secondary | ICD-10-CM | POA: Diagnosis not present

## 2022-12-01 DIAGNOSIS — M9903 Segmental and somatic dysfunction of lumbar region: Secondary | ICD-10-CM | POA: Diagnosis not present

## 2022-12-02 DIAGNOSIS — F3171 Bipolar disorder, in partial remission, most recent episode hypomanic: Secondary | ICD-10-CM | POA: Diagnosis not present

## 2022-12-08 DIAGNOSIS — H40013 Open angle with borderline findings, low risk, bilateral: Secondary | ICD-10-CM | POA: Diagnosis not present

## 2022-12-22 DIAGNOSIS — M50323 Other cervical disc degeneration at C6-C7 level: Secondary | ICD-10-CM | POA: Diagnosis not present

## 2022-12-22 DIAGNOSIS — M62830 Muscle spasm of back: Secondary | ICD-10-CM | POA: Diagnosis not present

## 2022-12-22 DIAGNOSIS — M9902 Segmental and somatic dysfunction of thoracic region: Secondary | ICD-10-CM | POA: Diagnosis not present

## 2022-12-22 DIAGNOSIS — M5136 Other intervertebral disc degeneration, lumbar region: Secondary | ICD-10-CM | POA: Diagnosis not present

## 2022-12-22 DIAGNOSIS — M9903 Segmental and somatic dysfunction of lumbar region: Secondary | ICD-10-CM | POA: Diagnosis not present

## 2022-12-22 DIAGNOSIS — M2669 Other specified disorders of temporomandibular joint: Secondary | ICD-10-CM | POA: Diagnosis not present

## 2022-12-24 DIAGNOSIS — M62830 Muscle spasm of back: Secondary | ICD-10-CM | POA: Diagnosis not present

## 2022-12-24 DIAGNOSIS — M5136 Other intervertebral disc degeneration, lumbar region: Secondary | ICD-10-CM | POA: Diagnosis not present

## 2022-12-24 DIAGNOSIS — M2669 Other specified disorders of temporomandibular joint: Secondary | ICD-10-CM | POA: Diagnosis not present

## 2022-12-24 DIAGNOSIS — M9902 Segmental and somatic dysfunction of thoracic region: Secondary | ICD-10-CM | POA: Diagnosis not present

## 2022-12-24 DIAGNOSIS — M9903 Segmental and somatic dysfunction of lumbar region: Secondary | ICD-10-CM | POA: Diagnosis not present

## 2022-12-24 DIAGNOSIS — M50323 Other cervical disc degeneration at C6-C7 level: Secondary | ICD-10-CM | POA: Diagnosis not present

## 2022-12-29 DIAGNOSIS — F3171 Bipolar disorder, in partial remission, most recent episode hypomanic: Secondary | ICD-10-CM | POA: Diagnosis not present

## 2023-01-07 DIAGNOSIS — M50323 Other cervical disc degeneration at C6-C7 level: Secondary | ICD-10-CM | POA: Diagnosis not present

## 2023-01-07 DIAGNOSIS — M9902 Segmental and somatic dysfunction of thoracic region: Secondary | ICD-10-CM | POA: Diagnosis not present

## 2023-01-07 DIAGNOSIS — M2669 Other specified disorders of temporomandibular joint: Secondary | ICD-10-CM | POA: Diagnosis not present

## 2023-01-07 DIAGNOSIS — M9903 Segmental and somatic dysfunction of lumbar region: Secondary | ICD-10-CM | POA: Diagnosis not present

## 2023-01-07 DIAGNOSIS — M5136 Other intervertebral disc degeneration, lumbar region: Secondary | ICD-10-CM | POA: Diagnosis not present

## 2023-01-07 DIAGNOSIS — M62830 Muscle spasm of back: Secondary | ICD-10-CM | POA: Diagnosis not present

## 2023-01-18 DIAGNOSIS — R69 Illness, unspecified: Secondary | ICD-10-CM | POA: Diagnosis not present

## 2023-01-19 DIAGNOSIS — M9903 Segmental and somatic dysfunction of lumbar region: Secondary | ICD-10-CM | POA: Diagnosis not present

## 2023-01-19 DIAGNOSIS — M9902 Segmental and somatic dysfunction of thoracic region: Secondary | ICD-10-CM | POA: Diagnosis not present

## 2023-01-19 DIAGNOSIS — M62830 Muscle spasm of back: Secondary | ICD-10-CM | POA: Diagnosis not present

## 2023-01-19 DIAGNOSIS — M5136 Other intervertebral disc degeneration, lumbar region: Secondary | ICD-10-CM | POA: Diagnosis not present

## 2023-01-19 DIAGNOSIS — M2669 Other specified disorders of temporomandibular joint: Secondary | ICD-10-CM | POA: Diagnosis not present

## 2023-01-19 DIAGNOSIS — M50323 Other cervical disc degeneration at C6-C7 level: Secondary | ICD-10-CM | POA: Diagnosis not present

## 2023-01-20 DIAGNOSIS — G4700 Insomnia, unspecified: Secondary | ICD-10-CM | POA: Diagnosis not present

## 2023-01-20 DIAGNOSIS — F900 Attention-deficit hyperactivity disorder, predominantly inattentive type: Secondary | ICD-10-CM | POA: Diagnosis not present

## 2023-01-20 DIAGNOSIS — F4321 Adjustment disorder with depressed mood: Secondary | ICD-10-CM | POA: Diagnosis not present

## 2023-01-20 DIAGNOSIS — F3181 Bipolar II disorder: Secondary | ICD-10-CM | POA: Diagnosis not present

## 2023-01-27 DIAGNOSIS — F3171 Bipolar disorder, in partial remission, most recent episode hypomanic: Secondary | ICD-10-CM | POA: Diagnosis not present

## 2023-02-05 DIAGNOSIS — M9901 Segmental and somatic dysfunction of cervical region: Secondary | ICD-10-CM | POA: Diagnosis not present

## 2023-02-05 DIAGNOSIS — M9903 Segmental and somatic dysfunction of lumbar region: Secondary | ICD-10-CM | POA: Diagnosis not present

## 2023-02-05 DIAGNOSIS — M62830 Muscle spasm of back: Secondary | ICD-10-CM | POA: Diagnosis not present

## 2023-02-05 DIAGNOSIS — M2669 Other specified disorders of temporomandibular joint: Secondary | ICD-10-CM | POA: Diagnosis not present

## 2023-02-05 DIAGNOSIS — M5136 Other intervertebral disc degeneration, lumbar region: Secondary | ICD-10-CM | POA: Diagnosis not present

## 2023-02-05 DIAGNOSIS — M50322 Other cervical disc degeneration at C5-C6 level: Secondary | ICD-10-CM | POA: Diagnosis not present

## 2023-02-05 DIAGNOSIS — M50323 Other cervical disc degeneration at C6-C7 level: Secondary | ICD-10-CM | POA: Diagnosis not present

## 2023-02-05 DIAGNOSIS — M961 Postlaminectomy syndrome, not elsewhere classified: Secondary | ICD-10-CM | POA: Diagnosis not present

## 2023-02-08 DIAGNOSIS — I10 Essential (primary) hypertension: Secondary | ICD-10-CM | POA: Diagnosis not present

## 2023-02-08 DIAGNOSIS — K1120 Sialoadenitis, unspecified: Secondary | ICD-10-CM | POA: Diagnosis not present

## 2023-02-08 DIAGNOSIS — R600 Localized edema: Secondary | ICD-10-CM | POA: Diagnosis not present

## 2023-02-23 ENCOUNTER — Ambulatory Visit: Payer: Medicare HMO | Admitting: Family Medicine

## 2023-02-23 ENCOUNTER — Ambulatory Visit (INDEPENDENT_AMBULATORY_CARE_PROVIDER_SITE_OTHER): Payer: Medicare HMO

## 2023-02-23 VITALS — BP 102/68 | HR 78 | Ht 66.0 in | Wt 146.0 lb

## 2023-02-23 DIAGNOSIS — M79672 Pain in left foot: Secondary | ICD-10-CM | POA: Diagnosis not present

## 2023-02-23 DIAGNOSIS — M25551 Pain in right hip: Secondary | ICD-10-CM

## 2023-02-23 DIAGNOSIS — M5136 Other intervertebral disc degeneration, lumbar region with discogenic back pain only: Secondary | ICD-10-CM | POA: Diagnosis not present

## 2023-02-23 DIAGNOSIS — G8929 Other chronic pain: Secondary | ICD-10-CM

## 2023-02-23 DIAGNOSIS — Z0100 Encounter for examination of eyes and vision without abnormal findings: Secondary | ICD-10-CM | POA: Diagnosis not present

## 2023-02-23 DIAGNOSIS — M545 Low back pain, unspecified: Secondary | ICD-10-CM | POA: Diagnosis not present

## 2023-02-23 DIAGNOSIS — M1611 Unilateral primary osteoarthritis, right hip: Secondary | ICD-10-CM | POA: Diagnosis not present

## 2023-02-23 DIAGNOSIS — M79671 Pain in right foot: Secondary | ICD-10-CM

## 2023-02-23 DIAGNOSIS — Z981 Arthrodesis status: Secondary | ICD-10-CM | POA: Diagnosis not present

## 2023-02-23 NOTE — Patient Instructions (Addendum)
Thank you for coming in today.   Please get an Xray today before you leave   I've referred you to Physical Therapy.  Let us know if you don't hear from them in one week.   Check back in 6 weeks 

## 2023-02-23 NOTE — Progress Notes (Signed)
Rubin Payor, PhD, LAT, ATC acting as a scribe for [name], MD.  Whitney Lewis is a 62 y.o. female who presents to [facility] at [facility] today for f/u bilat foot pain. Pt was previously seen in 2023 for a L 5th MT fx and R lateral hip pain. She has a lift in her R shoe. She notes hx of surgery in both of her feet. Hx of leg length discrepancy. She c/o pain in lateral aspect of her R hip, into her buttock, and R-side of her low back.    Pertinent review of systems: No fevers or chills  Relevant historical information: History of foot osteomyelitis requiring surgery.  History of lumbar fusion.   Exam:  BP 102/68   Pulse 78   Ht 5\' 6"  (1.676 m)   Wt 146 lb (66.2 kg)   SpO2 98%   BMI 23.57 kg/m  General: Well Developed, well nourished, and in no acute distress.   MSK: Right hip normal-appearing Normal motion. Mildly tender palpation greater trochanter.  Hip abduction and external rotation strength are generally intact. Contralateral left hip abduction external rotation are mildly diminished 4/5. Normal gait. Patient does have a leg length discrepancy measuring about 1 cm right leg shorter than left.  By measuring the distance between the medial malleoli when patient is laying and sitting.  However measured leg lengths from the ASIS to the medial malleolus is less than 1 cm.    Lab and Radiology Results  X-ray images lumbar spine and right hip obtained today personally and independently interpreted  Lumbar spine: Posterior metallic fusion at L4-5 intact appearing without hardware failure.  DDD L5-S1 is present.  No significant scoliosis is present.  Right hip: Minimal enthesiopathy change at lateral greater trochanter.  No acute fractures.  No significant hip OA.  Bilateral SI joint is normal-appearing.  Await formal radiology review     Assessment and Plan: 62 y.o. female with right lateral hip pain.  This is an acute exacerbation of a  chronic problem that when last evaluated about a year and a half ago and was doing pretty well following physical therapy.  Her pain has slowly returned.  She is adding more leg length correction to her right leg now totaling about 1 cm to help reduce her pain and even that is not working well enough.  She does have pretty okay strength on the right but I fear it may not be strong enough for what she is asking her body to do.  Plan for trial of physical therapy and recheck in about 6 weeks.  If not improved consider advanced imaging.  She may also benefit from a full-length leg length correction involving the entire sole of her shoe and not just the heel.   PDMP not reviewed this encounter. Orders Placed This Encounter  Procedures   DG HIP UNILAT W OR W/O PELVIS 2-3 VIEWS RIGHT    Standing Status:   Future    Number of Occurrences:   1    Standing Expiration Date:   03/26/2023    Order Specific Question:   Reason for Exam (SYMPTOM  OR DIAGNOSIS REQUIRED)    Answer:   right hip pain    Order Specific Question:   Preferred imaging location?    Answer:   [facility]   DG Lumbar Spine 2-3 Views    Standing Status:   Future    Number of Occurrences:   1  Standing Expiration Date:   03/26/2023    Order Specific Question:   Reason for Exam (SYMPTOM  OR DIAGNOSIS REQUIRED)    Answer:   low back pain    Order Specific Question:   Preferred imaging location?    Answer:   [facility]   Ambulatory referral to Physical Therapy    Referral Priority:   Routine    Referral Type:   Physical Medicine    Referral Reason:   Specialty Services Required    Requested Specialty:   Physical Therapy    Number of Visits Requested:   1   No orders of the defined types were placed in this encounter.    Discussed warning signs or symptoms. Please see discharge instructions. Patient expresses understanding.   The above documentation has been reviewed and is accurate and complete [name],  M.D.

## 2023-02-24 DIAGNOSIS — M25551 Pain in right hip: Secondary | ICD-10-CM | POA: Diagnosis not present

## 2023-02-24 DIAGNOSIS — J329 Chronic sinusitis, unspecified: Secondary | ICD-10-CM | POA: Diagnosis not present

## 2023-02-24 DIAGNOSIS — M545 Low back pain, unspecified: Secondary | ICD-10-CM | POA: Diagnosis not present

## 2023-02-24 DIAGNOSIS — I8390 Asymptomatic varicose veins of unspecified lower extremity: Secondary | ICD-10-CM | POA: Diagnosis not present

## 2023-02-24 DIAGNOSIS — M19049 Primary osteoarthritis, unspecified hand: Secondary | ICD-10-CM | POA: Diagnosis not present

## 2023-02-24 DIAGNOSIS — H04129 Dry eye syndrome of unspecified lacrimal gland: Secondary | ICD-10-CM | POA: Diagnosis not present

## 2023-02-24 DIAGNOSIS — F3171 Bipolar disorder, in partial remission, most recent episode hypomanic: Secondary | ICD-10-CM | POA: Diagnosis not present

## 2023-02-24 DIAGNOSIS — K580 Irritable bowel syndrome with diarrhea: Secondary | ICD-10-CM | POA: Diagnosis not present

## 2023-03-01 ENCOUNTER — Ambulatory Visit: Payer: Medicare HMO | Admitting: Physical Therapy

## 2023-03-01 DIAGNOSIS — R2689 Other abnormalities of gait and mobility: Secondary | ICD-10-CM

## 2023-03-01 DIAGNOSIS — M25552 Pain in left hip: Secondary | ICD-10-CM | POA: Diagnosis not present

## 2023-03-01 DIAGNOSIS — M5459 Other low back pain: Secondary | ICD-10-CM

## 2023-03-01 DIAGNOSIS — M25551 Pain in right hip: Secondary | ICD-10-CM | POA: Diagnosis not present

## 2023-03-01 NOTE — Therapy (Unsigned)
OUTPATIENT PHYSICAL THERAPY THORACOLUMBAR EVALUATION   Patient Name: Whitney Lewis MRN: [id number] DOB:1961-02-11, 62 y.o., female Today's Date: 03/01/2023     Past Medical History:  Diagnosis Date   ADD (attention deficit disorder)    Anxiety    Arthritis    feet, knees (07/04/2015)   Bipolar disorder (HCC)    Complication of anesthesia    hard to wake up, blood pressure drops    Depression    Family history of adverse reaction to anesthesia    "sister hard to wake up also"   Fibromyalgia dx'd ~ 2000   GERD (gastroesophageal reflux disease)    Hyperlipidemia    Hypertension    Migraine    "hormonal; stopped when I was 16" (07/04/2015)   OSA (obstructive sleep apnea)    wears mouth piece, no CPAP   Seizures (HCC)    Past Surgical History:  Procedure Laterality Date   ABDOMINAL HYSTERECTOMY  ~ 1993   BACK SURGERY     CESAREAN SECTION  1987   COLONOSCOPY     COLONOSCOPY WITH PROPOFOL N/A 09/21/2017   Procedure: COLONOSCOPY WITH PROPOFOL;  Surgeon: Christena Deem, MD;  Location: [facility] ENDOSCOPY;  Service: Endoscopy;  Laterality: N/A;   HAMMER TOE SURGERY Right 03/2015   HAMMER TOE SURGERY Left 06/06/2015   Procedure: LEFT TWO-THREE  HAMMER TOE CORRECTION ;  Surgeon: Toni Arthurs, MD;  Location: [facility];  Service: Orthopedics;  Laterality: Left;   INCISION AND DRAINAGE OF WOUND Right 07/04/2015   Procedure: IRRIGATION AND DEBRIDEMENT OF RIGHT FOOT DEEP ABSCESS; EXCISION OF RIGHT SECOND METATARSAL HEAD; REMOVAL OF DEEP IMPLANT RIGHT FOOT; EXCISION OF RIGHT SECOND TOE PROXIMAL PHALANX BASE; INSERTION OF ANTIBIOTIC BEADS INTO RIGHT FOOT ABSCESS;  Surgeon: Toni Arthurs, MD;  Location: [facility] OR;  Service: Orthopedics;  Laterality: Right;  SITE ALSO  RIGHT FOOT   IRRIGATION AND DEBRIDEMENT FOOT Right 07/04/2015   Irrigation and excisional debridement of right foot deep abscess;; Insertion of antibiotic beads into the right foot abscess   JOINT REPLACEMENT     MAXIMUM  ACCESS (MAS)POSTERIOR LUMBAR INTERBODY FUSION (PLIF) 1 LEVEL N/A 01/01/2017   Procedure: L4-5 MAXIMUM ACCESS (MAS) POSTERIOR LUMBAR INTERBODY FUSION (PLIF);  Surgeon: Tia Alert, MD;  Location: [facility] OR;  Service: Neurosurgery;  Laterality: N/A;   TOTAL KNEE ARTHROPLASTY Left    WEIL OSTEOTOMY Left 06/06/2015   Procedure: LEFT TWO AND THREE METATARSAL WEIL OSTEOTOMY ;  Surgeon: Toni Arthurs, MD;  Location: [facility];  Service: Orthopedics;  Laterality: Left;   Patient Active Problem List   Diagnosis Date Noted   Metatarsal fracture 09/11/2021   S/P lumbar spinal fusion 01/01/2017   ADD (attention deficit disorder) 07/17/2015   Bipolar affective disorder (HCC) 07/17/2015   HLD (hyperlipidemia) 07/17/2015   BP (high blood pressure) 07/17/2015   Obstructive apnea 07/17/2015   Staphylococcus aureus infection    Foot osteomyelitis, right (HCC) 07/04/2015    PCP: Alysia Penna, MD  REFERRING PROVIDER: [name]  REFERRING DIAG: R hip pain  THERAPY DIAG:  No diagnosis found.  ONSET DATE:  1 month ago.   SUBJECTIVE:  SUBJECTIVE STATEMENT:  03/01/2023   *Going to [facility]- trainer,  Wearing heel lift On R shoe. R low back and sometimes into R hip lateral hip/pain,    and also in front of L hip. Shooting pain into L thigh that makes leg buckle.   Training for 6 week, feeling better some.   R side lumbar muscles very tight.  Limited lumbar extension, and limited SB,   R glute and lateral hip R pain,  Standing: all weight on R hip/back, flight flexion, and L knee in flexion.  Significant stiffness in L hip for IR and ext, mod for extension  L hip seems to be main limitation, can't do lumbar extension due to hip mobility,  Feet dont hurt, knees dont hurt. *   L hip has been sore in the  last year. She did have cortisone injection in January- did help. Back has also been much more sore now in the last couple weeks.  She has extensive surgical history for feet/toes with multiple surgeries for bunions and hammer toes.  Thinks R LE is shorter.  Used to walk 3-5 mi/day. Now can not due to pain  Still having some issues with foot pain, L 5th , and R 2nd.     PERTINENT HISTORY:  L TKA,  Lumbar fustonL4/5 ,   PAIN:  Are you having pain? Yes NPRS scale:6-7 /10 Pain location: Hip and low back  Pain orientation: Right  PAIN TYPE: aching Pain description: intermittent  Aggravating factors: sleeping, standing, walking, lifitng leg Relieving factors: rest     PRECAUTIONS: None  WEIGHT BEARING RESTRICTIONS No  FALLS:  Has patient fallen in last 6 months? No, Number of falls: 0  OCCUPATION: Retired,   PLOF: Independent  PATIENT GOALS  Return to regular exercise, walking.    OBJECTIVE:   DIAGNOSTIC FINDINGS: none for hip or back.   COGNITION:  Overall cognitive status: Within functional limits for tasks assessed  POSTURE:  Standing: R hip lower, 2nd toe with some clawing/extension, wide forefoot;   Supine: R LE shorter,   PALPATION: L foot: soreness with light touch and palpation of top of 5th toe R foot: pain with palpation of plantar  2-3 region, 2nd toe with some clawing/extension- possible plantar plate pathology, also has palpable area, likely hardware on dorsal surface-may require further assessment from foot MD.  R hip: tenderness in posterior gr troch, into glute, pain in R SI   ROM AROM-   Lumbar: WFL,  Hips: mild limitation for R flexion/ rotation,  L knee: lacking full flexion (from previous TKA) ; Limited DF bil;    MMT: Hips: 4/5 (pain with testing on R),  Knees: 4+5,    LUMBAR SPECIAL TESTS:  Neg SLR bil;    GAIT: Decreased hip ext on R with decreased terminal stance position and DF; mild increase in hip /lumbar rotation on R with terminal  stance;     TODAY'S TREATMENT  See below for HEP   PATIENT EDUCATION:  Education details: PT POC, Exam findings, HEP Person educated: Patient Education method: Explanation, Demonstration, Tactile cues, Verbal cues, and Handouts Education comprehension: verbalized understanding, returned demonstration, verbal cues required, tactile cues required, and needs further education   HOME EXERCISE PROGRAM: Access Code: [email or web address] URL: [email or web address] Date: 07/09/2021 Prepared by: Sedalia Muta  Exercises Single Knee to Chest Stretch - 2 x daily - 3 reps - 30 hold Supine Lower Trunk Rotation - 2 x daily - 10 reps - 5 hold Supine Figure 4  Piriformis Stretch - 2 x daily - 3 reps - 30 hold Supine Figure 4 Piriformis Stretch with Leg Extension - 2 x daily - 3 reps - 30 hold Sidelying Hip Abduction - 2 x daily - 2 sets - 10 reps   ASSESSMENT:  CLINICAL IMPRESSION: Pt presents with primary complaint of increased pain in R hip. She has pain and tenderness in R SI and into R glute. She has mild leg length discrepancy, with R being shorter than L. She has had multiple foot surgeries for toes. She has weakness and instability in feet and toes, that are causing some gait deficits and may be causing some proximal instability(hip pain)  as well. Pt to benefit from education on strengthening for foot/ankle, as well as education on optimal footwear, and possible heel lift for leg length. Pt to benefit from strengthening, stability and manual for R hip pain. Pt to benefit from skilled PT to improve deficits and pain.    OBJECTIVE IMPAIRMENTS Abnormal gait, decreased activity tolerance, decreased balance, decreased knowledge of use of DME, decreased mobility, difficulty walking, decreased ROM, decreased strength, increased muscle spasms, impaired flexibility, improper body mechanics, and pain.   ACTIVITY LIMITATIONS cleaning, community activity, meal prep, laundry, and yard work.    PERSONAL FACTORS Past/current experiences /foot surgeries and deficits are also affecting patient's functional outcome.    REHAB POTENTIAL: Good  CLINICAL DECISION MAKING: Stable/uncomplicated  EVALUATION COMPLEXITY: Low   GOALS: Goals reviewed with patient? Yes  SHORT TERM GOALS:  STG Name Target Date Goal status  1 Patient will be independent with initial HEP   07/23/21 INITIAL              LONG TERM GOALS:   LTG Name Target Date Goal status  1 Patient will be independent with final HEP  08/20/21 INITIAL  2 Pt to report decreased pain in R hip to be to 0-2/10 with standing activity   08/20/21 INITIAL  3 Pt to demonstrate improved strength of R hip to be at least 4+/5, to improve ability for gait and stairs   08/20/21 INITIAL  4 Pt to demonstrate optimal mechanics with hips and feet during gait cycle, with foot and hip stability WNL   08/20/21 INITIAL  5 Pt to be compliant with optimal footwear for foot type and diagnosis.  08/20/21 INITIAL     PLAN: PT FREQUENCY: 1-2x/week  PT DURATION: 6 weeks  PLANNED INTERVENTIONS: Therapeutic exercises, Therapeutic activity, Neuro Muscular re-education, Balance training, Gait training, Patient/Family education, Joint mobilization, Stair training, DME instructions, Dry Needling, Electrical stimulation, Spinal mobilization, Moist heat, Taping, Traction, Ultrasound, Ionotophoresis 4mg /ml Dexamethasone, and Manual therapy  PLAN FOR NEXT SESSION:   Sedalia Muta, PT, DPT 1:53 PM  03/01/23

## 2023-03-02 ENCOUNTER — Ambulatory Visit: Payer: PPO | Admitting: Physical Therapy

## 2023-03-03 ENCOUNTER — Encounter: Payer: Self-pay | Admitting: Physical Therapy

## 2023-03-04 ENCOUNTER — Encounter: Payer: Medicare HMO | Admitting: Physical Therapy

## 2023-03-08 DIAGNOSIS — H34812 Central retinal vein occlusion, left eye, with macular edema: Secondary | ICD-10-CM | POA: Diagnosis not present

## 2023-03-09 ENCOUNTER — Other Ambulatory Visit: Payer: Self-pay | Admitting: Family Medicine

## 2023-03-09 DIAGNOSIS — H348122 Central retinal vein occlusion, left eye, stable: Secondary | ICD-10-CM

## 2023-03-09 DIAGNOSIS — H04129 Dry eye syndrome of unspecified lacrimal gland: Secondary | ICD-10-CM | POA: Diagnosis not present

## 2023-03-10 ENCOUNTER — Ambulatory Visit: Payer: Medicare HMO | Admitting: Physical Therapy

## 2023-03-10 ENCOUNTER — Encounter: Payer: Self-pay | Admitting: Physical Therapy

## 2023-03-10 DIAGNOSIS — M25552 Pain in left hip: Secondary | ICD-10-CM | POA: Diagnosis not present

## 2023-03-10 DIAGNOSIS — M25551 Pain in right hip: Secondary | ICD-10-CM | POA: Diagnosis not present

## 2023-03-10 DIAGNOSIS — M5459 Other low back pain: Secondary | ICD-10-CM

## 2023-03-10 DIAGNOSIS — R2689 Other abnormalities of gait and mobility: Secondary | ICD-10-CM | POA: Diagnosis not present

## 2023-03-10 DIAGNOSIS — I6523 Occlusion and stenosis of bilateral carotid arteries: Secondary | ICD-10-CM | POA: Diagnosis not present

## 2023-03-10 NOTE — Therapy (Signed)
OUTPATIENT PHYSICAL THERAPY TREATMENT   Patient Name: Whitney Lewis MRN: [id number] DOB:Mar 04, 1961, 62 y.o., female Today's Date: 03/10/2023   PT End of Session - 03/10/23 1319     Visit Number 2    Number of Visits 16    Date for PT Re-Evaluation 04/26/23    Authorization Type [employer]    PT Start Time 1300    PT Stop Time 1345    PT Time Calculation (min) 45 min    Behavior During Therapy [facility] for tasks assessed/performed               Past Medical History:  Diagnosis Date   ADD (attention deficit disorder)    Anxiety    Arthritis    feet, knees (07/04/2015)   Bipolar disorder (HCC)    Complication of anesthesia    hard to wake up, blood pressure drops    Depression    Family history of adverse reaction to anesthesia    "sister hard to wake up also"   Fibromyalgia dx'd ~ 2000   GERD (gastroesophageal reflux disease)    Hyperlipidemia    Hypertension    Migraine    "hormonal; stopped when I was 16" (07/04/2015)   OSA (obstructive sleep apnea)    wears mouth piece, no CPAP   Seizures (HCC)    Past Surgical History:  Procedure Laterality Date   ABDOMINAL HYSTERECTOMY  ~ 1993   BACK SURGERY     CESAREAN SECTION  1987   COLONOSCOPY     COLONOSCOPY WITH PROPOFOL N/A 09/21/2017   Procedure: COLONOSCOPY WITH PROPOFOL;  Surgeon: Christena Deem, MD;  Location: [facility] ENDOSCOPY;  Service: Endoscopy;  Laterality: N/A;   HAMMER TOE SURGERY Right 03/2015   HAMMER TOE SURGERY Left 06/06/2015   Procedure: LEFT TWO-THREE  HAMMER TOE CORRECTION ;  Surgeon: Toni Arthurs, MD;  Location: [facility];  Service: Orthopedics;  Laterality: Left;   INCISION AND DRAINAGE OF WOUND Right 07/04/2015   Procedure: IRRIGATION AND DEBRIDEMENT OF RIGHT FOOT DEEP ABSCESS; EXCISION OF RIGHT SECOND METATARSAL HEAD; REMOVAL OF DEEP IMPLANT RIGHT FOOT; EXCISION OF RIGHT SECOND TOE PROXIMAL PHALANX BASE; INSERTION OF ANTIBIOTIC BEADS INTO RIGHT FOOT ABSCESS;  Surgeon: Toni Arthurs, MD;  Location: [facility] OR;  Service: Orthopedics;  Laterality: Right;  SITE ALSO  RIGHT FOOT   IRRIGATION AND DEBRIDEMENT FOOT Right 07/04/2015   Irrigation and excisional debridement of right foot deep abscess;; Insertion of antibiotic beads into the right foot abscess   JOINT REPLACEMENT     MAXIMUM ACCESS (MAS)POSTERIOR LUMBAR INTERBODY FUSION (PLIF) 1 LEVEL N/A 01/01/2017   Procedure: L4-5 MAXIMUM ACCESS (MAS) POSTERIOR LUMBAR INTERBODY FUSION (PLIF);  Surgeon: Tia Alert, MD;  Location: [facility] OR;  Service: Neurosurgery;  Laterality: N/A;   TOTAL KNEE ARTHROPLASTY Left    WEIL OSTEOTOMY Left 06/06/2015   Procedure: LEFT TWO AND THREE METATARSAL WEIL OSTEOTOMY ;  Surgeon: Toni Arthurs, MD;  Location: [facility];  Service: Orthopedics;  Laterality: Left;   Patient Active Problem List   Diagnosis Date Noted   Metatarsal fracture 09/11/2021   S/P lumbar spinal fusion 01/01/2017   ADD (attention deficit disorder) 07/17/2015   Bipolar affective disorder (HCC) 07/17/2015   HLD (hyperlipidemia) 07/17/2015   BP (high blood pressure) 07/17/2015   Obstructive apnea 07/17/2015   Staphylococcus aureus infection    Foot osteomyelitis, right (HCC) 07/04/2015    PCP: Alysia Penna, MD  REFERRING PROVIDER: [name]  REFERRING DIAG: R  hip pain, low back pain   THERAPY DIAG:  Pain in right hip  Pain in left hip  Other low back pain  Other abnormalities of gait and mobility  ONSET DATE:    SUBJECTIVE:                                                                                                                                                                                           SUBJECTIVE STATEMENT:  03/10/2023  Pt with new diagnosis of Retinal vein occlusion, thinks happened 3-4 weeks ago, but just saw MD for diagnosis. L eye vision decreased, getting better, but is blurry. . Having other testing, carotid US, etc.    03/01/2023:   Eval:   Pt states worsening  discomfort in low back and R hip. And some pain in L hip. She feels very "off". She reports no pain in feet. She has been wearing Heel lift in R shoe since she was seen previously in PT. Now, without it, if she wears different shoes, she feels very uneven. Having a hard time finding shoes besides her sneakers that she can wear. She has been dong to [employer] with a trainer, and feels it has been helpful.  Pain: states Shooting pain into L thigh that makes leg buckle at times, intermittent. That is new. (Has had pain in L hip in the past)  Most pain is in R lateral and posterior hip,  and into bil low back/ bil glute region.   PERTINENT HISTORY:  L TKA,  Lumbar fusion L4/5 , previous foot and toe surgeries  PAIN:  Are you having pain? Yes NPRS scale: 6-7 /10 Pain location: R Hip and low back  Pain orientation: Right  PAIN TYPE: sore, painful Pain description: intermittent  Aggravating factors:  standing, walking, activity Relieving factors: rest    PRECAUTIONS: None  WEIGHT BEARING RESTRICTIONS No  FALLS:  Has patient fallen in last 6 months? No, Number of falls: 0  OCCUPATION: Retired,   PLOF: Independent  PATIENT GOALS  decreased pain in hip, back, be able to do more activity without pain.  Feel more "even" with other shoes .    OBJECTIVE:   DIAGNOSTIC FINDINGS: waiting on read for back and R hip.    COGNITION:  Overall cognitive status: Within functional limits for tasks assessed   POSTURE:  Standing: L knee in slight flexion, Increased weight bearing on R hip, slight fwd flexion of trunk.   Unable to achieve full upright trunk posture, some due to L hip stiffness.  Supine: R LE shorter,    PALPATION: R hip: tenderness in posterior and lateral  hip, bil glutes, R SI,  Significant limitation and hypomobility for L hip for ext, IR, flex, ER,  Mild limitation for L knee flex and ext (baseline from prev surgery)  Lumbar extension limited secondary to L hip extension  limitation.    ROM AROM-    Lumbar: extension limited, secondary to L hip extension limitation L knee: lacking full flexion and extension (from previous TKA) ; Limited DF bil;  L hip: significant limitation for ext>IR>flex>ER.    MMT: Knees: 4+5,  Hips: 4-/5 on L,   4/5 on R   LUMBAR SPECIAL TESTS:  Neg SLR bil;    GAIT: Decreased hip ext on L with decreased terminal stance position    TODAY'S TREATMENT   03/10/2023 Therapeutic Exercise: Aerobic: Supine: SKTC 30 sec x 5;  Hip ER fallouts x 10, 5 sec;  Hip IR ROM x 10 bil;  Prone: prone on elbows stretch- for anterior hip stretch - reviewed for HEP Seated: Standing: Stretches:  Neuromuscular Re-education: Manual Therapy: Hip Inf mobs,  Long leg distraction for L hip;  PROM for L hip, all motions.  Therapeutic Activity: Self Care:    PATIENT EDUCATION:  Education details: PT POC, Exam findings,  Person educated: Patient Education method: Explanation, Demonstration, Tactile cues, Verbal cues, and Handouts Education comprehension: verbalized understanding, returned demonstration, verbal cues required, tactile cues required, and needs further education   HOME EXERCISE PROGRAM: Access Code: [email or web address](previous) Access Code: [email or web address]  new URL: [email or web address] Date: 03/10/2023 Prepared by: Sedalia Muta  Exercises - Hooklying Single Knee to Chest Stretch  - 2 x daily - 3 reps - 30 hold - Bent Knee Fallouts  - 2 x daily - 1 sets - 10 reps - 3-5 hold - Supine Bilateral Hip Internal Rotation Stretch  - 2 x daily - 1 sets - 10 reps - 3 hold - Supine Lower Trunk Rotation  - 2 x daily - 10 reps - 5 hold - Prone on Elbows Stretch    ASSESSMENT:  CLINICAL IMPRESSION: Focus for L hip mobility today and education on initial HEP for L hip mobility. Pt with significant stiffness with all motions. Plan to progress as tolerated.   Eval:  Pt presents with primary complaint of pain in R hip and bil low  back. She has inability for full upright posture, seemingly due to significant stiffness noted in L hip. She has significant limitation for L hip ROM, and is standing with slight flexion in L hip and knee, with increased weight bearing/shift onto R side. She has increased pain in R hip, bil glutes, and increased tightness in R lumbar region.  She has mild leg length discrepancy, with R being shorter than L, which does seems to be effecting her quite a bit. She has had multiple foot surgeries for toes, but is not having pain in feet at this time. We will discuss optimal footwear, and possible need for shoe lift for other/dress shoes besides sneakers so she can wear this more /full time. She has quite a bit of asymmetry effecting posture and pain. Will focus on L hip mobility, improving lumbar posture, for decreasing back pain and hip pain, as well as improving gait mechanics. Pt with decreased ability for full functional activities and will benefit from skilled PT to improve.    OBJECTIVE IMPAIRMENTS Abnormal gait, decreased activity tolerance, decreased balance, decreased knowledge of use of DME, decreased mobility, difficulty walking, decreased ROM, decreased strength, increased muscle spasms, impaired flexibility, improper body mechanics, and pain.  ACTIVITY LIMITATIONS meal prep, cleaning, laundry, shopping, community activity, and yard work.   PERSONAL FACTORS Past/current experiences /foot surgeries, L hip stiffness, leg length discrepancy, are effecting pts outcome.  REHAB POTENTIAL: Good  CLINICAL DECISION MAKING: Evolving/moderate complexity  EVALUATION COMPLEXITY: Moderate   GOALS: Goals reviewed with patient? Yes  SHORT TERM GOALS: Target date: 03/15/2023  Patient to be independent with initial HEP  Goal status: INITIAL    LONG TERM GOALS: Target date: 04/25/2023  Patient to be independent with final HEP  Goal status: INITIAL  2.  Patient to demo improved ROM of L hip to  be at max ability, to improve ability for more upright posture and gait.   Goal status: INITIAL  3.  Patient to demo improved strength of  bil hips to 4+5,  to improve stability and pain  Goal status: INITIAL   4.  Pt to obtain and be complaint with footwear/heel lift as appropriate for her leg length discrepancy, to allow for improved gait pattern.   Goal status: INITIAL    PLAN: PT FREQUENCY: 1-2x/week  PT DURATION: 8 weeks  PLANNED INTERVENTIONS: Therapeutic exercises, Therapeutic activity, Neuromuscular re-education, Balance training, Gait training, Patient/Family education, Self Care, Joint mobilization, Joint manipulation, Stair training, Orthotic/Fit training, DME instructions, Dry Needling, Electrical stimulation, Spinal mobilization, Moist heat, Taping, Traction, Ultrasound, Ionotophoresis 4mg /ml Dexamethasone, Manual therapy, and Neuro Muscular re-education  PLAN FOR NEXT SESSION:  L hip mobility, manual for bil glute and low back pain, shoe lift recommendations,    Sedalia Muta, PT, DPT 1:52 PM  03/10/23

## 2023-03-11 DIAGNOSIS — H34812 Central retinal vein occlusion, left eye, with macular edema: Secondary | ICD-10-CM | POA: Diagnosis not present

## 2023-03-11 DIAGNOSIS — H35033 Hypertensive retinopathy, bilateral: Secondary | ICD-10-CM | POA: Diagnosis not present

## 2023-03-11 DIAGNOSIS — H43813 Vitreous degeneration, bilateral: Secondary | ICD-10-CM | POA: Diagnosis not present

## 2023-03-15 ENCOUNTER — Ambulatory Visit: Payer: Medicare HMO | Admitting: Physical Therapy

## 2023-03-15 ENCOUNTER — Encounter: Payer: Self-pay | Admitting: Physical Therapy

## 2023-03-15 DIAGNOSIS — R2689 Other abnormalities of gait and mobility: Secondary | ICD-10-CM | POA: Diagnosis not present

## 2023-03-15 DIAGNOSIS — M25551 Pain in right hip: Secondary | ICD-10-CM | POA: Diagnosis not present

## 2023-03-15 DIAGNOSIS — M25552 Pain in left hip: Secondary | ICD-10-CM

## 2023-03-15 DIAGNOSIS — M5459 Other low back pain: Secondary | ICD-10-CM | POA: Diagnosis not present

## 2023-03-15 NOTE — Progress Notes (Signed)
Lumbar spine x-ray shows a good looking fusion at L4-5 but some arthritis above the level of the fusion at L3-4.

## 2023-03-15 NOTE — Therapy (Signed)
OUTPATIENT PHYSICAL THERAPY TREATMENT   Patient Name: [place] L Curto MRN: [id number] DOB:[birth year], 62 y.o., female Today's Date: 03/15/2023   PT End of Session - 03/15/23 1228     Visit Number 3    Number of Visits 16    Date for PT Re-Evaluation 04/26/23    Authorization Type [employer] Medicare    PT Start Time 1221    PT Stop Time 1302    PT Time Calculation (min) 41 min    Activity Tolerance Patient tolerated treatment well    Behavior During Therapy [facility] for tasks assessed/performed               Past Medical History:  Diagnosis Date   ADD (attention deficit disorder)    Anxiety    Arthritis    feet, knees (07/04/2015)   Bipolar disorder (HCC)    Complication of anesthesia    hard to wake up, blood pressure drops    Depression    Family history of adverse reaction to anesthesia    "sister hard to wake up also"   Fibromyalgia dx'd ~ 2000   GERD (gastroesophageal reflux disease)    Hyperlipidemia    Hypertension    Migraine    "hormonal; stopped when I was 16" (07/04/2015)   OSA (obstructive sleep apnea)    wears mouth piece, no CPAP   Seizures (HCC)    Past Surgical History:  Procedure Laterality Date   ABDOMINAL HYSTERECTOMY  ~ 1993   BACK SURGERY     CESAREAN SECTION  1987   COLONOSCOPY     COLONOSCOPY WITH PROPOFOL N/A 09/21/2017   Procedure: COLONOSCOPY WITH PROPOFOL;  Surgeon: Christena Deem, MD;  Location: [facility] ENDOSCOPY;  Service: Endoscopy;  Laterality: N/A;   HAMMER TOE SURGERY Right 03/2015   HAMMER TOE SURGERY Left 06/06/2015   Procedure: LEFT TWO-THREE  HAMMER TOE CORRECTION ;  Surgeon: Toni Arthurs, MD;  Location: [facility];  Service: Orthopedics;  Laterality: Left;   INCISION AND DRAINAGE OF WOUND Right 07/04/2015   Procedure: IRRIGATION AND DEBRIDEMENT OF RIGHT FOOT DEEP ABSCESS; EXCISION OF RIGHT SECOND METATARSAL HEAD; REMOVAL OF DEEP IMPLANT RIGHT FOOT; EXCISION OF RIGHT SECOND TOE PROXIMAL PHALANX BASE; INSERTION OF  ANTIBIOTIC BEADS INTO RIGHT FOOT ABSCESS;  Surgeon: Toni Arthurs, MD;  Location: [facility] OR;  Service: Orthopedics;  Laterality: Right;  SITE ALSO  RIGHT FOOT   IRRIGATION AND DEBRIDEMENT FOOT Right 07/04/2015   Irrigation and excisional debridement of right foot deep abscess;; Insertion of antibiotic beads into the right foot abscess   JOINT REPLACEMENT     MAXIMUM ACCESS (MAS)POSTERIOR LUMBAR INTERBODY FUSION (PLIF) 1 LEVEL N/A 01/01/2017   Procedure: L4-5 MAXIMUM ACCESS (MAS) POSTERIOR LUMBAR INTERBODY FUSION (PLIF);  Surgeon: Tia Alert, MD;  Location: [facility] OR;  Service: Neurosurgery;  Laterality: N/A;   TOTAL KNEE ARTHROPLASTY Left    WEIL OSTEOTOMY Left 06/06/2015   Procedure: LEFT TWO AND THREE METATARSAL WEIL OSTEOTOMY ;  Surgeon: Toni Arthurs, MD;  Location: [facility];  Service: Orthopedics;  Laterality: Left;   Patient Active Problem List   Diagnosis Date Noted   Metatarsal fracture 09/11/2021   S/P lumbar spinal fusion 01/01/2017   ADD (attention deficit disorder) 07/17/2015   Bipolar affective disorder (HCC) 07/17/2015   HLD (hyperlipidemia) 07/17/2015   BP (high blood pressure) 07/17/2015   Obstructive apnea 07/17/2015   Staphylococcus aureus infection    Foot osteomyelitis, right (HCC) 07/04/2015    PCP: Alysia Penna, MD  REFERRING PROVIDER: [name]  REFERRING DIAG: R hip pain, low back pain   THERAPY DIAG:  Pain in right hip  Pain in left hip  Other low back pain  Other abnormalities of gait and mobility  ONSET DATE:    SUBJECTIVE:                                                                                                                                                                                           SUBJECTIVE STATEMENT:  03/15/2023  Pt has been working on her HEP, still feels L hip is very stiff .   03/01/2023:   Eval:   Pt states worsening discomfort in low back and R hip. And some pain in L hip. She feels very "off". She  reports no pain in feet. She has been wearing Heel lift in R shoe since she was seen previously in PT. Now, without it, if she wears different shoes, she feels very uneven. Having a hard time finding shoes besides her sneakers that she can wear. She has been dong to [employer] with a trainer, and feels it has been helpful.  Pain: states Shooting pain into L thigh that makes leg buckle at times, intermittent. That is new. (Has had pain in L hip in the past)  Most pain is in R lateral and posterior hip,  and into bil low back/ bil glute region.   PERTINENT HISTORY:  L TKA,  Lumbar fusion L4/5 , previous foot and toe surgeries  PAIN:  Are you having pain? Yes NPRS scale: 6-7 /10 Pain location: R Hip and low back  Pain orientation: Right  PAIN TYPE: sore, painful Pain description: intermittent  Aggravating factors:  standing, walking, activity Relieving factors: rest    PRECAUTIONS: None  WEIGHT BEARING RESTRICTIONS No  FALLS:  Has patient fallen in last 6 months? No, Number of falls: 0  OCCUPATION: Retired,   PLOF: Independent  PATIENT GOALS  decreased pain in hip, back, be able to do more activity without pain.  Feel more "even" with other shoes .    OBJECTIVE:   DIAGNOSTIC FINDINGS: waiting on read for back and R hip.    COGNITION:  Overall cognitive status: Within functional limits for tasks assessed   POSTURE:  Standing: L knee in slight flexion, Increased weight bearing on R hip, slight fwd flexion of trunk.   Unable to achieve full upright trunk posture, some due to L hip stiffness.  Supine: R LE shorter,    PALPATION: R hip: tenderness in posterior and lateral hip, bil glutes, R SI,  Significant limitation and hypomobility for L hip  for ext, IR, flex, ER,  Mild limitation for L knee flex and ext (baseline from prev surgery)  Lumbar extension limited secondary to L hip extension limitation.    ROM AROM-    Lumbar: extension limited, secondary to L hip extension  limitation L knee: lacking full flexion and extension (from previous TKA) ; Limited DF bil;  L hip: significant limitation for ext>IR>flex>ER.    MMT: Knees: 4+5,  Hips: 4-/5 on L,   4/5 on R   LUMBAR SPECIAL TESTS:  Neg SLR bil;    GAIT: Decreased hip ext on L with decreased terminal stance position    TODAY'S TREATMENT   03/15/23 Therapeutic Exercise: Aerobic: Supine: SKTC 30 sec x 5;  Hip ER fallouts x 10, 5 sec;   Hip IR ROM x 10 bil;  Prone: prone on elbows stretch- for anterior hip stretch  Seated: Standing: Stretches:  LTR x 10;   Neuromuscular Re-education: Manual Therapy: Hip Inf and post mobs, inf mobs with strap,  Long leg distraction for L hip;  PROM for L hip, all motions.  Therapeutic Activity: Self Care:    03/10/2023 Therapeutic Exercise: Aerobic: Supine: SKTC 30 sec x 5;  Hip ER fallouts x 10, 5 sec;  Hip IR ROM x 10 bil;  Prone: prone on elbows stretch- for anterior hip stretch - reviewed for HEP Seated: Standing: Stretches:  Neuromuscular Re-education: Manual Therapy: Hip Inf mobs,  Long leg distraction for L hip;  PROM for L hip, all motions.  Therapeutic Activity: Self Care:   PATIENT EDUCATION:  Education details: updated and reviewed HEP  Person educated: Patient Education method: Explanation, Demonstration, Tactile cues, Verbal cues, and Handouts Education comprehension: verbalized understanding, returned demonstration, verbal cues required, tactile cues required, and needs further education   HOME EXERCISE PROGRAM: Access Code: [email or web address]) Access Code: [email or web address]  new    ASSESSMENT:  CLINICAL IMPRESSION: 03/15/2023 Continued focus on improving mobility for L hip, pt with good tolerance, but does have significant stiffness with all motions. Trial for bike for hip ROM, as pt has one at home that she could start using.   Eval:  Pt presents with primary complaint of pain in R hip and bil low back. She has inability for full  upright posture, seemingly due to significant stiffness noted in L hip. She has significant limitation for L hip ROM, and is standing with slight flexion in L hip and knee, with increased weight bearing/shift onto R side. She has increased pain in R hip, bil glutes, and increased tightness in R lumbar region.  She has mild leg length discrepancy, with R being shorter than L, which does seems to be effecting her quite a bit. She has had multiple foot surgeries for toes, but is not having pain in feet at this time. We will discuss optimal footwear, and possible need for shoe lift for other/dress shoes besides sneakers so she can wear this more /full time. She has quite a bit of asymmetry effecting posture and pain. Will focus on L hip mobility, improving lumbar posture, for decreasing back pain and hip pain, as well as improving gait mechanics. Pt with decreased ability for full functional activities and will benefit from skilled PT to improve.    OBJECTIVE IMPAIRMENTS Abnormal gait, decreased activity tolerance, decreased balance, decreased knowledge of use of DME, decreased mobility, difficulty walking, decreased ROM, decreased strength, increased muscle spasms, impaired flexibility, improper body mechanics, and pain.   ACTIVITY LIMITATIONS meal prep, cleaning, laundry, shopping, community activity,  and yard work.   PERSONAL FACTORS Past/current experiences /foot surgeries, L hip stiffness, leg length discrepancy, are effecting pts outcome.  REHAB POTENTIAL: Good  CLINICAL DECISION MAKING: Evolving/moderate complexity  EVALUATION COMPLEXITY: Moderate   GOALS: Goals reviewed with patient? Yes  SHORT TERM GOALS: Target date: 03/15/2023  Patient to be independent with initial HEP  Goal status: MET    LONG TERM GOALS: Target date: 04/25/2023  Patient to be independent with final HEP  Goal status: INITIAL  2.  Patient to demo improved ROM of L hip to be at max ability, to improve ability  for more upright posture and gait.   Goal status: INITIAL  3.  Patient to demo improved strength of  bil hips to 4+5,  to improve stability and pain  Goal status: INITIAL   4.  Pt to obtain and be complaint with footwear/heel lift as appropriate for her leg length discrepancy, to allow for improved gait pattern.   Goal status: INITIAL    PLAN: PT FREQUENCY: 1-2x/week  PT DURATION: 8 weeks  PLANNED INTERVENTIONS: Therapeutic exercises, Therapeutic activity, Neuromuscular re-education, Balance training, Gait training, Patient/Family education, Self Care, Joint mobilization, Joint manipulation, Stair training, Orthotic/Fit training, DME instructions, Dry Needling, Electrical stimulation, Spinal mobilization, Moist heat, Taping, Traction, Ultrasound, Ionotophoresis 4mg /ml Dexamethasone, Manual therapy, and Neuro Muscular re-education  PLAN FOR NEXT SESSION:  L hip mobility, manual for bil glute and low back pain, shoe lift recommendations,    Sedalia Muta, PT, DPT 2:12 PM  03/15/23

## 2023-03-15 NOTE — Progress Notes (Signed)
Right hip x-ray shows mild arthritis.

## 2023-03-16 DIAGNOSIS — R519 Headache, unspecified: Secondary | ICD-10-CM | POA: Diagnosis not present

## 2023-03-16 DIAGNOSIS — J342 Deviated nasal septum: Secondary | ICD-10-CM | POA: Diagnosis not present

## 2023-03-17 DIAGNOSIS — F3171 Bipolar disorder, in partial remission, most recent episode hypomanic: Secondary | ICD-10-CM | POA: Diagnosis not present

## 2023-03-18 ENCOUNTER — Ambulatory Visit: Payer: Medicare HMO | Admitting: Physical Therapy

## 2023-03-18 DIAGNOSIS — M25551 Pain in right hip: Secondary | ICD-10-CM | POA: Diagnosis not present

## 2023-03-18 DIAGNOSIS — R2689 Other abnormalities of gait and mobility: Secondary | ICD-10-CM

## 2023-03-18 DIAGNOSIS — M25552 Pain in left hip: Secondary | ICD-10-CM | POA: Diagnosis not present

## 2023-03-18 DIAGNOSIS — M5459 Other low back pain: Secondary | ICD-10-CM

## 2023-03-18 NOTE — Therapy (Signed)
OUTPATIENT PHYSICAL THERAPY TREATMENT   Patient Name: [place] L Lamarca MRN: [id number] DOB:01-25-1961, 62 y.o., female Today's Date: 03/18/2023   PT End of Session - 03/21/23 2027     Visit Number 4    Number of Visits 16    Date for PT Re-Evaluation 04/26/23    Authorization Type [employer]    PT Start Time 1350    PT Stop Time 1430    PT Time Calculation (min) 40 min    Activity Tolerance Patient tolerated treatment well    Behavior During Therapy WFL for tasks assessed/performed                Past Medical History:  Diagnosis Date   ADD (attention deficit disorder)    Anxiety    Arthritis    feet, knees (07/04/2015)   Bipolar disorder (HCC)    Complication of anesthesia    hard to wake up, blood pressure drops    Depression    Family history of adverse reaction to anesthesia    "sister hard to wake up also"   Fibromyalgia dx'd ~ 2000   GERD (gastroesophageal reflux disease)    Hyperlipidemia    Hypertension    Migraine    "hormonal; stopped when I was 16" (07/04/2015)   OSA (obstructive sleep apnea)    wears mouth piece, no CPAP   Seizures (HCC)    Past Surgical History:  Procedure Laterality Date   ABDOMINAL HYSTERECTOMY  ~ 1993   BACK SURGERY     CESAREAN SECTION  1987   COLONOSCOPY     COLONOSCOPY WITH PROPOFOL N/A 09/21/2017   Procedure: COLONOSCOPY WITH PROPOFOL;  Surgeon: Christena Deem, MD;  Location: [facility] ENDOSCOPY;  Service: Endoscopy;  Laterality: N/A;   HAMMER TOE SURGERY Right 03/2015   HAMMER TOE SURGERY Left 06/06/2015   Procedure: LEFT TWO-THREE  HAMMER TOE CORRECTION ;  Surgeon: Toni Arthurs, MD;  Location: [facility];  Service: Orthopedics;  Laterality: Left;   INCISION AND DRAINAGE OF WOUND Right 07/04/2015   Procedure: IRRIGATION AND DEBRIDEMENT OF RIGHT FOOT DEEP ABSCESS; EXCISION OF RIGHT SECOND METATARSAL HEAD; REMOVAL OF DEEP IMPLANT RIGHT FOOT; EXCISION OF RIGHT SECOND TOE PROXIMAL PHALANX BASE; INSERTION OF  ANTIBIOTIC BEADS INTO RIGHT FOOT ABSCESS;  Surgeon: Toni Arthurs, MD;  Location: [facility] OR;  Service: Orthopedics;  Laterality: Right;  SITE ALSO  RIGHT FOOT   IRRIGATION AND DEBRIDEMENT FOOT Right 07/04/2015   Irrigation and excisional debridement of right foot deep abscess;; Insertion of antibiotic beads into the right foot abscess   JOINT REPLACEMENT     MAXIMUM ACCESS (MAS)POSTERIOR LUMBAR INTERBODY FUSION (PLIF) 1 LEVEL N/A 01/01/2017   Procedure: L4-5 MAXIMUM ACCESS (MAS) POSTERIOR LUMBAR INTERBODY FUSION (PLIF);  Surgeon: Tia Alert, MD;  Location: [facility] OR;  Service: Neurosurgery;  Laterality: N/A;   TOTAL KNEE ARTHROPLASTY Left    WEIL OSTEOTOMY Left 06/06/2015   Procedure: LEFT TWO AND THREE METATARSAL WEIL OSTEOTOMY ;  Surgeon: Toni Arthurs, MD;  Location: [facility];  Service: Orthopedics;  Laterality: Left;   Patient Active Problem List   Diagnosis Date Noted   Metatarsal fracture 09/11/2021   S/P lumbar spinal fusion 01/01/2017   ADD (attention deficit disorder) 07/17/2015   Bipolar affective disorder (HCC) 07/17/2015   HLD (hyperlipidemia) 07/17/2015   BP (high blood pressure) 07/17/2015   Obstructive apnea 07/17/2015   Staphylococcus aureus infection    Foot osteomyelitis, right (HCC) 07/04/2015    PCP: Alysia Penna,  MD  REFERRING PROVIDER: [name]  REFERRING DIAG: R hip pain, low back pain   THERAPY DIAG:  Pain in right hip  Pain in left hip  Other low back pain  Other abnormalities of gait and mobility  ONSET DATE:    SUBJECTIVE:                                                                                                                                                                                           SUBJECTIVE STATEMENT:  03/18/2023  Pt has been working on her HEP, R hip and back has still been quite sore.   03/01/2023:   Eval:   Pt states worsening discomfort in low back and R hip. And some pain in L hip. She feels very  "off". She reports no pain in feet. She has been wearing Heel lift in R shoe since she was seen previously in PT. Now, without it, if she wears different shoes, she feels very uneven. Having a hard time finding shoes besides her sneakers that she can wear. She has been dong to [employer] with a trainer, and feels it has been helpful.  Pain: states Shooting pain into L thigh that makes leg buckle at times, intermittent. That is new. (Has had pain in L hip in the past)  Most pain is in R lateral and posterior hip,  and into bil low back/ bil glute region.   PERTINENT HISTORY:  L TKA,  Lumbar fusion L4/5 , previous foot and toe surgeries  PAIN:  Are you having pain? Yes NPRS scale: 6-7 /10 Pain location: R Hip and low back  Pain orientation: Right  PAIN TYPE: sore, painful Pain description: intermittent  Aggravating factors:  standing, walking, activity Relieving factors: rest    PRECAUTIONS: None  WEIGHT BEARING RESTRICTIONS No  FALLS:  Has patient fallen in last 6 months? No, Number of falls: 0  OCCUPATION: Retired,   PLOF: Independent  PATIENT GOALS  decreased pain in hip, back, be able to do more activity without pain.  Feel more "even" with other shoes .    OBJECTIVE:   DIAGNOSTIC FINDINGS: waiting on read for back and R hip.    COGNITION:  Overall cognitive status: Within functional limits for tasks assessed   POSTURE:  Standing: L knee in slight flexion, Increased weight bearing on R hip, slight fwd flexion of trunk.   Unable to achieve full upright trunk posture, some due to L hip stiffness.  Supine: R LE shorter,    PALPATION: R hip: tenderness in posterior and lateral hip, bil glutes, R SI,  Significant limitation and hypomobility  for L hip for ext, IR, flex, ER,  Mild limitation for L knee flex and ext (baseline from prev surgery)  Lumbar extension limited secondary to L hip extension limitation.    ROM AROM-    Lumbar: extension limited, secondary to L hip  extension limitation L knee: lacking full flexion and extension (from previous TKA) ; Limited DF bil;  L hip: significant limitation for ext>IR>flex>ER.    MMT: Knees: 4+5,  Hips: 4-/5 on L,   4/5 on R   LUMBAR SPECIAL TESTS:  Neg SLR bil;    GAIT: Decreased hip ext on L with decreased terminal stance position    TODAY'S TREATMENT   03/18/23 Therapeutic Exercise: Aerobic: Supine: SKTC 30 sec x 5;   Hip IR ROM x 10 bil; supine clams GTB x 20; bridging 2 x 10;  Prone: prone on elbows stretch- for anterior hip stretch  Seated: S/L:  Hip abd 2 x 10 bil;  Standing:  lumbar extension ( for back and hip extension x 10)  Stretches:  LTR x 10;   Neuromuscular Re-education: Manual Therapy: Hip Inf and post mobs, inf and lat mobs with strap,  Long leg distraction for L hip;  PROM for L hip, all motions.  Therapeutic Activity: Self Care:   03/10/2023 Therapeutic Exercise: Aerobic: Supine: SKTC 30 sec x 5;  Hip ER fallouts x 10, 5 sec;  Hip IR ROM x 10 bil;  Prone: prone on elbows stretch- for anterior hip stretch - reviewed for HEP Seated: Standing: Stretches:  Neuromuscular Re-education: Manual Therapy: Hip Inf mobs,  Long leg distraction for L hip;  PROM for L hip, all motions.  Therapeutic Activity: Self Care:   PATIENT EDUCATION:  Education details: updated and reviewed HEP  Person educated: Patient Education method: Explanation, Demonstration, Tactile cues, Verbal cues, and Handouts Education comprehension: verbalized understanding, returned demonstration, verbal cues required, tactile cues required, and needs further education   HOME EXERCISE PROGRAM: Access Code: [email or web address](previous) Access Code: [email or web address]  new    ASSESSMENT:  CLINICAL IMPRESSION: 03/18/2023 Continued focus on improving mobility for L hip, pt with good tolerance, but does have significant stiffness with all motions. Pt with good tolerance for manual and ther ex. Reviewed optimal upright  standing posture, pt limited with ability due to hip stiffness.   Eval:  Pt presents with primary complaint of pain in R hip and bil low back. She has inability for full upright posture, seemingly due to significant stiffness noted in L hip. She has significant limitation for L hip ROM, and is standing with slight flexion in L hip and knee, with increased weight bearing/shift onto R side. She has increased pain in R hip, bil glutes, and increased tightness in R lumbar region.  She has mild leg length discrepancy, with R being shorter than L, which does seems to be effecting her quite a bit. She has had multiple foot surgeries for toes, but is not having pain in feet at this time. We will discuss optimal footwear, and possible need for shoe lift for other/dress shoes besides sneakers so she can wear this more /full time. She has quite a bit of asymmetry effecting posture and pain. Will focus on L hip mobility, improving lumbar posture, for decreasing back pain and hip pain, as well as improving gait mechanics. Pt with decreased ability for full functional activities and will benefit from skilled PT to improve.    OBJECTIVE IMPAIRMENTS Abnormal gait, decreased activity tolerance, decreased balance, decreased knowledge of use  of DME, decreased mobility, difficulty walking, decreased ROM, decreased strength, increased muscle spasms, impaired flexibility, improper body mechanics, and pain.   ACTIVITY LIMITATIONS meal prep, cleaning, laundry, shopping, community activity, and yard work.   PERSONAL FACTORS Past/current experiences /foot surgeries, L hip stiffness, leg length discrepancy, are effecting pts outcome.  REHAB POTENTIAL: Good  CLINICAL DECISION MAKING: Evolving/moderate complexity  EVALUATION COMPLEXITY: Moderate   GOALS: Goals reviewed with patient? Yes  SHORT TERM GOALS: Target date: 03/15/2023  Patient to be independent with initial HEP  Goal status: MET    LONG TERM GOALS:  Target date: 04/25/2023  Patient to be independent with final HEP  Goal status: INITIAL  2.  Patient to demo improved ROM of L hip to be at max ability, to improve ability for more upright posture and gait.   Goal status: INITIAL  3.  Patient to demo improved strength of  bil hips to 4+5,  to improve stability and pain  Goal status: INITIAL   4.  Pt to obtain and be complaint with footwear/heel lift as appropriate for her leg length discrepancy, to allow for improved gait pattern.   Goal status: INITIAL    PLAN: PT FREQUENCY: 1-2x/week  PT DURATION: 8 weeks  PLANNED INTERVENTIONS: Therapeutic exercises, Therapeutic activity, Neuromuscular re-education, Balance training, Gait training, Patient/Family education, Self Care, Joint mobilization, Joint manipulation, Stair training, Orthotic/Fit training, DME instructions, Dry Needling, Electrical stimulation, Spinal mobilization, Moist heat, Taping, Traction, Ultrasound, Ionotophoresis 4mg /ml Dexamethasone, Manual therapy, and Neuro Muscular re-education  PLAN FOR NEXT SESSION:  L hip mobility, manual for bil glute and low back pain, shoe lift recommendations,    Sedalia Muta, PT, DPT 8:30 PM  03/21/23

## 2023-03-21 ENCOUNTER — Encounter: Payer: Self-pay | Admitting: Physical Therapy

## 2023-03-21 DIAGNOSIS — H348122 Central retinal vein occlusion, left eye, stable: Secondary | ICD-10-CM | POA: Diagnosis not present

## 2023-03-22 ENCOUNTER — Ambulatory Visit: Payer: Medicare HMO | Admitting: Physical Therapy

## 2023-03-22 ENCOUNTER — Encounter: Payer: Self-pay | Admitting: Physical Therapy

## 2023-03-22 DIAGNOSIS — M25552 Pain in left hip: Secondary | ICD-10-CM | POA: Diagnosis not present

## 2023-03-22 DIAGNOSIS — Z6823 Body mass index (BMI) 23.0-23.9, adult: Secondary | ICD-10-CM | POA: Diagnosis not present

## 2023-03-22 DIAGNOSIS — M25551 Pain in right hip: Secondary | ICD-10-CM

## 2023-03-22 DIAGNOSIS — Z800 Family history of malignant neoplasm of digestive organs: Secondary | ICD-10-CM | POA: Diagnosis not present

## 2023-03-22 DIAGNOSIS — M5459 Other low back pain: Secondary | ICD-10-CM | POA: Diagnosis not present

## 2023-03-22 DIAGNOSIS — R2689 Other abnormalities of gait and mobility: Secondary | ICD-10-CM

## 2023-03-22 DIAGNOSIS — N952 Postmenopausal atrophic vaginitis: Secondary | ICD-10-CM | POA: Diagnosis not present

## 2023-03-22 DIAGNOSIS — Z01419 Encounter for gynecological examination (general) (routine) without abnormal findings: Secondary | ICD-10-CM | POA: Diagnosis not present

## 2023-03-22 DIAGNOSIS — M8580 Other specified disorders of bone density and structure, unspecified site: Secondary | ICD-10-CM | POA: Diagnosis not present

## 2023-03-22 NOTE — Therapy (Signed)
OUTPATIENT PHYSICAL THERAPY TREATMENT   Patient Name: Whitney Lewis MRN: [id number] DOB:21-Sep-1960, 62 y.o., female Today's Date: 03/22/2023   PT End of Session - 03/22/23 1353     Visit Number 5    Number of Visits 16    Date for PT Re-Evaluation 04/26/23    Authorization Type [employer]    PT Start Time 1216    PT Stop Time 1300    PT Time Calculation (min) 44 min    Activity Tolerance Patient tolerated treatment well    Behavior During Therapy WFL for tasks assessed/performed                 Past Medical History:  Diagnosis Date   ADD (attention deficit disorder)    Anxiety    Arthritis    feet, knees (07/04/2015)   Bipolar disorder (HCC)    Complication of anesthesia    hard to wake up, blood pressure drops    Depression    Family history of adverse reaction to anesthesia    "sister hard to wake up also"   Fibromyalgia dx'd ~ 2000   GERD (gastroesophageal reflux disease)    Hyperlipidemia    Hypertension    Migraine    "hormonal; stopped when I was 16" (07/04/2015)   OSA (obstructive sleep apnea)    wears mouth piece, no CPAP   Seizures (HCC)    Past Surgical History:  Procedure Laterality Date   ABDOMINAL HYSTERECTOMY  ~ 1993   BACK SURGERY     CESAREAN SECTION  1987   COLONOSCOPY     COLONOSCOPY WITH PROPOFOL N/A 09/21/2017   Procedure: COLONOSCOPY WITH PROPOFOL;  Surgeon: Christena Deem, MD;  Location: [facility] ENDOSCOPY;  Service: Endoscopy;  Laterality: N/A;   HAMMER TOE SURGERY Right 03/2015   HAMMER TOE SURGERY Left 06/06/2015   Procedure: LEFT TWO-THREE  HAMMER TOE CORRECTION ;  Surgeon: Toni Arthurs, MD;  Location: [facility];  Service: Orthopedics;  Laterality: Left;   INCISION AND DRAINAGE OF WOUND Right 07/04/2015   Procedure: IRRIGATION AND DEBRIDEMENT OF RIGHT FOOT DEEP ABSCESS; EXCISION OF RIGHT SECOND METATARSAL HEAD; REMOVAL OF DEEP IMPLANT RIGHT FOOT; EXCISION OF RIGHT SECOND TOE PROXIMAL PHALANX BASE; INSERTION OF  ANTIBIOTIC BEADS INTO RIGHT FOOT ABSCESS;  Surgeon: Toni Arthurs, MD;  Location: [facility] OR;  Service: Orthopedics;  Laterality: Right;  SITE ALSO  RIGHT FOOT   IRRIGATION AND DEBRIDEMENT FOOT Right 07/04/2015   Irrigation and excisional debridement of right foot deep abscess;; Insertion of antibiotic beads into the right foot abscess   JOINT REPLACEMENT     MAXIMUM ACCESS (MAS)POSTERIOR LUMBAR INTERBODY FUSION (PLIF) 1 LEVEL N/A 01/01/2017   Procedure: L4-5 MAXIMUM ACCESS (MAS) POSTERIOR LUMBAR INTERBODY FUSION (PLIF);  Surgeon: Tia Alert, MD;  Location: [facility] OR;  Service: Neurosurgery;  Laterality: N/A;   TOTAL KNEE ARTHROPLASTY Left    WEIL OSTEOTOMY Left 06/06/2015   Procedure: LEFT TWO AND THREE METATARSAL WEIL OSTEOTOMY ;  Surgeon: Toni Arthurs, MD;  Location: [facility];  Service: Orthopedics;  Laterality: Left;   Patient Active Problem List   Diagnosis Date Noted   Metatarsal fracture 09/11/2021   S/P lumbar spinal fusion 01/01/2017   ADD (attention deficit disorder) 07/17/2015   Bipolar affective disorder (HCC) 07/17/2015   HLD (hyperlipidemia) 07/17/2015   BP (high blood pressure) 07/17/2015   Obstructive apnea 07/17/2015   Staphylococcus aureus infection    Foot osteomyelitis, right (HCC) 07/04/2015    PCP: Link Snuffer,  Lorin Picket, MD  REFERRING PROVIDER: [name]  REFERRING DIAG: R hip pain, low back pain   THERAPY DIAG:  Pain in right hip  Pain in left hip  Other low back pain  Other abnormalities of gait and mobility  ONSET DATE:    SUBJECTIVE:                                                                                                                                                                                           SUBJECTIVE STATEMENT:  03/22/2023  Pt states increased soreness in hips in the last couple days.    03/01/2023:   Eval:   Pt states worsening discomfort in low back and R hip. And some pain in L hip. She feels very "off". She  reports no pain in feet. She has been wearing Heel lift in R shoe since she was seen previously in PT. Now, without it, if she wears different shoes, she feels very uneven. Having a hard time finding shoes besides her sneakers that she can wear. She has been dong to [employer] with a trainer, and feels it has been helpful.  Pain: states Shooting pain into L thigh that makes leg buckle at times, intermittent. That is new. (Has had pain in L hip in the past)  Most pain is in R lateral and posterior hip,  and into bil low back/ bil glute region.   PERTINENT HISTORY:  L TKA,  Lumbar fusion L4/5 , previous foot and toe surgeries  PAIN:  Are you having pain? Yes NPRS scale: 6-7 /10 Pain location: R Hip and low back  Pain orientation: Right  PAIN TYPE: sore, painful Pain description: intermittent  Aggravating factors:  standing, walking, activity Relieving factors: rest    PRECAUTIONS: None  WEIGHT BEARING RESTRICTIONS No  FALLS:  Has patient fallen in last 6 months? No, Number of falls: 0  OCCUPATION: Retired,   PLOF: Independent  PATIENT GOALS  decreased pain in hip, back, be able to do more activity without pain.  Feel more "even" with other shoes .    OBJECTIVE:   DIAGNOSTIC FINDINGS: waiting on read for back and R hip.    COGNITION:  Overall cognitive status: Within functional limits for tasks assessed   POSTURE:  Standing: L knee in slight flexion, Increased weight bearing on R hip, slight fwd flexion of trunk.   Unable to achieve full upright trunk posture, some due to L hip stiffness.  Supine: R LE shorter,    PALPATION: R hip: tenderness in posterior and lateral hip, bil glutes, R SI,  Significant limitation and hypomobility for L hip  for ext, IR, flex, ER,  Mild limitation for L knee flex and ext (baseline from prev surgery)  Lumbar extension limited secondary to L hip extension limitation.    ROM AROM-    Lumbar: extension limited, secondary to L hip extension  limitation L knee: lacking full flexion and extension (from previous TKA) ; Limited DF bil;  L hip: significant limitation for ext>IR>flex>ER.    MMT: Knees: 4+5,  Hips: 4-/5 on L,   4/5 on R   LUMBAR SPECIAL TESTS:  Neg SLR bil;    GAIT: Decreased hip ext on L with decreased terminal stance position    TODAY'S TREATMENT   03/22/23: Therapeutic Exercise: Aerobic: bike L1  x 5 min Supine:  Hip ER fallouts x 10, 5 sec; supine clams Gtb x 20;  bridging x 15; SKTC 20 sec x 3 bil; SLR x 10 bil (pain on L)  Seated: Standing: Stretches:  Neuromuscular Re-education: Manual Therapy:   Long leg distraction for L hip and R lumbar;   PROM for L hip, all motions.  Therapeutic Activity: Self Care:    03/18/23 Therapeutic Exercise: Aerobic: Supine: SKTC 30 sec x 5;   Hip IR ROM x 10 bil; supine clams GTB x 20; bridging 2 x 10;  Prone: prone on elbows stretch- for anterior hip stretch  Seated: S/L:  Hip abd 2 x 10 bil;  Standing:  lumbar extension ( for back and hip extension x 10)  Stretches:  LTR x 10;   Neuromuscular Re-education: Manual Therapy: Hip Inf and post mobs, inf and lat mobs with strap,  Long leg distraction for L hip;  PROM for L hip, all motions.  Therapeutic Activity: Self Care:     PATIENT EDUCATION:  Education details: updated and reviewed HEP  Person educated: Patient Education method: Explanation, Demonstration, Tactile cues, Verbal cues, and Handouts Education comprehension: verbalized understanding, returned demonstration, verbal cues required, tactile cues required, and needs further education   HOME EXERCISE PROGRAM: Access Code: [email or web address](previous) Access Code: [email or web address]  new    ASSESSMENT:  CLINICAL IMPRESSION: 03/22/2023 Light activities done today for Hip and back mobility as well as light strengthening. She does have soreness deep in L glute with deep palpation today, as well as increased pain in L hip with SLR today. Recommended pt  continue light ther ex as able as well as heating pad. Plan to progress as tolerated.   Eval:  Pt presents with primary complaint of pain in R hip and bil low back. She has inability for full upright posture, seemingly due to significant stiffness noted in L hip. She has significant limitation for L hip ROM, and is standing with slight flexion in L hip and knee, with increased weight bearing/shift onto R side. She has increased pain in R hip, bil glutes, and increased tightness in R lumbar region.  She has mild leg length discrepancy, with R being shorter than L, which does seems to be effecting her quite a bit. She has had multiple foot surgeries for toes, but is not having pain in feet at this time. We will discuss optimal footwear, and possible need for shoe lift for other/dress shoes besides sneakers so she can wear this more /full time. She has quite a bit of asymmetry effecting posture and pain. Will focus on L hip mobility, improving lumbar posture, for decreasing back pain and hip pain, as well as improving gait mechanics. Pt with decreased ability for full functional activities and will benefit from skilled PT  to improve.    OBJECTIVE IMPAIRMENTS Abnormal gait, decreased activity tolerance, decreased balance, decreased knowledge of use of DME, decreased mobility, difficulty walking, decreased ROM, decreased strength, increased muscle spasms, impaired flexibility, improper body mechanics, and pain.   ACTIVITY LIMITATIONS meal prep, cleaning, laundry, shopping, community activity, and yard work.   PERSONAL FACTORS Past/current experiences /foot surgeries, L hip stiffness, leg length discrepancy, are effecting pts outcome.  REHAB POTENTIAL: Good  CLINICAL DECISION MAKING: Evolving/moderate complexity  EVALUATION COMPLEXITY: Moderate   GOALS: Goals reviewed with patient? Yes  SHORT TERM GOALS: Target date: 03/15/2023  Patient to be independent with initial HEP  Goal status:  MET    LONG TERM GOALS: Target date: 04/25/2023  Patient to be independent with final HEP  Goal status: INITIAL  2.  Patient to demo improved ROM of L hip to be at max ability, to improve ability for more upright posture and gait.   Goal status: INITIAL  3.  Patient to demo improved strength of  bil hips to 4+5,  to improve stability and pain  Goal status: INITIAL   4.  Pt to obtain and be complaint with footwear/heel lift as appropriate for her leg length discrepancy, to allow for improved gait pattern.   Goal status: INITIAL   PLAN: PT FREQUENCY: 1-2x/week  PT DURATION: 8 weeks  PLANNED INTERVENTIONS: Therapeutic exercises, Therapeutic activity, Neuromuscular re-education, Balance training, Gait training, Patient/Family education, Self Care, Joint mobilization, Joint manipulation, Stair training, Orthotic/Fit training, DME instructions, Dry Needling, Electrical stimulation, Spinal mobilization, Moist heat, Taping, Traction, Ultrasound, Ionotophoresis 4mg /ml Dexamethasone, Manual therapy, and Neuro Muscular re-education  PLAN FOR NEXT SESSION:  L hip mobility, manual for bil glute and low back pain, shoe lift recommendations,    Sedalia Muta, PT, DPT 8:54 PM  03/22/23

## 2023-03-24 ENCOUNTER — Encounter: Payer: Medicare HMO | Admitting: Physical Therapy

## 2023-03-25 ENCOUNTER — Ambulatory Visit: Payer: Medicare HMO | Admitting: Physical Therapy

## 2023-03-25 ENCOUNTER — Ambulatory Visit: Payer: Medicare HMO | Admitting: Family Medicine

## 2023-03-25 ENCOUNTER — Other Ambulatory Visit: Payer: Self-pay

## 2023-03-25 ENCOUNTER — Encounter: Payer: Self-pay | Admitting: Physical Therapy

## 2023-03-25 ENCOUNTER — Encounter: Payer: Self-pay | Admitting: Family Medicine

## 2023-03-25 VITALS — BP 118/78 | HR 73 | Ht 66.0 in | Wt 147.0 lb

## 2023-03-25 DIAGNOSIS — M5459 Other low back pain: Secondary | ICD-10-CM

## 2023-03-25 DIAGNOSIS — M25551 Pain in right hip: Secondary | ICD-10-CM | POA: Diagnosis not present

## 2023-03-25 DIAGNOSIS — M25552 Pain in left hip: Secondary | ICD-10-CM

## 2023-03-25 NOTE — Patient Instructions (Addendum)
Thank you for coming in today.   You should hear from MRI scheduling within 1 week. If you do not hear please let me know.    Let me know if you have any problems.   OK to continue PT.

## 2023-03-25 NOTE — Progress Notes (Signed)
   I, Stevenson Clinch, CMA acting as a Neurosurgeon for [name], MD.  Whitney Lewis is a 62 y.o. female who presents to [facility] at [facility] today for L hip pain. Pt was previously seen by Dr. Denyse Amass on 02/23/23 for R hip and low back pain and has been doing PT at [facility].  Today, pt c/o L hip pain x 3 years, worsening over the past 2-3 weeks. Pt locates pain to posterior aspect of the hip. Notes stiffness and limited ROM in the left hip. Has been working with PT on ROM. Able to walk 30-45 min daily. Sx are causing worsening lower back and right hip sx.  She had significant difficulty trying to put a pair of pantyhose on the other day.  Radiates: No Aggravates: Motion Treatments tried: heat, PT,   Pertinent review of systems: No fevers or chills  Relevant historical information: ADD and bipolar disorder.   Exam:  BP 118/78   Pulse 73   Ht 5\' 6"  (1.676 m)   Wt 147 lb (66.7 kg)   SpO2 98%   BMI 23.73 kg/m  General: Well Developed, well nourished, and in no acute distress.   MSK: Left hip normal-appearing Very limited motion.  Lacks extension beyond 0 degrees.  Significant limitation in flexion and internal rotation and adduction.    Lab and Radiology Results  X-ray images left hip obtained on x-ray right hip February 23, 2023 shows minimal arthritis.  No acute fractures.    Assessment and Plan: 62 y.o. female with left hip lack of range of motion assist with pain and a leg length discrepancy.  She has had some physical therapy and has significant lack of range of motion and worsening pain.  She does not have enough arthritis to explain her symptoms.  Plan for MRI arthrogram to further evaluate source of pain and lack of motion.    PDMP not reviewed this encounter. Orders Placed This Encounter  Procedures   MR HIP LEFT W CONTRAST    Standing Status:   Future    Standing Expiration Date:   03/24/2024    Scheduling Instructions:     Schedule with Dr T 1  hr prior to MRI for hip injection. No IV contrast    Order Specific Question:   Reason for Exam (SYMPTOM  OR DIAGNOSIS REQUIRED)    Answer:   eval labrum tear and lack of range of motion    Order Specific Question:   If indicated for the ordered procedure, I authorize the administration of contrast media per Radiology protocol    Answer:   Yes    Order Specific Question:   What is the patient's sedation requirement?    Answer:   No Sedation    Order Specific Question:   Does the patient have a pacemaker or implanted devices?    Answer:   No    Order Specific Question:   Preferred imaging location?    Answer:   [facility] (table limit-350lbs)   No orders of the defined types were placed in this encounter.    Discussed warning signs or symptoms. Please see discharge instructions. Patient expresses understanding.   The above documentation has been reviewed and is accurate and complete [name], M.D.

## 2023-03-25 NOTE — Therapy (Signed)
OUTPATIENT PHYSICAL THERAPY TREATMENT   Patient Name: Whitney Lewis MRN: [id number] DOB:[birth year], 62 y.o., female Today's Date: 03/25/2023   PT End of Session - 03/25/23 0819     Visit Number 6    Number of Visits 16    Date for PT Re-Evaluation 04/26/23    Authorization Type [employer]    PT Start Time 0808    PT Stop Time 0846    PT Time Calculation (min) 38 min    Activity Tolerance Patient tolerated treatment well    Behavior During Therapy [facility] for tasks assessed/performed                  Past Medical History:  Diagnosis Date   ADD (attention deficit disorder)    Anxiety    Arthritis    feet, knees (07/04/2015)   Bipolar disorder (HCC)    Complication of anesthesia    hard to wake up, blood pressure drops    Depression    Family history of adverse reaction to anesthesia    "sister hard to wake up also"   Fibromyalgia dx'd ~ 2000   GERD (gastroesophageal reflux disease)    Hyperlipidemia    Hypertension    Migraine    "hormonal; stopped when I was 16" (07/04/2015)   OSA (obstructive sleep apnea)    wears mouth piece, no CPAP   Seizures (HCC)    Past Surgical History:  Procedure Laterality Date   ABDOMINAL HYSTERECTOMY  ~ 1993   BACK SURGERY     CESAREAN SECTION  1987   COLONOSCOPY     COLONOSCOPY WITH PROPOFOL N/A 09/21/2017   Procedure: COLONOSCOPY WITH PROPOFOL;  Surgeon: Christena Deem, MD;  Location: [facility] ENDOSCOPY;  Service: Endoscopy;  Laterality: N/A;   HAMMER TOE SURGERY Right 03/2015   HAMMER TOE SURGERY Left 06/06/2015   Procedure: LEFT TWO-THREE  HAMMER TOE CORRECTION ;  Surgeon: Toni Arthurs, MD;  Location: [facility];  Service: Orthopedics;  Laterality: Left;   INCISION AND DRAINAGE OF WOUND Right 07/04/2015   Procedure: IRRIGATION AND DEBRIDEMENT OF RIGHT FOOT DEEP ABSCESS; EXCISION OF RIGHT SECOND METATARSAL HEAD; REMOVAL OF DEEP IMPLANT RIGHT FOOT; EXCISION OF RIGHT SECOND TOE PROXIMAL PHALANX BASE; INSERTION OF  ANTIBIOTIC BEADS INTO RIGHT FOOT ABSCESS;  Surgeon: Toni Arthurs, MD;  Location: [facility] OR;  Service: Orthopedics;  Laterality: Right;  SITE ALSO  RIGHT FOOT   IRRIGATION AND DEBRIDEMENT FOOT Right 07/04/2015   Irrigation and excisional debridement of right foot deep abscess;; Insertion of antibiotic beads into the right foot abscess   JOINT REPLACEMENT     MAXIMUM ACCESS (MAS)POSTERIOR LUMBAR INTERBODY FUSION (PLIF) 1 LEVEL N/A 01/01/2017   Procedure: L4-5 MAXIMUM ACCESS (MAS) POSTERIOR LUMBAR INTERBODY FUSION (PLIF);  Surgeon: Tia Alert, MD;  Location: [facility] OR;  Service: Neurosurgery;  Laterality: N/A;   TOTAL KNEE ARTHROPLASTY Left    WEIL OSTEOTOMY Left 06/06/2015   Procedure: LEFT TWO AND THREE METATARSAL WEIL OSTEOTOMY ;  Surgeon: Toni Arthurs, MD;  Location: [facility];  Service: Orthopedics;  Laterality: Left;   Patient Active Problem List   Diagnosis Date Noted   Metatarsal fracture 09/11/2021   S/P lumbar spinal fusion 01/01/2017   ADD (attention deficit disorder) 07/17/2015   Bipolar affective disorder (HCC) 07/17/2015   HLD (hyperlipidemia) 07/17/2015   BP (high blood pressure) 07/17/2015   Obstructive apnea 07/17/2015   Staphylococcus aureus infection    Foot osteomyelitis, right (HCC) 07/04/2015    PCP:  Alysia Penna, MD  REFERRING PROVIDER: [name]  REFERRING DIAG: R hip pain, low back pain   THERAPY DIAG:  Pain in right hip  Pain in left hip  Other low back pain  ONSET DATE:    SUBJECTIVE:                                                                                                                                                                                           SUBJECTIVE STATEMENT: 03/25/2023  L hip not as sore today. R hip hasn't been too bad overall, but still feeling soreness in R sacrum area.  Thinks she could stand up straighter this AM.    03/01/2023:   Eval:   Pt states worsening discomfort in low back and R hip. And some  pain in L hip. She feels very "off". She reports no pain in feet. She has been wearing Heel lift in R shoe since she was seen previously in PT. Now, without it, if she wears different shoes, she feels very uneven. Having a hard time finding shoes besides her sneakers that she can wear. She has been dong to [employer] with a trainer, and feels it has been helpful.  Pain: states Shooting pain into L thigh that makes leg buckle at times, intermittent. That is new. (Has had pain in L hip in the past)  Most pain is in R lateral and posterior hip,  and into bil low back/ bil glute region.   PERTINENT HISTORY:  L TKA,  Lumbar fusion L4/5 , previous foot and toe surgeries  PAIN:  Are you having pain? Yes NPRS scale: 6-7 /10 Pain location: R Hip and low back  Pain orientation: Right  PAIN TYPE: sore, painful Pain description: intermittent  Aggravating factors:  standing, walking, activity Relieving factors: rest    PRECAUTIONS: None  WEIGHT BEARING RESTRICTIONS No  FALLS:  Has patient fallen in last 6 months? No, Number of falls: 0  OCCUPATION: Retired,   PLOF: Independent  PATIENT GOALS  decreased pain in hip, back, be able to do more activity without pain.  Feel more "even" with other shoes .    OBJECTIVE:   DIAGNOSTIC FINDINGS: waiting on read for back and R hip.    COGNITION:  Overall cognitive status: Within functional limits for tasks assessed   POSTURE:  Standing: L knee in slight flexion, Increased weight bearing on R hip, slight fwd flexion of trunk.   Unable to achieve full upright trunk posture, some due to L hip stiffness.  Supine: R LE shorter,    PALPATION: R hip: tenderness in posterior and lateral hip,  bil glutes, R SI,  Significant limitation and hypomobility for L hip for ext, IR, flex, ER,  Mild limitation for L knee flex and ext (baseline from prev surgery)  Lumbar extension limited secondary to L hip extension limitation.    ROM AROM-    Lumbar:  extension limited, secondary to L hip extension limitation L knee: lacking full flexion and extension (from previous TKA) ; Limited DF bil;  L hip: significant limitation for ext>IR>flex>ER.    MMT: Knees: 4+5,  Hips: 4-/5 on L,   4/5 on R   LUMBAR SPECIAL TESTS:  Neg SLR bil;    GAIT: Decreased hip ext on L with decreased terminal stance position    TODAY'S TREATMENT   03/25/23: Therapeutic Exercise: Aerobic: bike L1  x 6 min Supine:  Hip ER fallouts x 10, 5 sec; supine clams Gtb x 20;  bridging x 15;  SKTC 20 sec x 3 bil; SLR x 20 bil (no pain on L today)  Seated: Standing: bwd walking 10 ft x 6 in hallway; step ups fwd, lateral 12 in x 10 ea bil, no UE support; squats 2 x 10  Stretches:  Neuromuscular Re-education: Manual Therapy:     Therapeutic Activity: Self Care:    03/22/23: Therapeutic Exercise: Aerobic: bike L1  x 5 min Supine:  Hip ER fallouts x 10, 5 sec; supine clams Gtb x 20;  bridging x 15; SKTC 20 sec x 3 bil; SLR x 10 bil (pain on L)  Seated: Standing:   Stretches:  Neuromuscular Re-education: Manual Therapy:   Long leg distraction for L hip and R lumbar;   PROM for L hip, all motions.  Therapeutic Activity: Self Care:    03/18/23 Therapeutic Exercise: Aerobic: Supine: SKTC 30 sec x 5;   Hip IR ROM x 10 bil; supine clams GTB x 20; bridging 2 x 10;  Prone: prone on elbows stretch- for anterior hip stretch  Seated: S/L:  Hip abd 2 x 10 bil;  Standing:  lumbar extension ( for back and hip extension x 10)  Stretches:  LTR x 10;   Neuromuscular Re-education: Manual Therapy: Hip Inf and post mobs, inf and lat mobs with strap,  Long leg distraction for L hip;  PROM for L hip, all motions.  Therapeutic Activity: Self Care:     PATIENT EDUCATION:  Education details: updated and reviewed HEP  Person educated: Patient Education method: Explanation, Demonstration, Tactile cues, Verbal cues, and Handouts Education comprehension: verbalized  understanding, returned demonstration, verbal cues required, tactile cues required, and needs further education   HOME EXERCISE PROGRAM: Access Code: [email or web address](previous) Access Code: [email or web address]  new    ASSESSMENT:  CLINICAL IMPRESSION: 03/25/2023  Today's session focused on global hip and knee strengthening to promote muscular activation throughout the ROM. Pt able to progress glute interventions that promote glute ext activation for walking, stair navigation, squatting, and trunk upright posture. Pt continues to have difficulty performing hip ext secondary to L hip tightness that follows a capsular pattern that limits muscular activation. Pt able to perform all interventions without modification or increase in pain.  Eval:  Pt presents with primary complaint of pain in R hip and bil low back. She has inability for full upright posture, seemingly due to significant stiffness noted in L hip. She has significant limitation for L hip ROM, and is standing with slight flexion in L hip and knee, with increased weight bearing/shift onto R side. She has increased pain in R hip, bil glutes,  and increased tightness in R lumbar region.  She has mild leg length discrepancy, with R being shorter than L, which does seems to be effecting her quite a bit. She has had multiple foot surgeries for toes, but is not having pain in feet at this time. We will discuss optimal footwear, and possible need for shoe lift for other/dress shoes besides sneakers so she can wear this more /full time. She has quite a bit of asymmetry effecting posture and pain. Will focus on L hip mobility, improving lumbar posture, for decreasing back pain and hip pain, as well as improving gait mechanics. Pt with decreased ability for full functional activities and will benefit from skilled PT to improve.    OBJECTIVE IMPAIRMENTS Abnormal gait, decreased activity tolerance, decreased balance, decreased knowledge of use of DME, decreased mobility,  difficulty walking, decreased ROM, decreased strength, increased muscle spasms, impaired flexibility, improper body mechanics, and pain.   ACTIVITY LIMITATIONS meal prep, cleaning, laundry, shopping, community activity, and yard work.   PERSONAL FACTORS Past/current experiences /foot surgeries, L hip stiffness, leg length discrepancy, are effecting pts outcome.  REHAB POTENTIAL: Good  CLINICAL DECISION MAKING: Evolving/moderate complexity  EVALUATION COMPLEXITY: Moderate   GOALS: Goals reviewed with patient? Yes  SHORT TERM GOALS: Target date: 03/15/2023  Patient to be independent with initial HEP  Goal status: MET    LONG TERM GOALS: Target date: 04/25/2023  Patient to be independent with final HEP  Goal status: INITIAL  2.  Patient to demo improved ROM of L hip to be at max ability, to improve ability for more upright posture and gait.   Goal status: INITIAL  3.  Patient to demo improved strength of  bil hips to 4+5,  to improve stability and pain  Goal status: INITIAL   4.  Pt to obtain and be complaint with footwear/heel lift as appropriate for her leg length discrepancy, to allow for improved gait pattern.   Goal status: INITIAL   PLAN: PT FREQUENCY: 1-2x/week  PT DURATION: 8 weeks  PLANNED INTERVENTIONS: Therapeutic exercises, Therapeutic activity, Neuromuscular re-education, Balance training, Gait training, Patient/Family education, Self Care, Joint mobilization, Joint manipulation, Stair training, Orthotic/Fit training, DME instructions, Dry Needling, Electrical stimulation, Spinal mobilization, Moist heat, Taping, Traction, Ultrasound, Ionotophoresis 4mg /ml Dexamethasone, Manual therapy, and Neuro Muscular re-education  PLAN FOR NEXT SESSION:  L hip mobility, manual for bil glute and low back pain, shoe lift recommendations,   Nechama Guard, SPT  This entire session was performed under direct supervision and direction of a licensed Veterinary surgeon . I have personally read, edited and approve of the note as written.   Sedalia Muta, PT, DPT 11:25 AM  03/25/23

## 2023-03-26 DIAGNOSIS — Z1231 Encounter for screening mammogram for malignant neoplasm of breast: Secondary | ICD-10-CM | POA: Diagnosis not present

## 2023-04-06 ENCOUNTER — Ambulatory Visit: Payer: Medicare HMO | Admitting: Sports Medicine

## 2023-04-06 ENCOUNTER — Other Ambulatory Visit (HOSPITAL_COMMUNITY): Payer: Self-pay | Admitting: Internal Medicine

## 2023-04-06 ENCOUNTER — Ambulatory Visit: Payer: PPO | Admitting: Family Medicine

## 2023-04-06 DIAGNOSIS — H348122 Central retinal vein occlusion, left eye, stable: Secondary | ICD-10-CM

## 2023-04-07 DIAGNOSIS — G4700 Insomnia, unspecified: Secondary | ICD-10-CM | POA: Diagnosis not present

## 2023-04-07 DIAGNOSIS — F3181 Bipolar II disorder: Secondary | ICD-10-CM | POA: Diagnosis not present

## 2023-04-07 DIAGNOSIS — F3171 Bipolar disorder, in partial remission, most recent episode hypomanic: Secondary | ICD-10-CM | POA: Diagnosis not present

## 2023-04-07 DIAGNOSIS — F900 Attention-deficit hyperactivity disorder, predominantly inattentive type: Secondary | ICD-10-CM | POA: Diagnosis not present

## 2023-04-07 DIAGNOSIS — F4321 Adjustment disorder with depressed mood: Secondary | ICD-10-CM | POA: Diagnosis not present

## 2023-04-08 ENCOUNTER — Other Ambulatory Visit: Payer: Self-pay | Admitting: *Deleted

## 2023-04-08 ENCOUNTER — Telehealth: Payer: Self-pay | Admitting: *Deleted

## 2023-04-08 ENCOUNTER — Encounter: Payer: Medicare HMO | Admitting: Physical Therapy

## 2023-04-08 DIAGNOSIS — H348122 Central retinal vein occlusion, left eye, stable: Secondary | ICD-10-CM

## 2023-04-08 DIAGNOSIS — H35033 Hypertensive retinopathy, bilateral: Secondary | ICD-10-CM | POA: Diagnosis not present

## 2023-04-08 DIAGNOSIS — H43813 Vitreous degeneration, bilateral: Secondary | ICD-10-CM | POA: Diagnosis not present

## 2023-04-08 DIAGNOSIS — H34812 Central retinal vein occlusion, left eye, with macular edema: Secondary | ICD-10-CM | POA: Diagnosis not present

## 2023-04-08 NOTE — Telephone Encounter (Signed)
Called and left message 04/07/23 for Selena Batten, Dr. Alphonsus Sias nurse.  We received an order for a 14 day event monitor on this patient.  We suggested using a different diagnosis code as Retinal vein occlusion may not be covered.  If patients loss of sight was temporary, they might consider G45.3 Amaurosis Fugax.   As of 2:25PM 04/08/23, we have not heard back.  I will process order as received.

## 2023-04-12 ENCOUNTER — Encounter: Payer: Medicare HMO | Admitting: Physical Therapy

## 2023-04-13 ENCOUNTER — Other Ambulatory Visit: Payer: Self-pay | Admitting: Internal Medicine

## 2023-04-13 ENCOUNTER — Encounter: Payer: Self-pay | Admitting: Sports Medicine

## 2023-04-13 ENCOUNTER — Encounter: Payer: Self-pay | Admitting: Physical Therapy

## 2023-04-13 ENCOUNTER — Ambulatory Visit (INDEPENDENT_AMBULATORY_CARE_PROVIDER_SITE_OTHER): Payer: Medicare HMO | Admitting: Sports Medicine

## 2023-04-13 ENCOUNTER — Ambulatory Visit (INDEPENDENT_AMBULATORY_CARE_PROVIDER_SITE_OTHER): Payer: Medicare HMO

## 2023-04-13 ENCOUNTER — Ambulatory Visit: Payer: Medicare HMO | Admitting: Physical Therapy

## 2023-04-13 ENCOUNTER — Ambulatory Visit: Payer: Medicare HMO

## 2023-04-13 DIAGNOSIS — G8929 Other chronic pain: Secondary | ICD-10-CM | POA: Diagnosis not present

## 2023-04-13 DIAGNOSIS — R2689 Other abnormalities of gait and mobility: Secondary | ICD-10-CM | POA: Diagnosis not present

## 2023-04-13 DIAGNOSIS — M25552 Pain in left hip: Secondary | ICD-10-CM

## 2023-04-13 DIAGNOSIS — G453 Amaurosis fugax: Secondary | ICD-10-CM

## 2023-04-13 DIAGNOSIS — M5459 Other low back pain: Secondary | ICD-10-CM

## 2023-04-13 DIAGNOSIS — M25452 Effusion, left hip: Secondary | ICD-10-CM | POA: Diagnosis not present

## 2023-04-13 DIAGNOSIS — H348122 Central retinal vein occlusion, left eye, stable: Secondary | ICD-10-CM

## 2023-04-13 DIAGNOSIS — M25551 Pain in right hip: Secondary | ICD-10-CM

## 2023-04-13 DIAGNOSIS — S73192A Other sprain of left hip, initial encounter: Secondary | ICD-10-CM | POA: Diagnosis not present

## 2023-04-13 MED ORDER — TRIAMCINOLONE ACETONIDE 40 MG/ML IJ SUSP
40.0000 mg | Freq: Once | INTRAMUSCULAR | Status: AC
  Administered 2023-04-13: 40 mg via INTRAMUSCULAR

## 2023-04-13 MED ORDER — GADOBUTROL 1 MMOL/ML IV SOLN
1.0000 mL | Freq: Once | INTRAVENOUS | Status: AC | PRN
  Administered 2023-04-13: 1 mL via INTRAVENOUS

## 2023-04-13 NOTE — Addendum Note (Signed)
Addended by: Carren Rang A on: 04/13/2023 02:09 PM   Modules accepted: Orders

## 2023-04-13 NOTE — Therapy (Signed)
OUTPATIENT PHYSICAL THERAPY TREATMENT   Patient Name: Whitney Lewis MRN: [id number] DOB:May 18, 1961, 62 y.o., female Today's Date: 04/13/2023   PT End of Session - 04/13/23 1358     Visit Number 7    Number of Visits 16    Date for PT Re-Evaluation 04/26/23    Authorization Type [employer]    PT Start Time 1303    PT Stop Time 1345    PT Time Calculation (min) 42 min    Activity Tolerance Patient tolerated treatment well    Behavior During Therapy WFL for tasks assessed/performed                   Past Medical History:  Diagnosis Date   ADD (attention deficit disorder)    Anxiety    Arthritis    feet, knees (07/04/2015)   Bipolar disorder (HCC)    Complication of anesthesia    hard to wake up, blood pressure drops    Depression    Family history of adverse reaction to anesthesia    "sister hard to wake up also"   Fibromyalgia dx'd ~ 2000   GERD (gastroesophageal reflux disease)    Hyperlipidemia    Hypertension    Migraine    "hormonal; stopped when I was 16" (07/04/2015)   OSA (obstructive sleep apnea)    wears mouth piece, no CPAP   Seizures (HCC)    Past Surgical History:  Procedure Laterality Date   ABDOMINAL HYSTERECTOMY  ~ 1993   BACK SURGERY     CESAREAN SECTION  1987   COLONOSCOPY     COLONOSCOPY WITH PROPOFOL N/A 09/21/2017   Procedure: COLONOSCOPY WITH PROPOFOL;  Surgeon: Christena Deem, MD;  Location: [facility] ENDOSCOPY;  Service: Endoscopy;  Laterality: N/A;   HAMMER TOE SURGERY Right 03/2015   HAMMER TOE SURGERY Left 06/06/2015   Procedure: LEFT TWO-THREE  HAMMER TOE CORRECTION ;  Surgeon: Toni Arthurs, MD;  Location: [facility];  Service: Orthopedics;  Laterality: Left;   INCISION AND DRAINAGE OF WOUND Right 07/04/2015   Procedure: IRRIGATION AND DEBRIDEMENT OF RIGHT FOOT DEEP ABSCESS; EXCISION OF RIGHT SECOND METATARSAL HEAD; REMOVAL OF DEEP IMPLANT RIGHT FOOT; EXCISION OF RIGHT SECOND TOE PROXIMAL PHALANX BASE; INSERTION  OF ANTIBIOTIC BEADS INTO RIGHT FOOT ABSCESS;  Surgeon: Toni Arthurs, MD;  Location: [facility] OR;  Service: Orthopedics;  Laterality: Right;  SITE ALSO  RIGHT FOOT   IRRIGATION AND DEBRIDEMENT FOOT Right 07/04/2015   Irrigation and excisional debridement of right foot deep abscess;; Insertion of antibiotic beads into the right foot abscess   JOINT REPLACEMENT     MAXIMUM ACCESS (MAS)POSTERIOR LUMBAR INTERBODY FUSION (PLIF) 1 LEVEL N/A 01/01/2017   Procedure: L4-5 MAXIMUM ACCESS (MAS) POSTERIOR LUMBAR INTERBODY FUSION (PLIF);  Surgeon: Tia Alert, MD;  Location[facility] OR;  Service: Neurosurgery;  Laterality: N/A;   TOTAL KNEE ARTHROPLASTY Left    WEIL OSTEOTOMY Left 06/06/2015   Procedure: LEFT TWO AND THREE METATARSAL WEIL OSTEOTOMY ;  Surgeon: Toni Arthurs, MD;  Location: [facility];  Service: Orthopedics;  Laterality: Left;   Patient Active Problem List   Diagnosis Date Noted   Chronic left hip pain 04/13/2023   Metatarsal fracture 09/11/2021   S/P lumbar spinal fusion 01/01/2017   ADD (attention deficit disorder) 07/17/2015   Bipolar affective disorder (HCC) 07/17/2015   HLD (hyperlipidemia) 07/17/2015   BP (high blood pressure) 07/17/2015   Obstructive apnea 07/17/2015   Staphylococcus aureus infection    Foot  osteomyelitis, right (HCC) 07/04/2015    PCP: Alysia Penna, MD  REFERRING PROVIDER: [name]  REFERRING DIAG: R hip pain, low back pain   THERAPY DIAG:  Pain in right hip  Pain in left hip  Other abnormalities of gait and mobility  Other low back pain  ONSET DATE:    SUBJECTIVE:                                                                                                                                                                                           SUBJECTIVE STATEMENT: 04/13/2023  Pt was away last week. Had MRI of hip today. States back doing a little better, but still variable. Thinks she is maintaining, and maybe doing a little better.     03/01/2023:   Eval:   Pt states worsening discomfort in low back and R hip. And some pain in L hip. She feels very "off". She reports no pain in feet. She has been wearing Heel lift in R shoe since she was seen previously in PT. Now, without it, if she wears different shoes, she feels very uneven. Having a hard time finding shoes besides her sneakers that she can wear. She has been dong to [employer] with a trainer, and feels it has been helpful.  Pain: states Shooting pain into L thigh that makes leg buckle at times, intermittent. That is new. (Has had pain in L hip in the past)  Most pain is in R lateral and posterior hip,  and into bil low back/ bil glute region.   PERTINENT HISTORY:  L TKA,  Lumbar fusion L4/5 , previous foot and toe surgeries  PAIN:  Are you having pain? Yes NPRS scale: 6-7 /10 Pain location: R Hip and low back  Pain orientation: Right  PAIN TYPE: sore, painful Pain description: intermittent  Aggravating factors:  standing, walking, activity Relieving factors: rest    PRECAUTIONS: None  WEIGHT BEARING RESTRICTIONS No  FALLS:  Has patient fallen in last 6 months? No, Number of falls: 0  OCCUPATION: Retired,   PLOF: Independent  PATIENT GOALS  decreased pain in hip, back, be able to do more activity without pain.  Feel more "even" with other shoes .    OBJECTIVE:   DIAGNOSTIC FINDINGS: waiting on read for back and R hip.    COGNITION:  Overall cognitive status: Within functional limits for tasks assessed   POSTURE:  Standing: L knee in slight flexion, Increased weight bearing on R hip, slight fwd flexion of trunk.   Unable to achieve full upright trunk posture, some due to L hip stiffness.  Supine: R  LE shorter,    PALPATION: R hip: tenderness in posterior and lateral hip, bil glutes, R SI,  Significant limitation and hypomobility for L hip for ext, IR, flex, ER,  Mild limitation for L knee flex and ext (baseline from prev surgery)  Lumbar  extension limited secondary to L hip extension limitation.    ROM AROM-    Lumbar: extension limited, secondary to L hip extension limitation L knee: lacking full flexion and extension (from previous TKA) ; Limited DF bil;  L hip: significant limitation for ext>IR>flex>ER.    MMT: Knees: 4+5,  Hips: 4-/5 on L,   4/5 on R   LUMBAR SPECIAL TESTS:  Neg SLR bil;    GAIT: Decreased hip ext on L with decreased terminal stance position    TODAY'S TREATMENT   04/13/23: Therapeutic Exercise: Aerobic: bike L2  x 5 min Supine:  pelvic tilts x 15;   Hip ER fallouts x 10, 5 sec;    SLR x 10 bil;  Prone press ups to elbows  2 x 10  Seated: sit to stand x 12;  Standing: Stretches:  cat/cow x 15;  Neuromuscular Re-education: Manual Therapy:     Long leg distraction on L for hip;  Lumbar PA mobilizations , prone L knee flexion and L hip extension PROM;  supine L hip flexion PROM.  Therapeutic Activity: Self Care:    03/25/23: Therapeutic Exercise: Aerobic: bike L1  x 6 min Supine:  Hip ER fallouts x 10, 5 sec; supine clams Gtb x 20;  bridging x 15;  SKTC 20 sec x 3 bil; SLR x 20 bil (no pain on L today)  Seated: Standing: bwd walking 10 ft x 6 in hallway; step ups fwd, lateral 12 in x 10 ea bil, no UE support; squats 2 x 10  Stretches:  Neuromuscular Re-education: Manual Therapy:     Therapeutic Activity: Self Care:    03/22/23: Therapeutic Exercise: Aerobic: bike L1  x 5 min Supine:  Hip ER fallouts x 10, 5 sec; supine clams Gtb x 20;  bridging x 15; SKTC 20 sec x 3 bil; SLR x 10 bil (pain on L)  Seated: Standing:   Stretches:  Neuromuscular Re-education: Manual Therapy:   Long leg distraction for L hip and R lumbar;   PROM for L hip, all motions.  Therapeutic Activity: Self Care:    03/18/23 Therapeutic Exercise: Aerobic: Supine: SKTC 30 sec x 5;   Hip IR ROM x 10 bil; supine clams GTB x 20; bridging 2 x 10;  Prone: prone on elbows stretch- for anterior hip  stretch  Seated: S/L:  Hip abd 2 x 10 bil;  Standing:  lumbar extension ( for back and hip extension x 10)  Stretches:  LTR x 10;   Neuromuscular Re-education: Manual Therapy: Hip Inf and post mobs, inf and lat mobs with strap,  Long leg distraction for L hip;  PROM for L hip, all motions.  Therapeutic Activity: Self Care:     PATIENT EDUCATION:  Education details: updated and reviewed HEP  Person educated: Patient Education method: Explanation, Demonstration, Tactile cues, Verbal cues, and Handouts Education comprehension: verbalized understanding, returned demonstration, verbal cues required, tactile cues required, and needs further education   HOME EXERCISE PROGRAM: Access Code: [email or web address](previous) Access Code: [email or web address]  new    ASSESSMENT:  CLINICAL IMPRESSION: 04/13/2023  Pt with slight improvement of lumbar extension, seen with prone press ups as well as with ability for more upright posture in standing. Focus  today on continuing hip and back mobility. Plan to progress mobility and strength as tolerated.   Eval:  Pt presents with primary complaint of pain in R hip and bil low back. She has inability for full upright posture, seemingly due to significant stiffness noted in L hip. She has significant limitation for L hip ROM, and is standing with slight flexion in L hip and knee, with increased weight bearing/shift onto R side. She has increased pain in R hip, bil glutes, and increased tightness in R lumbar region.  She has mild leg length discrepancy, with R being shorter than L, which does seems to be effecting her quite a bit. She has had multiple foot surgeries for toes, but is not having pain in feet at this time. We will discuss optimal footwear, and possible need for shoe lift for other/dress shoes besides sneakers so she can wear this more /full time. She has quite a bit of asymmetry effecting posture and pain. Will focus on L hip mobility, improving lumbar posture, for  decreasing back pain and hip pain, as well as improving gait mechanics. Pt with decreased ability for full functional activities and will benefit from skilled PT to improve.    OBJECTIVE IMPAIRMENTS Abnormal gait, decreased activity tolerance, decreased balance, decreased knowledge of use of DME, decreased mobility, difficulty walking, decreased ROM, decreased strength, increased muscle spasms, impaired flexibility, improper body mechanics, and pain.   ACTIVITY LIMITATIONS meal prep, cleaning, laundry, shopping, community activity, and yard work.   PERSONAL FACTORS Past/current experiences /foot surgeries, L hip stiffness, leg length discrepancy, are effecting pts outcome.  REHAB POTENTIAL: Good  CLINICAL DECISION MAKING: Evolving/moderate complexity  EVALUATION COMPLEXITY: Moderate   GOALS: Goals reviewed with patient? Yes  SHORT TERM GOALS: Target date: 03/15/2023  Patient to be independent with initial HEP  Goal status: MET    LONG TERM GOALS: Target date: 04/25/2023  Patient to be independent with final HEP  Goal status: INITIAL  2.  Patient to demo improved ROM of L hip to be at max ability, to improve ability for more upright posture and gait.   Goal status: INITIAL  3.  Patient to demo improved strength of  bil hips to 4+5,  to improve stability and pain  Goal status: INITIAL   4.  Pt to obtain and be complaint with footwear/heel lift as appropriate for her leg length discrepancy, to allow for improved gait pattern.   Goal status: INITIAL   PLAN: PT FREQUENCY: 1-2x/week  PT DURATION: 8 weeks  PLANNED INTERVENTIONS: Therapeutic exercises, Therapeutic activity, Neuromuscular re-education, Balance training, Gait training, Patient/Family education, Self Care, Joint mobilization, Joint manipulation, Stair training, Orthotic/Fit training, DME instructions, Dry Needling, Electrical stimulation, Spinal mobilization, Moist heat, Taping, Traction, Ultrasound,  Ionotophoresis 4mg /ml Dexamethasone, Manual therapy, and Neuro Muscular re-education  PLAN FOR NEXT SESSION:  L hip mobility, manual for bil glute and low back pain, shoe lift recommendations,     Sedalia Muta, PT, DPT 1:59 PM  04/13/23

## 2023-04-13 NOTE — Progress Notes (Signed)
    Procedures performed today:    Procedure: Real-time Ultrasound Guided gadolinium contrast injection of left hip joint Device: Samsung HS60  Verbal informed consent obtained.  Time-out conducted.  Noted no overlying erythema, induration, or other signs of local infection.  Skin prepped in a sterile fashion.  Local anesthesia: Topical Ethyl chloride.  With sterile technique and under real time ultrasound guidance: Noted arthritic joint, 22-gauge spinal needle advanced to the femoral head/neck junction, contacted bone, I then injected 1 cc kenalog 40, 2 cc lidocaine, 2 cc bupivacaine, syringe again switched and 0.1 cc gadolinium injected, syringe again switched and 10 cc sterile saline used to fully distend the joint. Joint visualized and capsule seen distending confirming intra-articular placement of contrast material and medication. Completed without difficulty  Advised to call if fevers/chills, erythema, induration, drainage, or persistent bleeding.  Images permanently stored in PACS Impression: Technically successful ultrasound guided gadolinium contrast injection for MR arthrography.  Please see separate MR arthrogram report.  Independent interpretation of notes and tests performed by another provider:   None.  Brief History, Exam, Impression, and Recommendations:    Chronic left hip pain Pleasant 62 year old female, chronic hip pain, x-ray confirmed osteoarthritis, referred to me for injection for MR arthrography, performed today, further management per primary treating provider.    ____________________________________________ Ihor Austin. Benjamin Stain, M.D., ABFM., CAQSM., AME. Primary Care and Sports Medicine [facility]  Adjunct Professor of Family Medicine  [employer]  Restaurant manager, fast food

## 2023-04-13 NOTE — Assessment & Plan Note (Signed)
Pleasant 62 year old female, chronic hip pain, x-ray confirmed osteoarthritis, referred to me for injection for MR arthrography, performed today, further management per primary treating provider.

## 2023-04-14 DIAGNOSIS — J342 Deviated nasal septum: Secondary | ICD-10-CM | POA: Diagnosis not present

## 2023-04-14 DIAGNOSIS — J329 Chronic sinusitis, unspecified: Secondary | ICD-10-CM | POA: Diagnosis not present

## 2023-04-14 DIAGNOSIS — G453 Amaurosis fugax: Secondary | ICD-10-CM

## 2023-04-14 DIAGNOSIS — R519 Headache, unspecified: Secondary | ICD-10-CM | POA: Diagnosis not present

## 2023-04-15 ENCOUNTER — Encounter: Payer: Self-pay | Admitting: Physical Therapy

## 2023-04-15 ENCOUNTER — Ambulatory Visit: Payer: Medicare HMO | Admitting: Physical Therapy

## 2023-04-15 DIAGNOSIS — R2689 Other abnormalities of gait and mobility: Secondary | ICD-10-CM | POA: Diagnosis not present

## 2023-04-15 DIAGNOSIS — H348122 Central retinal vein occlusion, left eye, stable: Secondary | ICD-10-CM | POA: Diagnosis not present

## 2023-04-15 DIAGNOSIS — M25551 Pain in right hip: Secondary | ICD-10-CM

## 2023-04-15 DIAGNOSIS — M25552 Pain in left hip: Secondary | ICD-10-CM | POA: Diagnosis not present

## 2023-04-15 DIAGNOSIS — M5459 Other low back pain: Secondary | ICD-10-CM

## 2023-04-15 DIAGNOSIS — G453 Amaurosis fugax: Secondary | ICD-10-CM | POA: Diagnosis not present

## 2023-04-15 NOTE — Therapy (Signed)
OUTPATIENT PHYSICAL THERAPY TREATMENT   Patient Name: Whitney Lewis MRN: [id number] DOB:[birth year], 62 y.o., female Today's Date: 04/15/2023   PT End of Session - 04/15/23 1306     Visit Number 8    Number of Visits 16    Date for PT Re-Evaluation 04/26/23    Authorization Type [employer]    PT Start Time 1307    PT Stop Time 1345    PT Time Calculation (min) 38 min    Activity Tolerance Patient tolerated treatment well    Behavior During Therapy WFL for tasks assessed/performed              Past Medical History:  Diagnosis Date   ADD (attention deficit disorder)    Anxiety    Arthritis    feet, knees (07/04/2015)   Bipolar disorder (HCC)    Complication of anesthesia    hard to wake up, blood pressure drops    Depression    Family history of adverse reaction to anesthesia    "sister hard to wake up also"   Fibromyalgia dx'd ~ 2000   GERD (gastroesophageal reflux disease)    Hyperlipidemia    Hypertension    Migraine    "hormonal; stopped when I was 16" (07/04/2015)   OSA (obstructive sleep apnea)    wears mouth piece, no CPAP   Seizures (HCC)    Past Surgical History:  Procedure Laterality Date   ABDOMINAL HYSTERECTOMY  ~ 1993   BACK SURGERY     CESAREAN SECTION  1987   COLONOSCOPY     COLONOSCOPY WITH PROPOFOL N/A 09/21/2017   Procedure: COLONOSCOPY WITH PROPOFOL;  Surgeon: Christena Deem, MD;  Location: [facility] ENDOSCOPY;  Service: Endoscopy;  Laterality: N/A;   HAMMER TOE SURGERY Right 03/2015   HAMMER TOE SURGERY Left 06/06/2015   Procedure: LEFT TWO-THREE  HAMMER TOE CORRECTION ;  Surgeon: Toni Arthurs, MD;  Location: [facility];  Service: Orthopedics;  Laterality: Left;   INCISION AND DRAINAGE OF WOUND Right 07/04/2015   Procedure: IRRIGATION AND DEBRIDEMENT OF RIGHT FOOT DEEP ABSCESS; EXCISION OF RIGHT SECOND METATARSAL HEAD; REMOVAL OF DEEP IMPLANT RIGHT FOOT; EXCISION OF RIGHT SECOND TOE PROXIMAL PHALANX BASE; INSERTION OF  ANTIBIOTIC BEADS INTO RIGHT FOOT ABSCESS;  Surgeon: Toni Arthurs, MD;  Location: [facility] OR;  Service: Orthopedics;  Laterality: Right;  SITE ALSO  RIGHT FOOT   IRRIGATION AND DEBRIDEMENT FOOT Right 07/04/2015   Irrigation and excisional debridement of right foot deep abscess;; Insertion of antibiotic beads into the right foot abscess   JOINT REPLACEMENT     MAXIMUM ACCESS (MAS)POSTERIOR LUMBAR INTERBODY FUSION (PLIF) 1 LEVEL N/A 01/01/2017   Procedure: L4-5 MAXIMUM ACCESS (MAS) POSTERIOR LUMBAR INTERBODY FUSION (PLIF);  Surgeon: Tia Alert, MD;  Location: [facility] OR;  Service: Neurosurgery;  Laterality: N/A;   TOTAL KNEE ARTHROPLASTY Left    WEIL OSTEOTOMY Left 06/06/2015   Procedure: LEFT TWO AND THREE METATARSAL WEIL OSTEOTOMY ;  Surgeon: Toni Arthurs, MD;  Location: [facility];  Service: Orthopedics;  Laterality: Left;   Patient Active Problem List   Diagnosis Date Noted   Chronic left hip pain 04/13/2023   Metatarsal fracture 09/11/2021   S/P lumbar spinal fusion 01/01/2017   ADD (attention deficit disorder) 07/17/2015   Bipolar affective disorder (HCC) 07/17/2015   HLD (hyperlipidemia) 07/17/2015   BP (high blood pressure) 07/17/2015   Obstructive apnea 07/17/2015   Staphylococcus aureus infection    Foot osteomyelitis, right (HCC) 07/04/2015  PCP: Alysia Penna, MD  REFERRING PROVIDER: [name]  REFERRING DIAG: R hip pain, low back pain   THERAPY DIAG:  Pain in right hip  Pain in left hip  Other low back pain  Other abnormalities of gait and mobility  ONSET DATE:    SUBJECTIVE:                                                                                                                                                                                           SUBJECTIVE STATEMENT: 04/15/2023  Pt states she had a pain medication injection on Tuesday and she has noticed less pain rather than increased ROM in her L hip.     03/01/2023:   Eval:   Pt  states worsening discomfort in low back and R hip. And some pain in L hip. She feels very "off". She reports no pain in feet. She has been wearing Heel lift in R shoe since she was seen previously in PT. Now, without it, if she wears different shoes, she feels very uneven. Having a hard time finding shoes besides her sneakers that she can wear. She has been dong to [employer] with a trainer, and feels it has been helpful.  Pain: states Shooting pain into L thigh that makes leg buckle at times, intermittent. That is new. (Has had pain in L hip in the past)  Most pain is in R lateral and posterior hip,  and into bil low back/ bil glute region.   PERTINENT HISTORY:  L TKA,  Lumbar fusion L4/5 , previous foot and toe surgeries  PAIN:  Are you having pain? Yes NPRS scale: 6-7 /10 Pain location: R Hip and low back  Pain orientation: Right  PAIN TYPE: sore, painful Pain description: intermittent  Aggravating factors:  standing, walking, activity Relieving factors: rest    PRECAUTIONS: None  WEIGHT BEARING RESTRICTIONS No  FALLS:  Has patient fallen in last 6 months? No, Number of falls: 0  OCCUPATION: Retired,   PLOF: Independent  PATIENT GOALS  decreased pain in hip, back, be able to do more activity without pain.  Feel more "even" with other shoes .    OBJECTIVE:   DIAGNOSTIC FINDINGS: waiting on read for back and R hip.    COGNITION:  Overall cognitive status: Within functional limits for tasks assessed   POSTURE:  Standing: L knee in slight flexion, Increased weight bearing on R hip, slight fwd flexion of trunk.   Unable to achieve full upright trunk posture, some due to L hip stiffness.  Supine: R LE shorter,    PALPATION: R hip: tenderness in posterior  and lateral hip, bil glutes, R SI,  Significant limitation and hypomobility for L hip for ext, IR, flex, ER,  Mild limitation for L knee flex and ext (baseline from prev surgery)  Lumbar extension limited secondary to L  hip extension limitation.    ROM AROM-    Lumbar: extension limited, secondary to L hip extension limitation L knee: lacking full flexion and extension (from previous TKA) ; Limited DF bil;  L hip: significant limitation for ext>IR>flex>ER.    MMT: Knees: 4+5,  Hips: 4-/5 on L,   4/5 on R   LUMBAR SPECIAL TESTS:  Neg SLR bil;    GAIT: Decreased hip ext on L with decreased terminal stance position    TODAY'S TREATMENT   04/15/2023 Therapeutic Exercise: Aerobic: bike L2  x 5 min Supine:  pelvic tilts x 15;  Hip ER fallouts x 10, 5 sec;  Seated:  S/L: Side planks bil 3x20"  Standing: Squats x12; Squats and press, 10# 2x15; Side-steps x4 laps RTB; Pallof Press 2x12 RTB; Step up with knee drive bil K44, 01# Stretches:  Neuromuscular Re-education: Manual Therapy:     Therapeutic Activity: Self Care:    04/13/23: Therapeutic Exercise: Aerobic: bike L2  x 5 min Supine:  pelvic tilts x 15;   Hip ER fallouts x 10, 5 sec;    SLR x 10 bil;  Prone press ups to elbows  2 x 10  Seated: sit to stand x 12;  Standing: Stretches:  cat/cow x 15;  Neuromuscular Re-education: Manual Therapy:     Long leg distraction on L for hip;  Lumbar PA mobilizations , prone L knee flexion and L hip extension PROM;  supine L hip flexion PROM.  Therapeutic Activity: Self Care:    03/25/23: Therapeutic Exercise: Aerobic: bike L1  x 6 min Supine:  Hip ER fallouts x 10, 5 sec; supine clams Gtb x 20;  bridging x 15;  SKTC 20 sec x 3 bil; SLR x 20 bil (no pain on L today)  Seated: Standing: bwd walking 10 ft x 6 in hallway; step ups fwd, lateral 12 in x 10 ea bil, no UE support; squats 2 x 10  Stretches:  Neuromuscular Re-education: Manual Therapy:     Therapeutic Activity: Self Care:    03/22/23: Therapeutic Exercise: Aerobic: bike L1  x 5 min Supine:  Hip ER fallouts x 10, 5 sec; supine clams Gtb x 20;  bridging x 15; SKTC 20 sec x 3 bil; SLR x 10 bil (pain on L)  Seated: Standing:    Stretches:  Neuromuscular Re-education: Manual Therapy:   Long leg distraction for L hip and R lumbar;   PROM for L hip, all motions.  Therapeutic Activity: Self Care:    PATIENT EDUCATION:  Education details: updated and reviewed HEP  Person educated: Patient Education method: Explanation, Demonstration, Tactile cues, Verbal cues, and Handouts Education comprehension: verbalized understanding, returned demonstration, verbal cues required, tactile cues required, and needs further education   HOME EXERCISE PROGRAM: Access Code: [email or web address](previous) Access Code: [email or web address]  new    ASSESSMENT:  CLINICAL IMPRESSION: 04/15/2023  Session focused on theract to promote return to functional goals and gym routine and NMRE to promote core activation during standing/dynamic tasks. Pt showed mark improvement in her squatting mechanics, LEs strength, and bodily mechanics. Pt continues to have difficulty with muscular endurance and fatigued after multiple sets of dynamic interventions. Continue to promote global LEs muscular endurance and strength as tolerated. Pt able to perform all  exercises without increased pain or modification.      Eval:  Pt presents with primary complaint of pain in R hip and bil low back. She has inability for full upright posture, seemingly due to significant stiffness noted in L hip. She has significant limitation for L hip ROM, and is standing with slight flexion in L hip and knee, with increased weight bearing/shift onto R side. She has increased pain in R hip, bil glutes, and increased tightness in R lumbar region.  She has mild leg length discrepancy, with R being shorter than L, which does seems to be effecting her quite a bit. She has had multiple foot surgeries for toes, but is not having pain in feet at this time. We will discuss optimal footwear, and possible need for shoe lift for other/dress shoes besides sneakers so she can wear this more /full time. She has quite  a bit of asymmetry effecting posture and pain. Will focus on L hip mobility, improving lumbar posture, for decreasing back pain and hip pain, as well as improving gait mechanics. Pt with decreased ability for full functional activities and will benefit from skilled PT to improve.    OBJECTIVE IMPAIRMENTS Abnormal gait, decreased activity tolerance, decreased balance, decreased knowledge of use of DME, decreased mobility, difficulty walking, decreased ROM, decreased strength, increased muscle spasms, impaired flexibility, improper body mechanics, and pain.   ACTIVITY LIMITATIONS meal prep, cleaning, laundry, shopping, community activity, and yard work.   PERSONAL FACTORS Past/current experiences /foot surgeries, L hip stiffness, leg length discrepancy, are effecting pts outcome.  REHAB POTENTIAL: Good  CLINICAL DECISION MAKING: Evolving/moderate complexity  EVALUATION COMPLEXITY: Moderate   GOALS: Goals reviewed with patient? Yes  SHORT TERM GOALS: Target date: 03/15/2023  Patient to be independent with initial HEP  Goal status: MET    LONG TERM GOALS: Target date: 04/25/2023  Patient to be independent with final HEP  Goal status: INITIAL  2.  Patient to demo improved ROM of L hip to be at max ability, to improve ability for more upright posture and gait.   Goal status: INITIAL  3.  Patient to demo improved strength of  bil hips to 4+5,  to improve stability and pain  Goal status: INITIAL   4.  Pt to obtain and be complaint with footwear/heel lift as appropriate for her leg length discrepancy, to allow for improved gait pattern.   Goal status: INITIAL   PLAN: PT FREQUENCY: 1-2x/week  PT DURATION: 8 weeks  PLANNED INTERVENTIONS: Therapeutic exercises, Therapeutic activity, Neuromuscular re-education, Balance training, Gait training, Patient/Family education, Self Care, Joint mobilization, Joint manipulation, Stair training, Orthotic/Fit training, DME instructions, Dry  Needling, Electrical stimulation, Spinal mobilization, Moist heat, Taping, Traction, Ultrasound, Ionotophoresis 4mg /ml Dexamethasone, Manual therapy, and Neuro Muscular re-education  PLAN FOR NEXT SESSION:  L hip mobility, manual for bil glute and low back pain, shoe lift recommendations,    Nechama Guard, SPT    This entire session was performed under direct supervision and direction of a licensed Estate agent . I have personally read, edited and approve of the note as written.  Sedalia Muta, PT, DPT 4:03 PM  04/15/23

## 2023-04-19 ENCOUNTER — Encounter: Payer: Self-pay | Admitting: Physical Therapy

## 2023-04-19 ENCOUNTER — Ambulatory Visit: Payer: Medicare HMO | Admitting: Physical Therapy

## 2023-04-19 DIAGNOSIS — M5459 Other low back pain: Secondary | ICD-10-CM

## 2023-04-19 DIAGNOSIS — M25551 Pain in right hip: Secondary | ICD-10-CM | POA: Diagnosis not present

## 2023-04-19 DIAGNOSIS — R2689 Other abnormalities of gait and mobility: Secondary | ICD-10-CM | POA: Diagnosis not present

## 2023-04-19 DIAGNOSIS — M25552 Pain in left hip: Secondary | ICD-10-CM

## 2023-04-19 NOTE — Therapy (Signed)
OUTPATIENT PHYSICAL THERAPY TREATMENT   Patient Name: Whitney Lewis MRN: [id number] DOB:03-24-1961, 62 y.o., female Today's Date: 04/19/2023   PT End of Session - 04/19/23 1126     Visit Number 9    Number of Visits 16    Date for PT Re-Evaluation 04/26/23    Authorization Type [employer]    PT Start Time [phone]    PT Stop Time 1016    PT Time Calculation (min) 38 min    Activity Tolerance Patient tolerated treatment well;No increased pain    Behavior During Therapy WFL for tasks assessed/performed               Past Medical History:  Diagnosis Date   ADD (attention deficit disorder)    Anxiety    Arthritis    feet, knees (07/04/2015)   Bipolar disorder (HCC)    Complication of anesthesia    hard to wake up, blood pressure drops    Depression    Family history of adverse reaction to anesthesia    "sister hard to wake up also"   Fibromyalgia dx'd ~ 2000   GERD (gastroesophageal reflux disease)    Hyperlipidemia    Hypertension    Migraine    "hormonal; stopped when I was 16" (07/04/2015)   OSA (obstructive sleep apnea)    wears mouth piece, no CPAP   Seizures (HCC)    Past Surgical History:  Procedure Laterality Date   ABDOMINAL HYSTERECTOMY  ~ 1993   BACK SURGERY     CESAREAN SECTION  1987   COLONOSCOPY     COLONOSCOPY WITH PROPOFOL N/A 09/21/2017   Procedure: COLONOSCOPY WITH PROPOFOL;  Surgeon: Christena Deem, MD;  Location: [facility] ENDOSCOPY;  Service: Endoscopy;  Laterality: N/A;   HAMMER TOE SURGERY Right 03/2015   HAMMER TOE SURGERY Left 06/06/2015   Procedure: LEFT TWO-THREE  HAMMER TOE CORRECTION ;  Surgeon: Toni Arthurs, MD;  Location: [facility];  Service: Orthopedics;  Laterality: Left;   INCISION AND DRAINAGE OF WOUND Right 07/04/2015   Procedure: IRRIGATION AND DEBRIDEMENT OF RIGHT FOOT DEEP ABSCESS; EXCISION OF RIGHT SECOND METATARSAL HEAD; REMOVAL OF DEEP IMPLANT RIGHT FOOT; EXCISION OF RIGHT SECOND TOE PROXIMAL PHALANX BASE;  INSERTION OF ANTIBIOTIC BEADS INTO RIGHT FOOT ABSCESS;  Surgeon: Toni Arthurs, MD;  Location: [facility] OR;  Service: Orthopedics;  Laterality: Right;  SITE ALSO  RIGHT FOOT   IRRIGATION AND DEBRIDEMENT FOOT Right 07/04/2015   Irrigation and excisional debridement of right foot deep abscess;; Insertion of antibiotic beads into the right foot abscess   JOINT REPLACEMENT     MAXIMUM ACCESS (MAS)POSTERIOR LUMBAR INTERBODY FUSION (PLIF) 1 LEVEL N/A 01/01/2017   Procedure: L4-5 MAXIMUM ACCESS (MAS) POSTERIOR LUMBAR INTERBODY FUSION (PLIF);  Surgeon: Tia Alert, MD;  Location: [facility];  Service: Neurosurgery;  Laterality: N/A;   TOTAL KNEE ARTHROPLASTY Left    WEIL OSTEOTOMY Left 06/06/2015   Procedure: LEFT TWO AND THREE METATARSAL WEIL OSTEOTOMY ;  Surgeon: Toni Arthurs, MD;  Location: [facility];  Service: Orthopedics;  Laterality: Left;   Patient Active Problem List   Diagnosis Date Noted   Chronic left hip pain 04/13/2023   Metatarsal fracture 09/11/2021   S/P lumbar spinal fusion 01/01/2017   ADD (attention deficit disorder) 07/17/2015   Bipolar affective disorder (HCC) 07/17/2015   HLD (hyperlipidemia) 07/17/2015   BP (high blood pressure) 07/17/2015   Obstructive apnea 07/17/2015   Staphylococcus aureus infection    Foot osteomyelitis, right (  HCC) 07/04/2015    PCP: Alysia Penna, MD  REFERRING PROVIDER: [name]  REFERRING DIAG: R hip pain, low back pain   THERAPY DIAG:  Pain in right hip  Other low back pain  Pain in left hip  Other abnormalities of gait and mobility  ONSET DATE:    SUBJECTIVE:                                                                                                                                                                                           SUBJECTIVE STATEMENT: 04/19/2023  Pt states her corticosteroid injection she received last week has been wearing off over the weekend, but she had 4 days of good pain  relief.    03/01/2023:   Eval:   Pt states worsening discomfort in low back and R hip. And some pain in L hip. She feels very "off". She reports no pain in feet. She has been wearing Heel lift in R shoe since she was seen previously in PT. Now, without it, if she wears different shoes, she feels very uneven. Having a hard time finding shoes besides her sneakers that she can wear. She has been dong to [employer] with a trainer, and feels it has been helpful.  Pain: states Shooting pain into L thigh that makes leg buckle at times, intermittent. That is new. (Has had pain in L hip in the past)  Most pain is in R lateral and posterior hip,  and into bil low back/ bil glute region.   PERTINENT HISTORY:  L TKA,  Lumbar fusion L4/5 , previous foot and toe surgeries  PAIN:  Are you having pain? Yes NPRS scale: 6-7 /10 Pain location: R Hip and low back  Pain orientation: Right  PAIN TYPE: sore, painful Pain description: intermittent  Aggravating factors:  standing, walking, activity Relieving factors: rest    PRECAUTIONS: None  WEIGHT BEARING RESTRICTIONS No  FALLS:  Has patient fallen in last 6 months? No, Number of falls: 0  OCCUPATION: Retired,   PLOF: Independent  PATIENT GOALS  decreased pain in hip, back, be able to do more activity without pain.  Feel more "even" with other shoes .    OBJECTIVE:   DIAGNOSTIC FINDINGS: waiting on read for back and R hip.    COGNITION:  Overall cognitive status: Within functional limits for tasks assessed   POSTURE:  Standing: L knee in slight flexion, Increased weight bearing on R hip, slight fwd flexion of trunk.   Unable to achieve full upright trunk posture, some due to L hip stiffness.  Supine: R LE shorter,    PALPATION:  R hip: tenderness in posterior and lateral hip, bil glutes, R SI,  Significant limitation and hypomobility for L hip for ext, IR, flex, ER,  Mild limitation for L knee flex and ext (baseline from prev surgery)   Lumbar extension limited secondary to L hip extension limitation.    ROM AROM-    Lumbar: extension limited, secondary to L hip extension limitation L knee: lacking full flexion and extension (from previous TKA) ; Limited DF bil;  L hip: significant limitation for ext>IR>flex>ER.    MMT: Knees: 4+5,  Hips: 4-/5 on L,   4/5 on R   LUMBAR SPECIAL TESTS:  Neg SLR bil;    GAIT: Decreased hip ext on L with decreased terminal stance position    TODAY'S TREATMENT   04/19/2023 Therapeutic Exercise: Aerobic: bike L2  x 5 min Supine:  pelvic tilts x 15;  Hip ER fallouts x 10, 5 sec;  Seated:  S/L: Side planks bil 3x20"  Prone: Prone press up 12x5" for back and hip mobility  Standing:  Squats and press, 10# 2x15; Side-steps x4 laps RTB;  Stretches: Kneeling hip mobilization bil 15x5" Neuromuscular Re-education: Manual Therapy:     Therapeutic Activity: Self Care:  Previous Therapeutic Exercise: Aerobic: bike L2  x 5 min Supine:  pelvic tilts x 15;  Hip ER fallouts x 10, 5 sec;  Seated:  S/L: Side planks bil 3x20"  Standing: Squats x12; Squats and press, 10# 2x15; Side-steps x4 laps RTB; Pallof Press 2x12 RTB; Step up with knee drive bil Y86, 57# Stretches:  Neuromuscular Re-education: Manual Therapy:     Therapeutic Activity: Self Care:  04/13/23: Therapeutic Exercise: Aerobic: bike L2  x 5 min Supine:  pelvic tilts x 15;   Hip ER fallouts x 10, 5 sec;    SLR x 10 bil;  Prone press ups to elbows  2 x 10  Seated: sit to stand x 12;  Standing: Stretches:  cat/cow x 15;  Neuromuscular Re-education: Manual Therapy:     Long leg distraction on L for hip;  Lumbar PA mobilizations , prone L knee flexion and L hip extension PROM;  supine L hip flexion PROM.  Therapeutic Activity: Self Care:    03/25/23: Therapeutic Exercise: Aerobic: bike L1  x 6 min Supine:  Hip ER fallouts x 10, 5 sec; supine clams Gtb x 20;  bridging x 15;  SKTC 20 sec x 3 bil; SLR x 20 bil (no  pain on L today)  Seated: Standing: bwd walking 10 ft x 6 in hallway; step ups fwd, lateral 12 in x 10 ea bil, no UE support; squats 2 x 10  Stretches:  Neuromuscular Re-education: Manual Therapy:     Therapeutic Activity: Self Care:    PATIENT EDUCATION:  Education details: updated and reviewed HEP  Person educated: Patient Education method: Explanation, Demonstration, Tactile cues, Verbal cues, and Handouts Education comprehension: verbalized understanding, returned demonstration, verbal cues required, tactile cues required, and needs further education   HOME EXERCISE PROGRAM: Access Code: [email or web address](previous) Access Code: [email or web address]  new    ASSESSMENT:  CLINICAL IMPRESSION: 04/19/2023  Session focused on theract to promote return to functional goals and ther ex to promote lumbopelvic and hip mobility. Pt continues to show improvement in her squat mechanics by displaying improved depth, even weight bearing through Bil LEs, and appropriate posture. Pt continues to have difficulty with lumbopelvic mobility secondary to lumbar and hip stiffness- although steady progress is being made. Continue to address lumbopelvic mobility with active  mobilizations and LEs strengthening as tolerated. Pt able to perform all exercises without increased pain or modification.     Eval:  Pt presents with primary complaint of pain in R hip and bil low back. She has inability for full upright posture, seemingly due to significant stiffness noted in L hip. She has significant limitation for L hip ROM, and is standing with slight flexion in L hip and knee, with increased weight bearing/shift onto R side. She has increased pain in R hip, bil glutes, and increased tightness in R lumbar region.  She has mild leg length discrepancy, with R being shorter than L, which does seems to be effecting her quite a bit. She has had multiple foot surgeries for toes, but is not having pain in feet at this time. We will discuss  optimal footwear, and possible need for shoe lift for other/dress shoes besides sneakers so she can wear this more /full time. She has quite a bit of asymmetry effecting posture and pain. Will focus on L hip mobility, improving lumbar posture, for decreasing back pain and hip pain, as well as improving gait mechanics. Pt with decreased ability for full functional activities and will benefit from skilled PT to improve.    OBJECTIVE IMPAIRMENTS Abnormal gait, decreased activity tolerance, decreased balance, decreased knowledge of use of DME, decreased mobility, difficulty walking, decreased ROM, decreased strength, increased muscle spasms, impaired flexibility, improper body mechanics, and pain.   ACTIVITY LIMITATIONS meal prep, cleaning, laundry, shopping, community activity, and yard work.   PERSONAL FACTORS Past/current experiences /foot surgeries, L hip stiffness, leg length discrepancy, are effecting pts outcome.  REHAB POTENTIAL: Good  CLINICAL DECISION MAKING: Evolving/moderate complexity  EVALUATION COMPLEXITY: Moderate   GOALS: Goals reviewed with patient? Yes  SHORT TERM GOALS: Target date: 03/15/2023  Patient to be independent with initial HEP  Goal status: MET    LONG TERM GOALS: Target date: 04/25/2023  Patient to be independent with final HEP  Goal status: INITIAL  2.  Patient to demo improved ROM of L hip to be at max ability, to improve ability for more upright posture and gait.   Goal status: INITIAL  3.  Patient to demo improved strength of  bil hips to 4+5,  to improve stability and pain  Goal status: INITIAL   4.  Pt to obtain and be complaint with footwear/heel lift as appropriate for her leg length discrepancy, to allow for improved gait pattern.   Goal status: INITIAL   PLAN: PT FREQUENCY: 1-2x/week  PT DURATION: 8 weeks  PLANNED INTERVENTIONS: Therapeutic exercises, Therapeutic activity, Neuromuscular re-education, Balance training, Gait  training, Patient/Family education, Self Care, Joint mobilization, Joint manipulation, Stair training, Orthotic/Fit training, DME instructions, Dry Needling, Electrical stimulation, Spinal mobilization, Moist heat, Taping, Traction, Ultrasound, Ionotophoresis 4mg /ml Dexamethasone, Manual therapy, and Neuro Muscular re-education  PLAN FOR NEXT SESSION:     Nechama Guard, SPT    This entire session was performed under direct supervision and direction of a licensed Estate agent . I have personally read, edited and approve of the note as written.  Sedalia Muta, PT, DPT 11:36 AM  04/19/23

## 2023-04-21 ENCOUNTER — Encounter: Payer: Medicare HMO | Admitting: Physical Therapy

## 2023-04-27 DIAGNOSIS — F3171 Bipolar disorder, in partial remission, most recent episode hypomanic: Secondary | ICD-10-CM | POA: Diagnosis not present

## 2023-04-28 ENCOUNTER — Encounter: Payer: Medicare HMO | Admitting: Physical Therapy

## 2023-04-29 DIAGNOSIS — H04123 Dry eye syndrome of bilateral lacrimal glands: Secondary | ICD-10-CM | POA: Diagnosis not present

## 2023-05-03 ENCOUNTER — Encounter: Payer: Self-pay | Admitting: Family Medicine

## 2023-05-03 ENCOUNTER — Encounter: Payer: Self-pay | Admitting: Physical Therapy

## 2023-05-03 ENCOUNTER — Ambulatory Visit: Payer: Medicare HMO | Admitting: Physical Therapy

## 2023-05-03 DIAGNOSIS — G8929 Other chronic pain: Secondary | ICD-10-CM

## 2023-05-03 DIAGNOSIS — R2689 Other abnormalities of gait and mobility: Secondary | ICD-10-CM | POA: Diagnosis not present

## 2023-05-03 DIAGNOSIS — M545 Low back pain, unspecified: Secondary | ICD-10-CM | POA: Diagnosis not present

## 2023-05-03 DIAGNOSIS — M5459 Other low back pain: Secondary | ICD-10-CM | POA: Diagnosis not present

## 2023-05-03 DIAGNOSIS — M25552 Pain in left hip: Secondary | ICD-10-CM | POA: Diagnosis not present

## 2023-05-03 DIAGNOSIS — M25551 Pain in right hip: Secondary | ICD-10-CM

## 2023-05-03 NOTE — Progress Notes (Signed)
Left hip MRI shows medium arthritis and extensive labral tearing.  This arthritis is more severe appearing on the MRI than it was in the x-ray and does explain your pain.  Unfortunately you are likely going to require hip replacement before too long.  Dr. Benjamin Stain did do a cortisone shot into the hip joint during the arthrogram component.  Did it help any?  If you do not get relief from a cortisone shot lasting longer than 3 months we should probably have a consultation with an orthopedic surgeon to discuss hip replacement.  MRI also showed some hamstring tendinitis which would be more noticeable at the posterior of the hip or the buttocks area.

## 2023-05-03 NOTE — Therapy (Signed)
OUTPATIENT PHYSICAL THERAPY TREATMENT/RECERTIFICATION   Patient Name: Whitney Lewis MRN: [id number] DOB:07/13/1960, 62 y.o., female Today's Date: 05/03/2023   PT End of Session - 05/03/23 0855     Visit Number 10    Number of Visits 20    Date for PT Re-Evaluation 06/14/23    Authorization Type [employer]    PT Start Time 0850    PT Stop Time 0928    PT Time Calculation (min) 38 min    Activity Tolerance Patient tolerated treatment well;No increased pain;Patient limited by pain    Behavior During Therapy [facility] for tasks assessed/performed                Past Medical History:  Diagnosis Date   ADD (attention deficit disorder)    Anxiety    Arthritis    feet, knees (07/04/2015)   Bipolar disorder (HCC)    Complication of anesthesia    hard to wake up, blood pressure drops    Depression    Family history of adverse reaction to anesthesia    "sister hard to wake up also"   Fibromyalgia dx'd ~ 2000   GERD (gastroesophageal reflux disease)    Hyperlipidemia    Hypertension    Migraine    "hormonal; stopped when I was 16" (07/04/2015)   OSA (obstructive sleep apnea)    wears mouth piece, no CPAP   Seizures (HCC)    Past Surgical History:  Procedure Laterality Date   ABDOMINAL HYSTERECTOMY  ~ 1993   BACK SURGERY     CESAREAN SECTION  1987   COLONOSCOPY     COLONOSCOPY WITH PROPOFOL N/A 09/21/2017   Procedure: COLONOSCOPY WITH PROPOFOL;  Surgeon: Christena Deem, MD;  Location: [facility] ENDOSCOPY;  Service: Endoscopy;  Laterality: N/A;   HAMMER TOE SURGERY Right 03/2015   HAMMER TOE SURGERY Left 06/06/2015   Procedure: LEFT TWO-THREE  HAMMER TOE CORRECTION ;  Surgeon: Toni Arthurs, MD;  Location: [facility];  Service: Orthopedics;  Laterality: Left;   INCISION AND DRAINAGE OF WOUND Right 07/04/2015   Procedure: IRRIGATION AND DEBRIDEMENT OF RIGHT FOOT DEEP ABSCESS; EXCISION OF RIGHT SECOND METATARSAL HEAD; REMOVAL OF DEEP IMPLANT RIGHT FOOT; EXCISION  OF RIGHT SECOND TOE PROXIMAL PHALANX BASE; INSERTION OF ANTIBIOTIC BEADS INTO RIGHT FOOT ABSCESS;  Surgeon: Toni Arthurs, MD;  Location: [facility] OR;  Service: Orthopedics;  Laterality: Right;  SITE ALSO  RIGHT FOOT   IRRIGATION AND DEBRIDEMENT FOOT Right 07/04/2015   Irrigation and excisional debridement of right foot deep abscess;; Insertion of antibiotic beads into the right foot abscess   JOINT REPLACEMENT     MAXIMUM ACCESS (MAS)POSTERIOR LUMBAR INTERBODY FUSION (PLIF) 1 LEVEL N/A 01/01/2017   Procedure: L4-5 MAXIMUM ACCESS (MAS) POSTERIOR LUMBAR INTERBODY FUSION (PLIF);  Surgeon: Tia Alert, MD;  Location: [facility] OR;  Service: Neurosurgery;  Laterality: N/A;   TOTAL KNEE ARTHROPLASTY Left    WEIL OSTEOTOMY Left 06/06/2015   Procedure: LEFT TWO AND THREE METATARSAL WEIL OSTEOTOMY ;  Surgeon: Toni Arthurs, MD;  Location: [facility];  Service: Orthopedics;  Laterality: Left;   Patient Active Problem List   Diagnosis Date Noted   Chronic left hip pain 04/13/2023   Metatarsal fracture 09/11/2021   S/P lumbar spinal fusion 01/01/2017   ADD (attention deficit disorder) 07/17/2015   Bipolar affective disorder (HCC) 07/17/2015   HLD (hyperlipidemia) 07/17/2015   BP (high blood pressure) 07/17/2015   Obstructive apnea 07/17/2015   Staphylococcus aureus infection  Foot osteomyelitis, right (HCC) 07/04/2015    PCP: Alysia Penna, MD  REFERRING PROVIDER: [name]  REFERRING DIAG: R hip pain, low back pain   THERAPY DIAG:  Pain in right hip  Other low back pain  Pain in left hip  Chronic right-sided low back pain without sciatica  Other abnormalities of gait and mobility  ONSET DATE:    SUBJECTIVE:                                                                                                                                                                                           SUBJECTIVE STATEMENT: 05/03/2023  Pt states she has had a worsening of her LBP and L hip  pain since 04/29/23 secondary to working over the weekend and prolonged sitting when driving a total of 6 hours. 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   03/01/2023:   Eval:   Pt states worsening discomfort in low back and R hip. And some pain in L hip. She feels very "off". She reports no pain in feet. She has been wearing Heel lift in R shoe since she was seen previously in PT. Now, without it, if she wears different shoes, she feels very uneven. Having a hard time finding shoes besides her sneakers that she can wear. She has been dong to [employer] with a trainer, and feels it has been helpful.  Pain: states Shooting pain into L thigh that makes leg buckle at times, intermittent. That is new. (Has had pain in L hip in the past)  Most pain is in R lateral and posterior hip,  and into bil low back/ bil glute region.   PERTINENT HISTORY:  L TKA,  Lumbar fusion L4/5 , previous foot and toe surgeries  PAIN:  Are you having pain? Yes NPRS scale:  3-4/10 Pain location: R Hip and low back  Pain orientation: Right  PAIN TYPE: sore, painful Pain description: intermittent  Aggravating factors:  standing, walking, activity Relieving factors: rest    PRECAUTIONS: None  WEIGHT BEARING RESTRICTIONS No  FALLS:  Has patient fallen in last 6 months? No, Number of falls: 0  OCCUPATION: Retired,   PLOF: Independent  PATIENT GOALS  decreased pain in hip, back, be able to do more activity without pain.  Feel more "even" with other shoes .    OBJECTIVE:   DIAGNOSTIC FINDINGS: waiting on read for back and R hip.    COGNITION:  Overall cognitive status: Within functional limits for tasks assessed   POSTURE:  Standing: L knee in slight flexion, Increased weight  bearing on R hip, slight fwd flexion of trunk.   Unable to achieve full upright trunk posture, some due to L hip stiffness.  Supine: R LE shorter,    PALPATION: R hip: tenderness in  posterior and lateral hip, bil glutes, R SI,  Significant limitation and hypomobility for L hip for ext, IR, flex, ER,  Mild limitation for L knee flex and ext (baseline from prev surgery)  Lumbar extension limited secondary to L hip extension limitation.    ROM AROM Eval-    Lumbar: extension limited, secondary to L hip extension limitation L knee: lacking full flexion and extension (from previous TKA) ; Limited DF bil;  L hip: significant limitation for ext>IR>flex>ER.   LOWER EXTREMITY ROM:     Active  Right 05/03/23 Left 05/03/23  Hip flexion WFL mod  Hip extension Min min  Hip abduction WFL min  Hip adduction    Hip internal rotation WFL Min  Hip external rotation [facility]  Knee flexion    Knee extension    Ankle dorsiflexion    Ankle plantarflexion    Ankle inversion    Ankle eversion     (Blank rows = not tested)    MMT Eval: Knees: 4+5,  Hips: 4-/5 on L,   4/5 on R  LOWER EXTREMITY MMT:    MMT Right 05/03/23 Left 05/03/23  Hip flexion 4+ 4+  Hip extension 4+ 4-  Hip abduction 4+ 4-  Hip adduction    Hip internal rotation    Hip external rotation    Knee flexion    Knee extension    Ankle dorsiflexion    Ankle plantarflexion    Ankle inversion    Ankle eversion     (Blank rows = not tested)    LUMBAR SPECIAL TESTS:  Neg SLR bil;    GAIT: Decreased hip ext on L with decreased terminal stance position   05/03/23- Min decreased hip ext on L during terminal stance, min decreased trunk rotation and min increased trunk forward flexion   TODAY'S TREATMENT   05/03/2023 Therapeutic Exercise: Aerobic: bike L2  x 5 min Supine:  Hip ER fallouts x 10, 5 sec;  Seated: 3-way ball rollouts 12x5" S/L:  Prone: Prone press up 12x5" for back and hip mobility  Standing:  Squats, 2x12 Stretches:  Recertification tests and measures Neuromuscular Re-education: Manual Therapy: Lumbar PAs, grades 3-4;  STM to lumbar PS  Therapeutic Activity: Self  Care:  Previous Therapeutic Exercise: Aerobic: bike L2  x 5 min Supine:  pelvic tilts x 15;  Hip ER fallouts x 10, 5 sec;  Seated:  S/L: Side planks bil 3x20"  Prone: Prone press up 12x5" for back and hip mobility  Standing:  Squats and press, 10# 2x15; Side-steps x4 laps RTB;  Stretches: Kneeling hip mobilization bil 15x5" Neuromuscular Re-education: Manual Therapy:     Therapeutic Activity: Self Care:   Therapeutic Exercise: Aerobic: bike L2  x 5 min Supine:  pelvic tilts x 15;  Hip ER fallouts x 10, 5 sec;  Seated:  S/L: Side planks bil 3x20"  Standing: Squats x12; Squats and press, 10# 2x15; Side-steps x4 laps RTB; Pallof Press 2x12 RTB; Step up with knee drive bil Z61, 09# Stretches:  Neuromuscular Re-education: Manual Therapy:     Therapeutic Activity: Self Care:  PATIENT EDUCATION:  Education details: updated and reviewed HEP  Person educated: Patient Education method: Explanation, Demonstration, Tactile cues, Verbal cues, and Handouts Education comprehension: verbalized understanding, returned demonstration, verbal  cues required, tactile cues required, and needs further education   HOME EXERCISE PROGRAM: Access Code: [email or web address](previous) Access Code: [email or web address]  new    ASSESSMENT:  CLINICAL IMPRESSION: 05/03/2023  Session focused on manual therapy and ther ex to promote pain control and increasing lumbar and hip mobility. Given pt's pain presentation due flare up some exercises were held. Pt displays improvements in both hip and lumbar mobility, particularly hip ER and abd, hip strength, particularly R hip abd, flexion, ext, and L hip flexion. Pt also displayed a reduction in overall pain and is steady progressing to meeting all goals. Pt continues to have limitations in Bil hip ext ROM, L hip flexion, IR, and abd ROM, gait, and global hip muscle performance. Pt would benefit from continued care to promote return of lumbar and hip mobility and bil global hip strength  to return to functional goals. Pt agrees with POC.   Eval:  Pt presents with primary complaint of pain in R hip and bil low back. She has inability for full upright posture, seemingly due to significant stiffness noted in L hip. She has significant limitation for L hip ROM, and is standing with slight flexion in L hip and knee, with increased weight bearing/shift onto R side. She has increased pain in R hip, bil glutes, and increased tightness in R lumbar region.  She has mild leg length discrepancy, with R being shorter than L, which does seems to be effecting her quite a bit. She has had multiple foot surgeries for toes, but is not having pain in feet at this time. We will discuss optimal footwear, and possible need for shoe lift for other/dress shoes besides sneakers so she can wear this more /full time. She has quite a bit of asymmetry effecting posture and pain. Will focus on L hip mobility, improving lumbar posture, for decreasing back pain and hip pain, as well as improving gait mechanics. Pt with decreased ability for full functional activities and will benefit from skilled PT to improve.    OBJECTIVE IMPAIRMENTS Abnormal gait, decreased activity tolerance, decreased balance, decreased knowledge of use of DME, decreased mobility, difficulty walking, decreased ROM, decreased strength, increased muscle spasms, impaired flexibility, improper body mechanics, and pain.   ACTIVITY LIMITATIONS meal prep, cleaning, laundry, shopping, community activity, and yard work.   PERSONAL FACTORS Past/current experiences /foot surgeries, L hip stiffness, leg length discrepancy, are effecting pts outcome.  REHAB POTENTIAL: Good  CLINICAL DECISION MAKING: Evolving/moderate complexity  EVALUATION COMPLEXITY: Moderate   GOALS: Goals reviewed with patient? Yes  SHORT TERM GOALS: Target date: 03/15/2023  Patient to be independent with initial HEP  Goal status: MET    LONG TERM GOALS: Target date:  06/14/2023   Patient to be independent with final HEP  Goal status: ONGOING  2.  Patient to demo improved ROM of L hip to be at max ability, to improve ability for more upright posture and gait.   Goal status: PROGRESSING  3.  Patient to demo improved strength of  bil hips to 4+5,  to improve stability and pain  Goal status: PROGRESSING   4.  Pt to obtain and be complaint with footwear/heel lift as appropriate for her leg length discrepancy, to allow for improved gait pattern.   Goal status: PROGRESSING   PLAN: PT FREQUENCY: 1-2x/week  PT DURATION: 6 weeks  PLANNED INTERVENTIONS: Therapeutic exercises, Therapeutic activity, Neuromuscular re-education, Balance training, Gait training, Patient/Family education, Self Care, Joint mobilization, Joint manipulation, Stair training, Orthotic/Fit training, DME instructions, Dry  Needling, Electrical stimulation, Spinal mobilization, Moist heat, Taping, Traction, Ultrasound, Ionotophoresis 4mg /ml Dexamethasone, Manual therapy, and Neuro Muscular re-education  PLAN FOR NEXT SESSION:  Pt POC extended for 6 additional weeks to promote improved hip/lumbar mobility and bil global hip strengthening.    [facility], SPT    This entire session was performed under direct supervision and direction of a licensed Estate agent. I have personally read, edited and approve of the note as written.  Sedalia Muta, PT, DPT 3:26 PM  05/03/23

## 2023-05-05 ENCOUNTER — Ambulatory Visit: Payer: Medicare HMO | Admitting: Physical Therapy

## 2023-05-05 ENCOUNTER — Encounter: Payer: Self-pay | Admitting: Physical Therapy

## 2023-05-05 DIAGNOSIS — R2689 Other abnormalities of gait and mobility: Secondary | ICD-10-CM

## 2023-05-05 DIAGNOSIS — M25552 Pain in left hip: Secondary | ICD-10-CM | POA: Diagnosis not present

## 2023-05-05 DIAGNOSIS — M5459 Other low back pain: Secondary | ICD-10-CM

## 2023-05-05 DIAGNOSIS — M25551 Pain in right hip: Secondary | ICD-10-CM | POA: Diagnosis not present

## 2023-05-05 DIAGNOSIS — M545 Low back pain, unspecified: Secondary | ICD-10-CM

## 2023-05-05 DIAGNOSIS — G8929 Other chronic pain: Secondary | ICD-10-CM | POA: Diagnosis not present

## 2023-05-05 NOTE — Therapy (Signed)
OUTPATIENT PHYSICAL THERAPY TREATMENT   Patient Name: Whitney Lewis MRN: [id number] DOB:01-27-1961, 62 y.o., female Today's Date: 05/05/2023   PT End of Session - 05/05/23 0802     Visit Number 11    Number of Visits 20    Date for PT Re-Evaluation 06/14/23    Authorization Type [employer]    PT Start Time 0804    PT Stop Time 0842    PT Time Calculation (min) 38 min    Activity Tolerance Patient tolerated treatment well;No increased pain;Patient limited by pain    Behavior During Therapy [facility] for tasks assessed/performed                 Past Medical History:  Diagnosis Date   ADD (attention deficit disorder)    Anxiety    Arthritis    feet, knees (07/04/2015)   Bipolar disorder (HCC)    Complication of anesthesia    hard to wake up, blood pressure drops    Depression    Family history of adverse reaction to anesthesia    "sister hard to wake up also"   Fibromyalgia dx'd ~ 2000   GERD (gastroesophageal reflux disease)    Hyperlipidemia    Hypertension    Migraine    "hormonal; stopped when I was 16" (07/04/2015)   OSA (obstructive sleep apnea)    wears mouth piece, no CPAP   Seizures (HCC)    Past Surgical History:  Procedure Laterality Date   ABDOMINAL HYSTERECTOMY  ~ 1993   BACK SURGERY     CESAREAN SECTION  1987   COLONOSCOPY     COLONOSCOPY WITH PROPOFOL N/A 09/21/2017   Procedure: COLONOSCOPY WITH PROPOFOL;  Surgeon: Christena Deem, MD;  Location: [facility] ENDOSCOPY;  Service: Endoscopy;  Laterality: N/A;   HAMMER TOE SURGERY Right 03/2015   HAMMER TOE SURGERY Left 06/06/2015   Procedure: LEFT TWO-THREE  HAMMER TOE CORRECTION ;  Surgeon: Toni Arthurs, MD;  Location: [facility];  Service: Orthopedics;  Laterality: Left;   INCISION AND DRAINAGE OF WOUND Right 07/04/2015   Procedure: IRRIGATION AND DEBRIDEMENT OF RIGHT FOOT DEEP ABSCESS; EXCISION OF RIGHT SECOND METATARSAL HEAD; REMOVAL OF DEEP IMPLANT RIGHT FOOT; EXCISION OF RIGHT  SECOND TOE PROXIMAL PHALANX BASE; INSERTION OF ANTIBIOTIC BEADS INTO RIGHT FOOT ABSCESS;  Surgeon: Toni Arthurs, MD;  Location: [facility] OR;  Service: Orthopedics;  Laterality: Right;  SITE ALSO  RIGHT FOOT   IRRIGATION AND DEBRIDEMENT FOOT Right 07/04/2015   Irrigation and excisional debridement of right foot deep abscess;; Insertion of antibiotic beads into the right foot abscess   JOINT REPLACEMENT     MAXIMUM ACCESS (MAS)POSTERIOR LUMBAR INTERBODY FUSION (PLIF) 1 LEVEL N/A 01/01/2017   Procedure: L4-5 MAXIMUM ACCESS (MAS) POSTERIOR LUMBAR INTERBODY FUSION (PLIF);  Surgeon: Tia Alert, MD;  Location: [facility] OR;  Service: Neurosurgery;  Laterality: N/A;   TOTAL KNEE ARTHROPLASTY Left    WEIL OSTEOTOMY Left 06/06/2015   Procedure: LEFT TWO AND THREE METATARSAL WEIL OSTEOTOMY ;  Surgeon: Toni Arthurs, MD;  Location: [facility];  Service: Orthopedics;  Laterality: Left;   Patient Active Problem List   Diagnosis Date Noted   Chronic left hip pain 04/13/2023   Metatarsal fracture 09/11/2021   S/P lumbar spinal fusion 01/01/2017   ADD (attention deficit disorder) 07/17/2015   Bipolar affective disorder (HCC) 07/17/2015   HLD (hyperlipidemia) 07/17/2015   BP (high blood pressure) 07/17/2015   Obstructive apnea 07/17/2015   Staphylococcus aureus infection  Foot osteomyelitis, right (HCC) 07/04/2015    PCP: Alysia Penna, MD  REFERRING PROVIDER: [name]  REFERRING DIAG: R hip pain, low back pain   THERAPY DIAG:  Pain in right hip  Other low back pain  Pain in left hip  Chronic right-sided low back pain without sciatica  Other abnormalities of gait and mobility  ONSET DATE:    SUBJECTIVE:                                                                                                                                                                                           SUBJECTIVE STATEMENT: 05/05/2023  Pt states she her recent flare up has gotten better since her  last session.     03/01/2023:   Eval:   Pt states worsening discomfort in low back and R hip. And some pain in L hip. She feels very "off". She reports no pain in feet. She has been wearing Heel lift in R shoe since she was seen previously in PT. Now, without it, if she wears different shoes, she feels very uneven. Having a hard time finding shoes besides her sneakers that she can wear. She has been dong to [employer] with a trainer, and feels it has been helpful.  Pain: states Shooting pain into L thigh that makes leg buckle at times, intermittent. That is new. (Has had pain in L hip in the past)  Most pain is in R lateral and posterior hip,  and into bil low back/ bil glute region.   PERTINENT HISTORY:  L TKA,  Lumbar fusion L4/5 , previous foot and toe surgeries  PAIN:  Are you having pain? Yes NPRS scale:  3-4/10 Pain location: R Hip and low back  Pain orientation: Right  PAIN TYPE: sore, painful Pain description: intermittent  Aggravating factors:  standing, walking, activity Relieving factors: rest    PRECAUTIONS: None  WEIGHT BEARING RESTRICTIONS No  FALLS:  Has patient fallen in last 6 months? No, Number of falls: 0  OCCUPATION: Retired,   PLOF: Independent  PATIENT GOALS  decreased pain in hip, back, be able to do more activity without pain.  Feel more "even" with other shoes .    OBJECTIVE:   DIAGNOSTIC FINDINGS: waiting on read for back and R hip.    COGNITION:  Overall cognitive status: Within functional limits for tasks assessed   POSTURE:  Standing: L knee in slight flexion, Increased weight bearing on R hip, slight fwd flexion of trunk.   Unable to achieve full upright trunk posture, some due to L hip stiffness.  Supine: R LE shorter,  PALPATION: R hip: tenderness in posterior and lateral hip, bil glutes, R SI,  Significant limitation and hypomobility for L hip for ext, IR, flex, ER,  Mild limitation for L knee flex and ext (baseline from prev  surgery)  Lumbar extension limited secondary to L hip extension limitation.    ROM AROM Eval-    Lumbar: extension limited, secondary to L hip extension limitation L knee: lacking full flexion and extension (from previous TKA) ; Limited DF bil;  L hip: significant limitation for ext>IR>flex>ER.   LOWER EXTREMITY ROM:     Active  Right 05/03/23 Left 05/03/23  Hip flexion WFL mod  Hip extension Min min  Hip abduction WFL min  Hip adduction    Hip internal rotation WFL Min  Hip external rotation [facility]  Knee flexion    Knee extension    Ankle dorsiflexion    Ankle plantarflexion    Ankle inversion    Ankle eversion     (Blank rows = not tested)    MMT Eval: Knees: 4+5,  Hips: 4-/5 on L,   4/5 on R  LOWER EXTREMITY MMT:    MMT Right 05/03/23 Left 05/03/23  Hip flexion 4+ 4+  Hip extension 4+ 4-  Hip abduction 4+ 4-  Hip adduction    Hip internal rotation    Hip external rotation    Knee flexion    Knee extension    Ankle dorsiflexion    Ankle plantarflexion    Ankle inversion    Ankle eversion     (Blank rows = not tested)    LUMBAR SPECIAL TESTS:  Neg SLR bil;    GAIT: Decreased hip ext on L with decreased terminal stance position   05/03/23- Min decreased hip ext on L during terminal stance, min decreased trunk rotation and min increased trunk forward flexion   TODAY'S TREATMENT   05/05/2023 Therapeutic Exercise: Aerobic: bike L2  x 5 min Supine:   Seated:  S/L: Side planks bil 3x20"  Prone: Prone press up 12x5" for back and hip mobility  Standing:  Side steps x4 laps RTB; Paloff press, bil x12; Squat and press, 10# 2x10; Walking marches, 8# x4 laps of 15 ft; RTB Stretches:  Recertification tests and measures Neuromuscular Re-education:  Manual Therapy: Dry needling to bil glutes Therapeutic Activity:  Self Care:  Previous Therapeutic Exercise: Aerobic: bike L2  x 5 min Supine:  Hip ER fallouts x 10, 5 sec;  Seated: 3-way ball rollouts  12x5" S/L:  Prone: Prone press up 12x5" for back and hip mobility  Standing:  Squats, 2x12 Stretches:  Recertification tests and measures Neuromuscular Re-education: Manual Therapy: Lumbar PAs, grades 3-4;  STM to lumbar PS  Therapeutic Activity: Self Care:   Therapeutic Exercise: Aerobic: bike L2  x 5 min Supine:  pelvic tilts x 15;  Hip ER fallouts x 10, 5 sec;  Seated:  S/L: Side planks bil 3x20"  Prone: Prone press up 12x5" for back and hip mobility  Standing:  Squats and press, 10# 2x15; Side-steps x4 laps RTB;  Stretches: Kneeling hip mobilization bil 15x5" Neuromuscular Re-education: Manual Therapy:     Therapeutic Activity: Self Care:   PATIENT EDUCATION:  Education details: updated and reviewed HEP  Person educated: Patient Education method: Explanation, Demonstration, Tactile cues, Verbal cues, and Handouts Education comprehension: verbalized understanding, returned demonstration, verbal cues required, tactile cues required, and needs further education   HOME EXERCISE PROGRAM: Access Code: [email or web address](previous) Access Code: [email or web address]  new  ASSESSMENT:  CLINICAL IMPRESSION: 05/05/2023  Session focused on manual therapy for pain control and reducing muscle tension and ther ex to promote global LE strengthening. Pt's L LE ROM and LE strength has improved however her LE ROM is limited secondary to capsular and muscular tightness. Continue to promote active lumbar and hip mobility and global hip strengthening as tolerated. Pt able to perform all exercises without increased pain or modification.      Eval:  Pt presents with primary complaint of pain in R hip and bil low back. She has inability for full upright posture, seemingly due to significant stiffness noted in L hip. She has significant limitation for L hip ROM, and is standing with slight flexion in L hip and knee, with increased weight bearing/shift onto R side. She has increased pain in R hip, bil glutes,  and increased tightness in R lumbar region.  She has mild leg length discrepancy, with R being shorter than L, which does seems to be effecting her quite a bit. She has had multiple foot surgeries for toes, but is not having pain in feet at this time. We will discuss optimal footwear, and possible need for shoe lift for other/dress shoes besides sneakers so she can wear this more /full time. She has quite a bit of asymmetry effecting posture and pain. Will focus on L hip mobility, improving lumbar posture, for decreasing back pain and hip pain, as well as improving gait mechanics. Pt with decreased ability for full functional activities and will benefit from skilled PT to improve.    OBJECTIVE IMPAIRMENTS Abnormal gait, decreased activity tolerance, decreased balance, decreased knowledge of use of DME, decreased mobility, difficulty walking, decreased ROM, decreased strength, increased muscle spasms, impaired flexibility, improper body mechanics, and pain.   ACTIVITY LIMITATIONS meal prep, cleaning, laundry, shopping, community activity, and yard work.   PERSONAL FACTORS Past/current experiences /foot surgeries, L hip stiffness, leg length discrepancy, are effecting pts outcome.  REHAB POTENTIAL: Good  CLINICAL DECISION MAKING: Evolving/moderate complexity  EVALUATION COMPLEXITY: Moderate   GOALS: Goals reviewed with patient? Yes  SHORT TERM GOALS: Target date: 03/15/2023  Patient to be independent with initial HEP  Goal status: MET    LONG TERM GOALS: Target date: 06/14/2023   Patient to be independent with final HEP  Goal status: ONGOING  2.  Patient to demo improved ROM of L hip to be at max ability, to improve ability for more upright posture and gait.   Goal status: PROGRESSING  3.  Patient to demo improved strength of  bil hips to 4+5,  to improve stability and pain  Goal status: PROGRESSING   4.  Pt to obtain and be complaint with footwear/heel lift as appropriate  for her leg length discrepancy, to allow for improved gait pattern.   Goal status: PROGRESSING   PLAN: PT FREQUENCY: 1-2x/week  PT DURATION: 6 weeks  PLANNED INTERVENTIONS: Therapeutic exercises, Therapeutic activity, Neuromuscular re-education, Balance training, Gait training, Patient/Family education, Self Care, Joint mobilization, Joint manipulation, Stair training, Orthotic/Fit training, DME instructions, Dry Needling, Electrical stimulation, Spinal mobilization, Moist heat, Taping, Traction, Ultrasound, Ionotophoresis 4mg /ml Dexamethasone, Manual therapy, and Neuro Muscular re-education  PLAN FOR NEXT SESSION:  Pt POC extended for 6 additional weeks to promote improved hip/lumbar mobility and bil global hip strengthening.    [facility], SPT    This entire session was performed under direct supervision and direction of a licensed Estate agent. I have personally read, edited and approve of the note as written.  Sedalia Muta, PT, DPT 1:15 PM  05/05/23

## 2023-05-06 ENCOUNTER — Ambulatory Visit: Payer: Medicare HMO | Attending: Internal Medicine

## 2023-05-06 ENCOUNTER — Other Ambulatory Visit: Payer: Self-pay

## 2023-05-06 DIAGNOSIS — M25552 Pain in left hip: Secondary | ICD-10-CM

## 2023-05-06 DIAGNOSIS — G453 Amaurosis fugax: Secondary | ICD-10-CM

## 2023-05-06 DIAGNOSIS — H348122 Central retinal vein occlusion, left eye, stable: Secondary | ICD-10-CM

## 2023-05-13 ENCOUNTER — Encounter: Payer: Medicare HMO | Admitting: Physical Therapy

## 2023-05-13 DIAGNOSIS — H34812 Central retinal vein occlusion, left eye, with macular edema: Secondary | ICD-10-CM | POA: Diagnosis not present

## 2023-05-13 DIAGNOSIS — F3171 Bipolar disorder, in partial remission, most recent episode hypomanic: Secondary | ICD-10-CM | POA: Diagnosis not present

## 2023-05-14 ENCOUNTER — Ambulatory Visit (HOSPITAL_COMMUNITY): Payer: Medicare HMO | Attending: Internal Medicine

## 2023-05-14 DIAGNOSIS — G453 Amaurosis fugax: Secondary | ICD-10-CM | POA: Diagnosis not present

## 2023-05-14 DIAGNOSIS — G4730 Sleep apnea, unspecified: Secondary | ICD-10-CM | POA: Insufficient documentation

## 2023-05-14 DIAGNOSIS — H348122 Central retinal vein occlusion, left eye, stable: Secondary | ICD-10-CM | POA: Insufficient documentation

## 2023-05-14 DIAGNOSIS — I10 Essential (primary) hypertension: Secondary | ICD-10-CM | POA: Diagnosis not present

## 2023-05-14 DIAGNOSIS — E785 Hyperlipidemia, unspecified: Secondary | ICD-10-CM | POA: Insufficient documentation

## 2023-05-14 LAB — ECHOCARDIOGRAM COMPLETE
Calc EF: 56.1 %
S' Lateral: 2.04 cm
Single Plane A2C EF: 53 %
Single Plane A4C EF: 56.5 %

## 2023-05-17 ENCOUNTER — Encounter: Payer: Medicare HMO | Admitting: Physical Therapy

## 2023-05-18 IMAGING — DX DG FOOT COMPLETE 3+V*L*
3 series · 3 of 3 positions shown · non-contrast
Comparison: Left foot radiograph 6 weeks ago 09/11/2021

CLINICAL DATA: Left foot pain. Patient reports fifth metatarsal
pain for 6 days.

EXAM:
LEFT FOOT - COMPLETE 3+ VIEW

[foot ap]
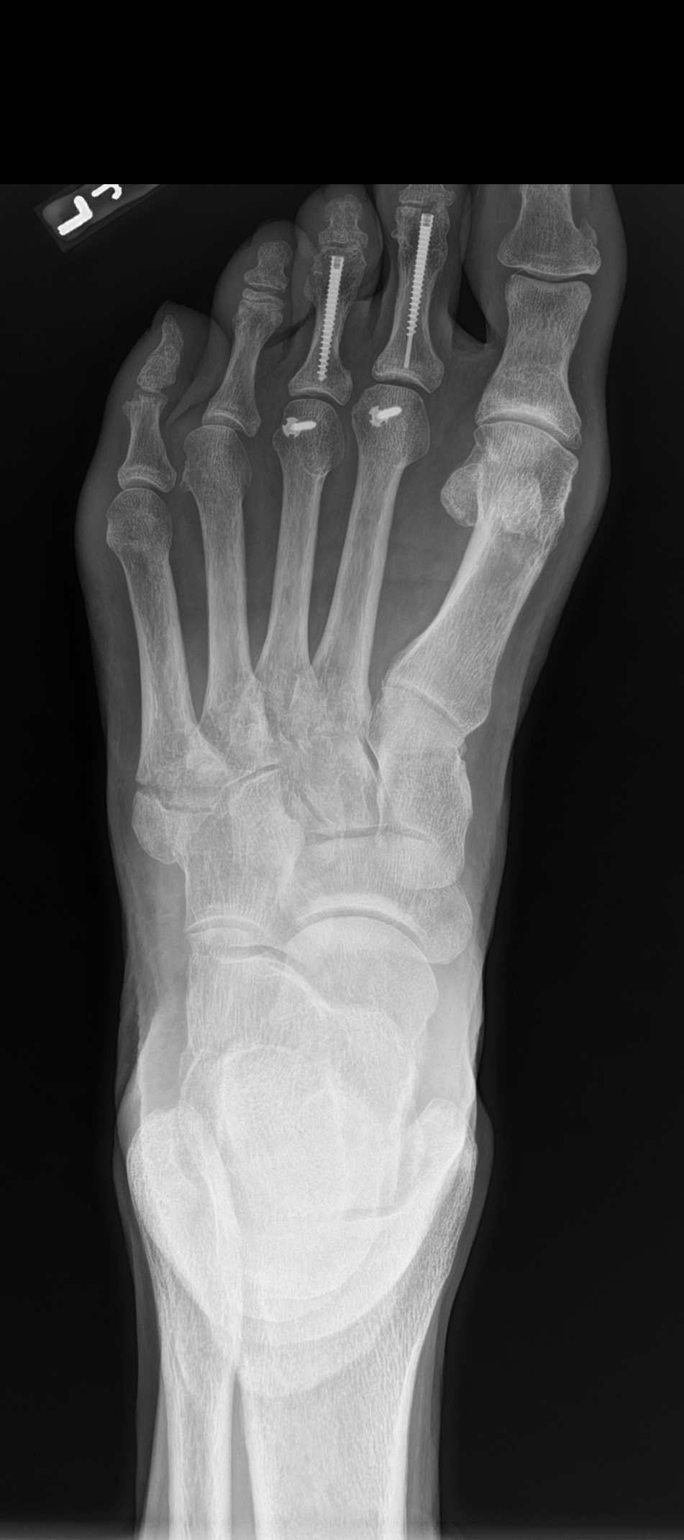

[foot obl]
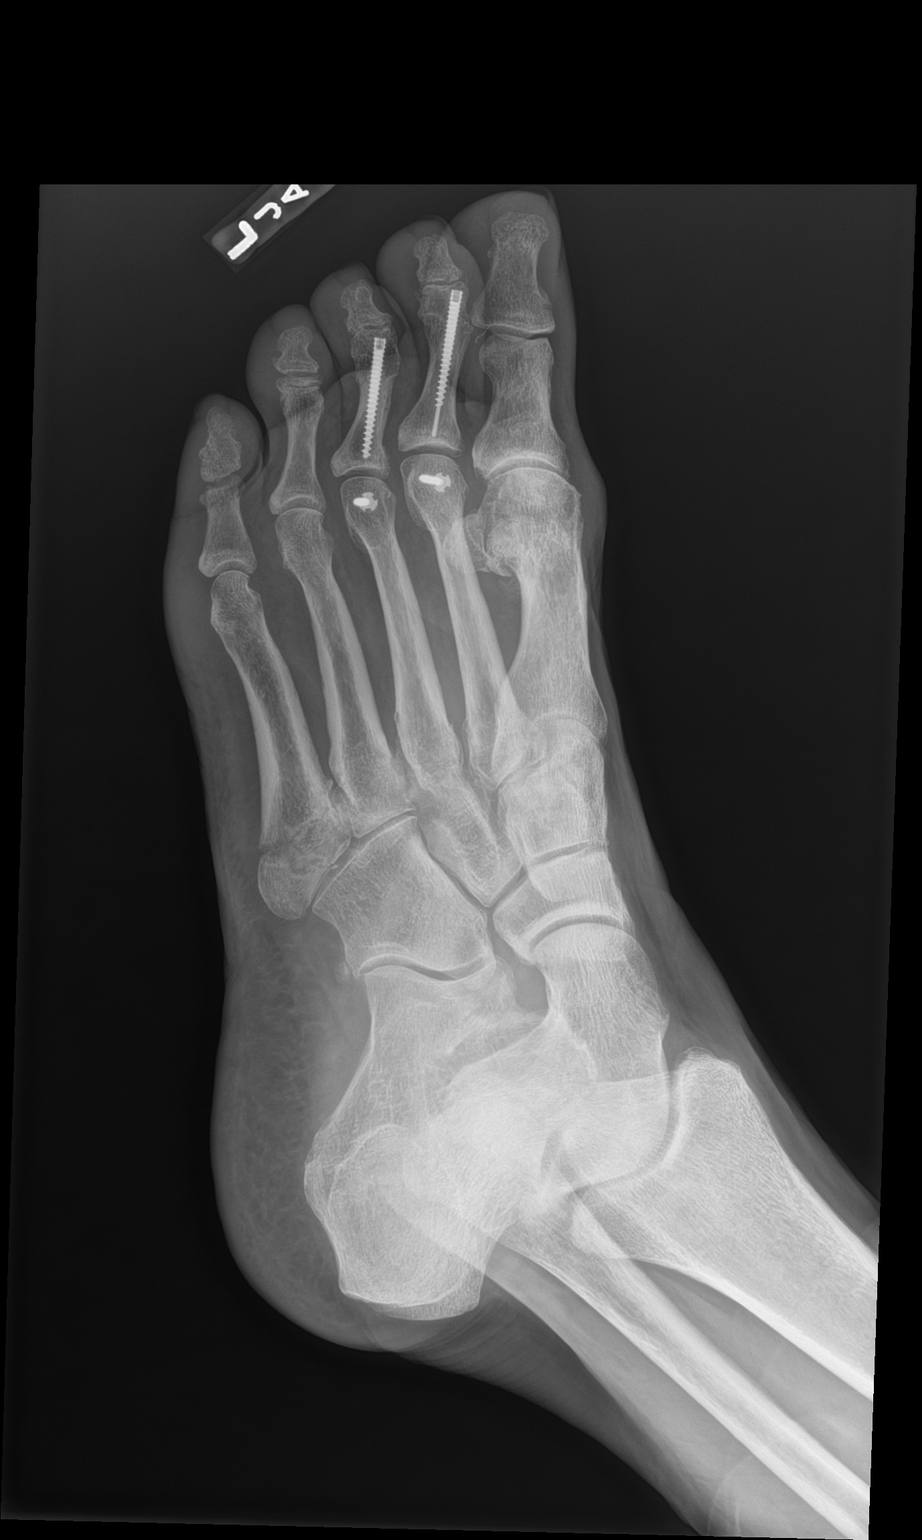

[foot lat]
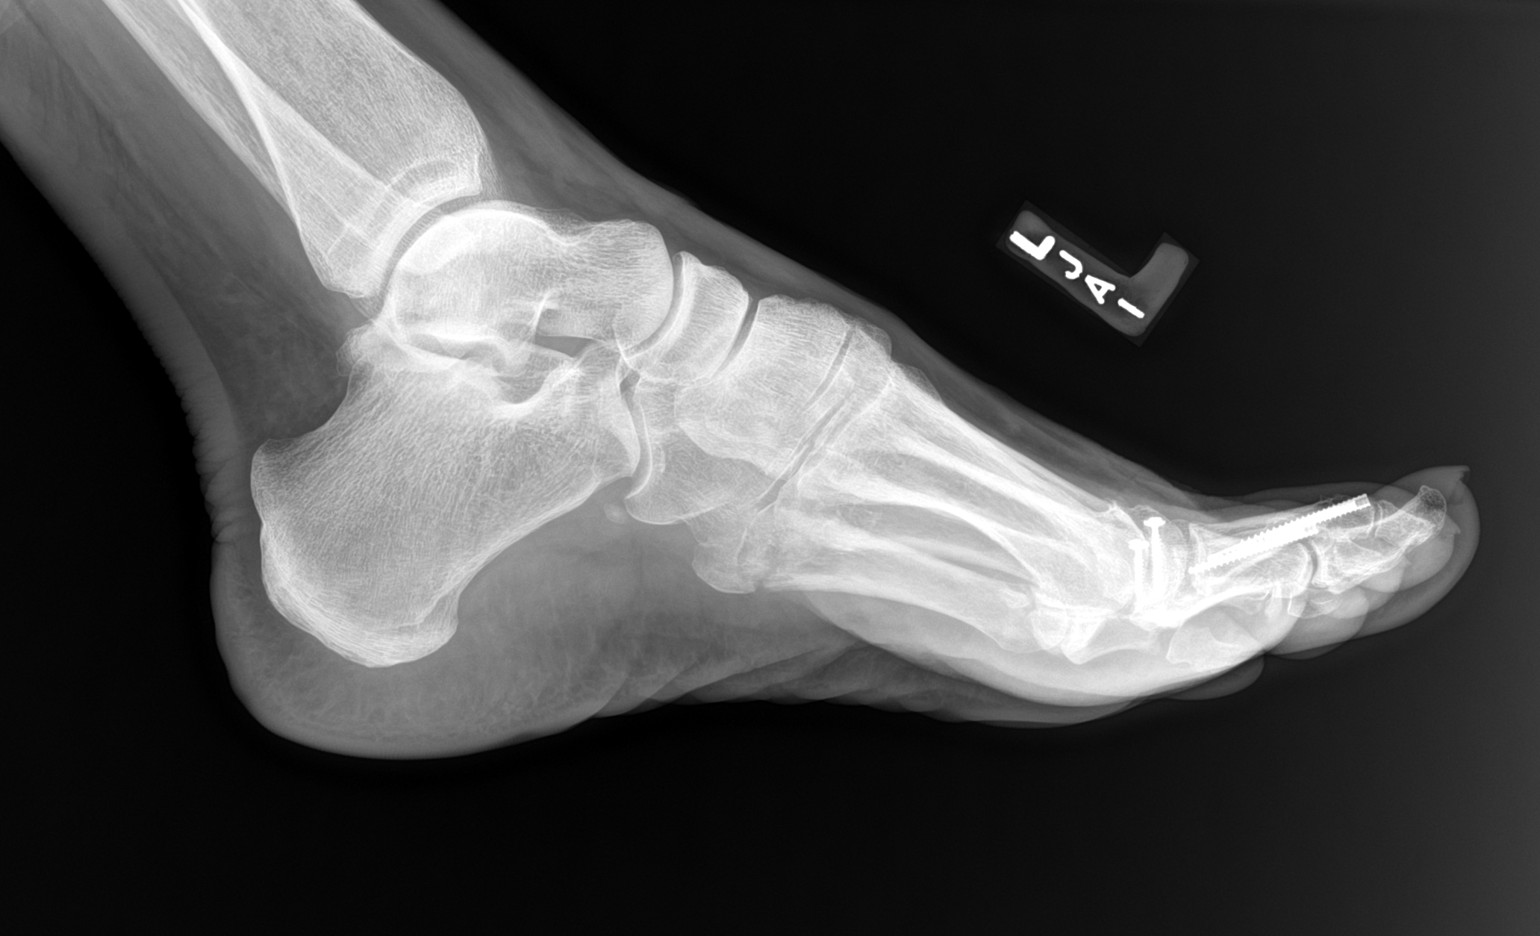

[3 of 3 positions shown; findings below may reference images not displayed]

FINDINGS: The proximal fifth metatarsal fracture line remains visible. There
may be some incomplete bony bridging about the medial aspect of the
fracture, however the majority of the fracture line is distinct.
Fracture extension to the metatarsal cuboid articulation is again
seen. There is no peripheral callus formation. Overall alignment is
unchanged.

Stable fusion of the second and third middle and proximal phalanges,
as well as screws in the second and third metatarsal heads. Question
postsurgical change of the fourth and fifth digits. Mild
osteoarthritis of the first metatarsal phalangeal joint. No new or
acute fracture. Suggestion of Constantin Cristian Verity on these nonweightbearing
views. No focal soft tissue abnormalities.
IMPRESSION: 1. Unchanged alignment of proximal fifth metatarsal fracture. There
may be some incomplete bony bridging about the medial aspect of the
fracture, however the majority of the fracture line is distinct. No
peripheral callus formation.
2. No other interval change.

## 2023-05-24 ENCOUNTER — Ambulatory Visit: Payer: Medicare HMO | Admitting: Physical Therapy

## 2023-05-24 ENCOUNTER — Encounter: Payer: Self-pay | Admitting: Physical Therapy

## 2023-05-24 DIAGNOSIS — M5459 Other low back pain: Secondary | ICD-10-CM

## 2023-05-24 DIAGNOSIS — M25552 Pain in left hip: Secondary | ICD-10-CM | POA: Diagnosis not present

## 2023-05-24 DIAGNOSIS — M25551 Pain in right hip: Secondary | ICD-10-CM

## 2023-05-24 NOTE — Therapy (Addendum)
 OUTPATIENT PHYSICAL THERAPY TREATMENT   Patient Name: Whitney Lewis MRN: [id number] DOB:[birth year], 62 y.o., female Today's Date: 05/24/2023   PT End of Session - 05/24/23 0851     Visit Number 12    Number of Visits 20    Date for PT Re-Evaluation 06/14/23    Authorization Type [employer]    PT Start Time [phone]    PT Stop Time 0930    PT Time Calculation (min) 40 min    Activity Tolerance Patient tolerated treatment well;No increased pain;Patient limited by pain    Behavior During Therapy [facility] for tasks assessed/performed                 Past Medical History:  Diagnosis Date   ADD (attention deficit disorder)    Anxiety    Arthritis    feet, knees (07/04/2015)   Bipolar disorder (HCC)    Complication of anesthesia    hard to wake up, blood pressure drops    Depression    Family history of adverse reaction to anesthesia    "sister hard to wake up also"   Fibromyalgia dx'd ~ 2000   GERD (gastroesophageal reflux disease)    Hyperlipidemia    Hypertension    Migraine    "hormonal; stopped when I was 16" (07/04/2015)   OSA (obstructive sleep apnea)    wears mouth piece, no CPAP   Seizures (HCC)    Past Surgical History:  Procedure Laterality Date   ABDOMINAL HYSTERECTOMY  ~ 1993   BACK SURGERY     CESAREAN SECTION  1987   COLONOSCOPY     COLONOSCOPY WITH PROPOFOL  N/A 09/21/2017   Procedure: COLONOSCOPY WITH PROPOFOL ;  Surgeon: Deveron Fly, MD;  Location: [facility] ENDOSCOPY;  Service: Endoscopy;  Laterality: N/A;   HAMMER TOE SURGERY Right 03/2015   HAMMER TOE SURGERY Left 06/06/2015   Procedure: LEFT TWO-THREE  HAMMER TOE CORRECTION ;  Surgeon: Amada Backer, MD;  Location: [facility];  Service: Orthopedics;  Laterality: Left;   INCISION AND DRAINAGE OF WOUND Right 07/04/2015   Procedure: IRRIGATION AND DEBRIDEMENT OF RIGHT FOOT DEEP ABSCESS; EXCISION OF RIGHT SECOND METATARSAL HEAD; REMOVAL OF DEEP IMPLANT RIGHT FOOT; EXCISION OF RIGHT  SECOND TOE PROXIMAL PHALANX BASE; INSERTION OF ANTIBIOTIC BEADS INTO RIGHT FOOT ABSCESS;  Surgeon: Amada Backer, MD;  Location: [facility] OR;  Service: Orthopedics;  Laterality: Right;  SITE ALSO  RIGHT FOOT   IRRIGATION AND DEBRIDEMENT FOOT Right 07/04/2015   Irrigation and excisional debridement of right foot deep abscess;; Insertion of antibiotic beads into the right foot abscess   JOINT REPLACEMENT     MAXIMUM ACCESS (MAS)POSTERIOR LUMBAR INTERBODY FUSION (PLIF) 1 LEVEL N/A 01/01/2017   Procedure: L4-5 MAXIMUM ACCESS (MAS) POSTERIOR LUMBAR INTERBODY FUSION (PLIF);  Surgeon: Isadora Mar, MD;  Location[facility] OR;  Service: Neurosurgery;  Laterality: N/A;   TOTAL KNEE ARTHROPLASTY Left    WEIL OSTEOTOMY Left 06/06/2015   Procedure: LEFT TWO AND THREE METATARSAL WEIL OSTEOTOMY ;  Surgeon: Amada Backer, MD;  Location: [facility];  Service: Orthopedics;  Laterality: Left;   Patient Active Problem List   Diagnosis Date Noted   Chronic left hip pain 04/13/2023   Metatarsal fracture 09/11/2021   S/P lumbar spinal fusion 01/01/2017   ADD (attention deficit disorder) 07/17/2015   Bipolar affective disorder (HCC) 07/17/2015   HLD (hyperlipidemia) 07/17/2015   BP (high blood pressure) 07/17/2015   Obstructive apnea 07/17/2015   Staphylococcus aureus infection  Foot osteomyelitis, right (HCC) 07/04/2015    PCP: Barnetta Liberty, MD  REFERRING PROVIDER: Garlan Juniper  REFERRING DIAG: R hip pain, low back pain   THERAPY DIAG:  Pain in right hip  Other low back pain  Pain in left hip  ONSET DATE:    SUBJECTIVE:                                                                                                                                                                                           SUBJECTIVE STATEMENT: 05/24/2023  1/8 seeing ortho surgeon for hip replacement consult. Pain on R has not been too bad, but continues to have pain in back from being stooped over, and also has  some newer pain around L shoulder blade.     03/01/2023:   Eval:   Pt states worsening discomfort in low back and R hip. And some pain in L hip. She feels very "off". She reports no pain in feet. She has been wearing Heel lift in R shoe since she was seen previously in PT. Now, without it, if she wears different shoes, she feels very uneven. Having a hard time finding shoes besides her sneakers that she can wear. She has been dong to [employer] with a trainer, and feels it has been helpful.  Pain: states Shooting pain into L thigh that makes leg buckle at times, intermittent. That is new. (Has had pain in L hip in the past)  Most pain is in R lateral and posterior hip,  and into bil low back/ bil glute region.   PERTINENT HISTORY:  L TKA,  Lumbar fusion L4/5 , previous foot and toe surgeries  PAIN:  Are you having pain? Yes NPRS scale:  3-4/10 Pain location: R Hip and low back , L hip Pain orientation: Right , LEft PAIN TYPE: sore, painful Pain description: intermittent  Aggravating factors:  standing, walking, activity Relieving factors: rest    PRECAUTIONS: None  WEIGHT BEARING RESTRICTIONS No  FALLS:  Has patient fallen in last 6 months? No, Number of falls: 0  OCCUPATION: Retired,   PLOF: Independent  PATIENT GOALS  decreased pain in hip, back, be able to do more activity without pain.  Feel more "even" with other shoes .    OBJECTIVE:   DIAGNOSTIC FINDINGS: waiting on read for back and R hip.    COGNITION:  Overall cognitive status: Within functional limits for tasks assessed   POSTURE:  Standing: L knee in slight flexion, Increased weight bearing on R hip, slight fwd flexion of trunk.   Unable to achieve full upright trunk posture, some due to  L hip stiffness.  Supine: R LE shorter,    ROM AROM Eval-    Lumbar: extension limited, secondary to L hip extension limitation L knee: lacking full flexion and extension (from previous TKA) ; Limited DF bil;  L hip:  significant limitation for ext>IR>flex>ER.   LOWER EXTREMITY ROM:     Active  Right 05/24/23 Left 05/24/23  Hip flexion WFL mod  Hip extension Min min  Hip abduction WFL min  Hip adduction    Hip internal rotation WFL Min  Hip external rotation [facility]  Knee flexion    Knee extension    Ankle dorsiflexion    Ankle plantarflexion    Ankle inversion    Ankle eversion     (Blank rows = not tested)    MMT Eval: Knees: 4+5,  Hips: 4/5 on L,   4/+5 on R  LOWER EXTREMITY MMT:      LUMBAR SPECIAL TESTS:  Neg SLR bil;    GAIT: Decreased hip ext on L with decreased terminal stance position     TODAY'S TREATMENT   05/24/2023 Therapeutic Exercise: Aerobic:  Supine:   SLR2 x 10 with TA; practice for TA x 5; bridging 2 x 5;  Seated:  S/L: hip abd x 12  Prone:  Standing:   Stretches: quadruped thread the needle 2 x 5 bil;  Review for seated UT and levator stretches on L;  Supine SA reaches 2 x 10;  Neuromuscular Re-education:  Manual Therapy: Therapeutic Activity:  Self Care: education on HEP to continue from now until possible hip surgery, Best ther ex to continue for HEP and at gym, expectation for post op healing time,   Previous Therapeutic Exercise: Aerobic: bike L2  x 5 min Supine:  Hip ER fallouts x 10, 5 sec;  Seated: 3-way ball rollouts 12x5" S/L:  Prone: Prone press up 12x5" for back and hip mobility  Standing:  Squats, 2x12 Stretches:  Recertification tests and measures Neuromuscular Re-education: Manual Therapy: Lumbar PAs, grades 3-4;  STM to lumbar PS  Therapeutic Activity: Self Care:   Therapeutic Exercise: Aerobic: bike L2  x 5 min Supine:  pelvic tilts x 15;  Hip ER fallouts x 10, 5 sec;  Seated:  S/L: Side planks bil 3x20"  Prone: Prone press up 12x5" for back and hip mobility  Standing:  Squats and press, 10# 2x15; Side-steps x4 laps RTB;  Stretches: Kneeling hip mobilization bil 15x5" Neuromuscular Re-education: Manual Therapy:      Therapeutic Activity: Self Care:   PATIENT EDUCATION:  Education details: updated and reviewed HEP  Person educated: Patient Education method: Explanation, Demonstration, Tactile cues, Verbal cues, and Handouts Education comprehension: verbalized understanding, returned demonstration, verbal cues required, tactile cues required, and needs further education   HOME EXERCISE PROGRAM: Access Code: [email or web address](previous) Access Code: [email or web address]  new    ASSESSMENT:  CLINICAL IMPRESSION: 05/24/2023  Pt doing well managing symptoms at this time. She does have continued pain , ROM and strength deficits from L hip. She will be seeing ortho MD for surgical consult on 1/8. Discussed what exercises to continue at this time. Updated HEP today. Plan to hold at this time, pt may return after MD visit or after surgery if she ends up having THA.    Eval:  Pt presents with primary complaint of pain in R hip and bil low back. She has inability for full upright posture, seemingly due to significant stiffness noted in L hip. She has significant limitation for L hip  ROM, and is standing with slight flexion in L hip and knee, with increased weight bearing/shift onto R side. She has increased pain in R hip, bil glutes, and increased tightness in R lumbar region.  She has mild leg length discrepancy, with R being shorter than L, which does seems to be effecting her quite a bit. She has had multiple foot surgeries for toes, but is not having pain in feet at this time. We will discuss optimal footwear, and possible need for shoe lift for other/dress shoes besides sneakers so she can wear this more /full time. She has quite a bit of asymmetry effecting posture and pain. Will focus on L hip mobility, improving lumbar posture, for decreasing back pain and hip pain, as well as improving gait mechanics. Pt with decreased ability for full functional activities and will benefit from skilled PT to improve.    OBJECTIVE  IMPAIRMENTS Abnormal gait, decreased activity tolerance, decreased balance, decreased knowledge of use of DME, decreased mobility, difficulty walking, decreased ROM, decreased strength, increased muscle spasms, impaired flexibility, improper body mechanics, and pain.   ACTIVITY LIMITATIONS meal prep, cleaning, laundry, shopping, community activity, and yard work.   PERSONAL FACTORS Past/current experiences /foot surgeries, L hip stiffness, leg length discrepancy, are effecting pts outcome.  REHAB POTENTIAL: Good  CLINICAL DECISION MAKING: Evolving/moderate complexity  EVALUATION COMPLEXITY: Moderate    GOALS: Goals reviewed with patient? Yes  SHORT TERM GOALS: Target date: 03/15/2023  Patient to be independent with initial HEP  Goal status: MET    LONG TERM GOALS: Target date: 06/14/2023   Patient to be independent with final HEP  Goal status: MET  2.  Patient to demo improved ROM of L hip to be at max ability, to improve ability for more upright posture and gait.   Goal status: PROGRESSING  3.  Patient to demo improved strength of  bil hips to 4+5,  to improve stability and pain  Goal status: partially met    4.  Pt to obtain and be complaint with footwear/heel lift as appropriate for her leg length discrepancy, to allow for improved gait pattern.   Goal status: MET   PLAN: PT FREQUENCY: 1-2x/week  PT DURATION: 6 weeks  PLANNED INTERVENTIONS: Therapeutic exercises, Therapeutic activity, Neuromuscular re-education, Balance training, Gait training, Patient/Family education, Self Care, Joint mobilization, Joint manipulation, Stair training, Orthotic/Fit training, DME instructions, Dry Needling, Electrical stimulation, Spinal mobilization, Moist heat, Taping, Traction, Ultrasound, Ionotophoresis 4mg /ml Dexamethasone , Manual therapy, and Neuro Muscular re-education  PLAN FOR NEXT SESSION:  hold at this time, pt going to have ortho consult for hip replacement   Terrilee Few, PT, DPT 8:52 AM  05/24/23  PHYSICAL THERAPY DISCHARGE SUMMARY  Visits from Start of Care: 12    Plan: Patient agrees to discharge.  Patient goals were  met. Patient is being discharged due to meeting the stated rehab goals.    Terrilee Few, PT, DPT 3:57 PM  10/07/23

## 2023-06-02 ENCOUNTER — Ambulatory Visit: Payer: PPO | Admitting: Orthopaedic Surgery

## 2023-06-02 DIAGNOSIS — M25552 Pain in left hip: Secondary | ICD-10-CM | POA: Diagnosis not present

## 2023-06-02 DIAGNOSIS — M1612 Unilateral primary osteoarthritis, left hip: Secondary | ICD-10-CM | POA: Insufficient documentation

## 2023-06-02 DIAGNOSIS — F3171 Bipolar disorder, in partial remission, most recent episode hypomanic: Secondary | ICD-10-CM | POA: Diagnosis not present

## 2023-06-02 NOTE — Progress Notes (Signed)
 The patient is a very pleasant 63 year old female sent to me from Dr. Artist Lloyd due to significant arthritis in her left hip.  She does have plain films and a MRI of her left hip on the canopy system for me to review.  She is a thin and active 63 year old female.  She does have a history of lumbar spine surgery at L4-L5 as well as a remote history of a left knee replacement.  She does report a leg length difference but it is her right lower extremity that shorter than her left side.  She is asymptomatic on the right side.  She has been dealing with left hip pain for several years now.  It has gotten significant worse over the last year.  It is detrimentally affecting her mobility, her quality of life and her actives daily living.  I was able to review all of her medications and past medical history within epic.  On exam her right hip moves smoothly and fluidly but her left hip has significant deficits in terms of rotation.  There is a lot of pain in the groin with internal and external rotation.  When I have her lay in a supine position she does have a leg length difference but her left side is longer than the right side.   Imaging studies were reviewed with her of her left hip and pelvis as well as plain films and MRI shows significant arthritis of the left hip.  There is superior lateral flattening of the femoral head and osteophytes off of the femoral head.  The MRI shows degenerative labral tearing that is significant and moderate to severe arthritis with cartilage wear of the left hip.  She does have mild arthritis in the right hip but she is still asymptomatic.  We had a long and thorough discussion about hip replacement surgery.  We discussed the risk and benefits of the surgery and what to expect from an intraoperative and postoperative standpoint.  I went over her imaging studies and gave her handout about hip replacement surgery.  She would like to go ahead and have this scheduled in the near  future.  All question concerns were answered and addressed.  She knows we will be in touch to schedule the surgery.

## 2023-06-08 ENCOUNTER — Encounter: Payer: Self-pay | Admitting: Orthopaedic Surgery

## 2023-06-10 ENCOUNTER — Other Ambulatory Visit: Payer: Self-pay | Admitting: Medical Genetics

## 2023-06-21 DIAGNOSIS — H35033 Hypertensive retinopathy, bilateral: Secondary | ICD-10-CM | POA: Diagnosis not present

## 2023-06-21 DIAGNOSIS — H34812 Central retinal vein occlusion, left eye, with macular edema: Secondary | ICD-10-CM | POA: Diagnosis not present

## 2023-06-21 DIAGNOSIS — H43813 Vitreous degeneration, bilateral: Secondary | ICD-10-CM | POA: Diagnosis not present

## 2023-06-22 DIAGNOSIS — F3171 Bipolar disorder, in partial remission, most recent episode hypomanic: Secondary | ICD-10-CM | POA: Diagnosis not present

## 2023-07-07 ENCOUNTER — Other Ambulatory Visit (HOSPITAL_COMMUNITY): Payer: Self-pay | Attending: Medical Genetics

## 2023-07-07 ENCOUNTER — Other Ambulatory Visit: Payer: Self-pay

## 2023-07-07 DIAGNOSIS — F900 Attention-deficit hyperactivity disorder, predominantly inattentive type: Secondary | ICD-10-CM | POA: Diagnosis not present

## 2023-07-07 DIAGNOSIS — G4700 Insomnia, unspecified: Secondary | ICD-10-CM | POA: Diagnosis not present

## 2023-07-07 DIAGNOSIS — F3181 Bipolar II disorder: Secondary | ICD-10-CM | POA: Diagnosis not present

## 2023-07-07 DIAGNOSIS — F4321 Adjustment disorder with depressed mood: Secondary | ICD-10-CM | POA: Diagnosis not present

## 2023-07-14 DIAGNOSIS — F3171 Bipolar disorder, in partial remission, most recent episode hypomanic: Secondary | ICD-10-CM | POA: Diagnosis not present

## 2023-07-18 NOTE — Progress Notes (Signed)
 COVID Vaccine received:  []  No [x]  Yes Date of any COVID positive Test in last 90 days:   None  PCP - Alysia Penna, MD [phone]  Cardiologist - none  Chest x-ray - 12-29-2016 2v  Epic EKG -12-29-2016  Epic   Stress Test -  ECHO - 05-14-2023  Epic Cardiac Cath -  Cardiac Monitor- 05-06-2023  Epic  PCR screen: [x]  Ordered & Completed []   No Order but Needs PROFEND     []   N/A for this surgery  Surgery Plan:  []  Ambulatory   [x]  Outpatient in bed  []  Admit Anesthesia:    []  General  [x]  Spinal  []   Choice []   MAC  Pacemaker / ICD device [x]  No []  Yes   Spinal Cord Stimulator:[x]  No []  Yes       History of Sleep Apnea? []  No [x]  Yes   CPAP used?- [x]  No []  Yes  lost 60+ lbs.  Does the patient monitor blood sugar?   [x]  N/A   []  No []  Yes  Patient has: [x]  NO Hx DM   []  Pre-DM   []  DM1  []   DM2 Last A1c was: 5.4 normal on 08-20-2020      Blood Thinner / Instructions:none Aspirin Instructions: none  ERAS Protocol Ordered: []  No  [x]  Yes PRE-SURGERY [x]  ENSURE  []  G2   Patient is to be NPO after: 0930  Dental hx: []  Dentures:  [x]  N/A      []  Bridge or Partial:                   []  Loose or Damaged teeth:   Comments: Patient was given the 5 CHG shower / bath instructions for THA surgery along with 2 bottles of the CHG soap. Patient will start this on: 07-26-2023 All questions were asked and answered, Patient voiced understanding of this process.   Activity level: Patient is able / unable to climb a flight of stairs without difficulty; [x]  No CP  [x]  No SOB, but would have back and hip pain. Patient can perform ADLs without assistance.  HTN, ADD, Bipolar, OSA- No CPAP lost 60+ lbs,  "Hard to wake up and BP is very low, happened several "    Patient denies shortness of breath, fever, cough and chest pain at PAT appointment.  Patient verbalized understanding and agreement to the Pre-Surgical Instructions that were given to them at this PAT appointment. Patient was also educated of  the need to review these PAT instructions again prior to her surgery.I reviewed the appropriate phone numbers to call if they have any and questions or concerns.

## 2023-07-18 NOTE — Patient Instructions (Signed)
 SURGICAL WAITING ROOM VISITATION Patients having surgery or a procedure may have no more than 2 support people in the waiting area - these visitors may rotate in the visitor waiting room.   Due to an increase in RSV and influenza rates and associated hospitalizations, children ages 57 and under may not visit patients in [facility] hospitals. If the patient needs to stay at the hospital during part of their recovery, the visitor guidelines for inpatient rooms apply.  PRE-OP VISITATION  Pre-op nurse will coordinate an appropriate time for 1 support person to accompany the patient in pre-op.  This support person may not rotate.  This visitor will be contacted when the time is appropriate for the visitor to come back in the pre-op area.  Please refer to the [facility] website for the visitor guidelines for Inpatients (after your surgery is over and you are in a regular room).  You are not required to quarantine at this time prior to your surgery. However, you must do this: Hand Hygiene often Do NOT share personal items Notify your provider if you are in close contact with someone who has COVID or you develop fever 100.4 or greater, new onset of sneezing, cough, sore throat, shortness of breath or body aches.  If you test positive for Covid or have been in contact with anyone that has tested positive in the last 10 days please notify you surgeon.    Your procedure is scheduled on:  FRIDAY  July 30, 2023  Report to [facility] Main Entrance: [facility] entrance where the [employer] is available.   Report to admitting at:  10:00   AM  Call this number if you have any questions or problems the morning of surgery [phone]  Do not eat food after Midnight the night prior to your surgery/procedure.  After Midnight you may have the following liquids until  09:30 AM DAY OF SURGERY  Clear Liquid Diet Water Black Coffee (sugar ok, NO MILK/CREAM OR CREAMERS)  Tea (sugar ok, NO  MILK/CREAM OR CREAMERS) regular and decaf                             Plain Jell-O  with no fruit (NO RED)                                           Fruit ices (not with fruit pulp, NO RED)                                     Popsicles (NO RED)                                                                  Juice: NO CITRUS JUICES: only apple, WHITE grape, WHITE cranberry Sports drinks like Gatorade or Powerade (NO RED)                   The day of surgery:  Drink ONE (1) Pre-Surgery Clear Ensure at  09:30 AM the morning of surgery. Drink  in one sitting. Do not sip.  This drink was given to you during your hospital pre-op appointment visit. Nothing else to drink after completing the Pre-Surgery Clear Ensure : No candy, chewing gum or throat lozenges.    FOLLOW ANY ADDITIONAL PRE OP INSTRUCTIONS YOU RECEIVED FROM YOUR SURGEON'S OFFICE!!!   Oral Hygiene is also important to reduce your risk of infection.        Remember - BRUSH YOUR TEETH THE MORNING OF SURGERY WITH YOUR REGULAR TOOTHPASTE  Do NOT smoke after Midnight the night before surgery.  STOP TAKING all Vitamins, Herbs and supplements 1 week before your surgery.   Take ONLY these medicines the morning of surgery with A SIP OF WATER: cetirizine (Zyrtec). You may use your Nasal spray and Restasis Eye drops if needed.   You may not have any metal on your body including hair pins, jewelry, and body piercing  Do not wear make-up, lotions, powders, perfumes or deodorant  Do not wear nail polish including gel and S&S, artificial / acrylic nails, or any other type of covering on natural nails including finger and toenails. If you have artificial nails, gel coating, etc., that needs to be removed by a nail salon, Please have this removed prior to surgery. Not doing so may mean that your surgery could be cancelled or delayed if the Surgeon or anesthesia staff feels like they are unable to monitor you safely.   Do not shave 48 hours prior  to surgery to avoid nicks in your skin which may contribute to postoperative infections.    Contacts, Hearing Aids, dentures or bridgework may not be worn into surgery. DENTURES WILL BE REMOVED PRIOR TO SURGERY PLEASE DO NOT APPLY "Poly grip" OR ADHESIVES!!!  You may bring a small overnight bag with you on the day of surgery, only pack items that are not valuable. [facility] IS NOT RESPONSIBLE   FOR VALUABLES THAT ARE LOST OR STOLEN.   Do not bring your home medications to the hospital. The Pharmacy will dispense medications listed on your medication list to you during your admission in the Hospital.  Please read over the following fact sheets you were given: IF YOU HAVE QUESTIONS ABOUT YOUR PRE-OP INSTRUCTIONS, PLEASE CALL [phone].     Pre-operative 5 CHG Bath Instructions   You can play a key role in reducing the risk of infection after surgery. Your skin needs to be as free of germs as possible. You can reduce the number of germs on your skin by washing with CHG (chlorhexidine gluconate) soap before surgery. CHG is an antiseptic soap that kills germs and continues to kill germs even after washing.   DO NOT use if you have an allergy to chlorhexidine/CHG or antibacterial soaps. If your skin becomes reddened or irritated, stop using the CHG and notify one of our RNs at [phone]  Please shower with the CHG soap starting 4 days before surgery using the following schedule: START SHOWERS ON   MONDAY  July 26, 2023  Please keep in mind the following:  DO NOT shave, including legs and underarms, starting the day of your first shower.   You may shave your face at any point before/day of surgery.   Place clean sheets on your bed the day you start using CHG soap. Use a clean washcloth (not used since being washed)  for each shower. DO NOT sleep with pets once you start using the CHG.   CHG Shower Instructions:  If you choose to wash your hair and private area, wash first with your normal shampoo/soap.  After you use shampoo/soap, rinse your hair and body thoroughly to remove shampoo/soap residue.  Turn the water OFF and apply about 3 tablespoons (45 ml) of CHG soap to a CLEAN washcloth.  Apply CHG soap ONLY FROM YOUR NECK DOWN TO YOUR TOES (washing for 3-5 minutes)  DO NOT use CHG soap on face, private areas, open wounds, or sores.  Pay special attention to the area where your surgery is being performed.  If you are having back surgery, having someone wash your back for you may be helpful.  Wait 2 minutes after CHG soap is applied, then you may rinse off the CHG soap.  Pat dry with a clean towel  Put on clean clothes/pajamas   If you choose to wear lotion, please use ONLY the CHG-compatible lotions on the back of this paper.     Additional instructions for the day of surgery: DO NOT APPLY any lotions, deodorants, cologne, or perfumes.   Put on clean/comfortable clothes.  Brush your teeth.  Ask your nurse before applying any prescription medications to the skin.      CHG Compatible Lotions   Aveeno Moisturizing lotion  Cetaphil Moisturizing Cream  Cetaphil Moisturizing Lotion  Clairol Herbal Essence Moisturizing Lotion, Dry Skin  Clairol Herbal Essence Moisturizing Lotion, Extra Dry Skin  Clairol Herbal Essence Moisturizing Lotion, Normal Skin  Curel Age Defying Therapeutic Moisturizing Lotion with Alpha Hydroxy  Curel Extreme Care Body Lotion  Curel Soothing Hands Moisturizing Hand Lotion  Curel Therapeutic Moisturizing Cream, Fragrance-Free  Curel Therapeutic Moisturizing Lotion, Fragrance-Free  Curel Therapeutic Moisturizing Lotion, Original Formula  Eucerin Daily Replenishing Lotion  Eucerin Dry Skin Therapy Plus Alpha Hydroxy Crme  Eucerin Dry Skin Therapy Plus Alpha Hydroxy  Lotion  Eucerin Original Crme  Eucerin Original Lotion  Eucerin Plus Crme Eucerin Plus Lotion  Eucerin TriLipid Replenishing Lotion  Keri Anti-Bacterial Hand Lotion  Keri Deep Conditioning Original Lotion Dry Skin Formula Softly Scented  Keri Deep Conditioning Original Lotion, Fragrance Free Sensitive Skin Formula  Keri Lotion Fast Absorbing Fragrance Free Sensitive Skin Formula  Keri Lotion Fast Absorbing Softly Scented Dry Skin Formula  Keri Original Lotion  Keri Skin Renewal Lotion Keri Silky Smooth Lotion  Keri Silky Smooth Sensitive Skin Lotion  Nivea Body Creamy Conditioning Oil  Nivea Body Extra Enriched Lotion  Nivea Body Original Lotion  Nivea Body Sheer Moisturizing Lotion Nivea Crme  Nivea Skin Firming Lotion  NutraDerm 30 Skin Lotion  NutraDerm Skin Lotion  NutraDerm Therapeutic Skin Cream  NutraDerm Therapeutic Skin Lotion  ProShield Protective Hand Cream  Provon moisturizing lotion   FAILURE TO FOLLOW THESE INSTRUCTIONS MAY RESULT IN THE CANCELLATION OF YOUR SURGERY  PATIENT SIGNATURE_________________________________  NURSE SIGNATURE__________________________________  ________________________________________________________________________        Rogelia Mire    An incentive spirometer is a tool that can help keep your lungs clear and active. This tool measures how well you are filling your lungs with each breath.  Taking long deep breaths may help reverse or decrease the chance of developing breathing (pulmonary) problems (especially infection) following: A long period of time when you are unable to move or be active. BEFORE THE PROCEDURE  If the spirometer includes an indicator to show your best effort, your nurse or respiratory therapist will set it to a desired goal. If possible, sit up straight or lean slightly forward. Try not to slouch. Hold the incentive spirometer in an upright position. INSTRUCTIONS FOR USE  Sit on the edge of your  bed if possible, or sit up as far as you can in bed or on a chair. Hold the incentive spirometer in an upright position. Breathe out normally. Place the mouthpiece in your mouth and seal your lips tightly around it. Breathe in slowly and as deeply as possible, raising the piston or the ball toward the top of the column. Hold your breath for 3-5 seconds or for as long as possible. Allow the piston or ball to fall to the bottom of the column. Remove the mouthpiece from your mouth and breathe out normally. Rest for a few seconds and repeat Steps 1 through 7 at least 10 times every 1-2 hours when you are awake. Take your time and take a few normal breaths between deep breaths. The spirometer may include an indicator to show your best effort. Use the indicator as a goal to work toward during each repetition. After each set of 10 deep breaths, practice coughing to be sure your lungs are clear. If you have an incision (the cut made at the time of surgery), support your incision when coughing by placing a pillow or rolled up towels firmly against it. Once you are able to get out of bed, walk around indoors and cough well. You may stop using the incentive spirometer when instructed by your caregiver.  RISKS AND COMPLICATIONS Take your time so you do not get dizzy or light-headed. If you are in pain, you may need to take or ask for pain medication before doing incentive spirometry. It is harder to take a deep breath if you are having pain. AFTER USE Rest and breathe slowly and easily. It can be helpful to keep track of a log of your progress. Your caregiver can provide you with a simple table to help with this. If you are using the spirometer at home, follow these instructions: SEEK MEDICAL CARE IF:  You are having difficultly using the spirometer. You have trouble using the spirometer as often as instructed. Your pain medication is not giving enough relief while using the spirometer. You develop fever  of 100.5 F (38.1 C) or higher.                                                                                                    SEEK IMMEDIATE MEDICAL CARE IF:  You cough up bloody sputum that had not been present before. You develop fever of 102 F (38.9 C) or greater. You develop worsening pain at or near the incision site. MAKE SURE YOU:  Understand these instructions. Will watch your  condition. Will get help right away if you are not doing well or get worse. Document Released: 09/21/2006 Document Revised: 08/03/2011 Document Reviewed: 11/22/2006 [facility] Patient Information 2014 [place], [place].       WHAT IS A BLOOD TRANSFUSION? Blood Transfusion Information  A transfusion is the replacement of blood or some of its parts. Blood is made up of multiple cells which provide different functions. Red blood cells carry oxygen and are used for blood loss replacement. White blood cells fight against infection. Platelets control bleeding. Plasma helps clot blood. Other blood products are available for specialized needs, such as hemophilia or other clotting disorders. BEFORE THE TRANSFUSION  Who gives blood for transfusions?  Healthy volunteers who are fully evaluated to make sure their blood is safe. This is blood bank blood. Transfusion therapy is the safest it has ever been in the practice of medicine. Before blood is taken from a donor, a complete history is taken to make sure that person has no history of diseases nor engages in risky social behavior (examples are intravenous drug use or sexual activity with multiple partners). The donor's travel history is screened to minimize risk of transmitting infections, such as malaria. The donated blood is tested for signs of infectious diseases, such as HIV and hepatitis. The blood is then tested to be sure it is compatible with you in order to minimize the chance of a transfusion reaction. If you or a relative donates blood, this is often  done in anticipation of surgery and is not appropriate for emergency situations. It takes many days to process the donated blood. RISKS AND COMPLICATIONS Although transfusion therapy is very safe and saves many lives, the main dangers of transfusion include:  Getting an infectious disease. Developing a transfusion reaction. This is an allergic reaction to something in the blood you were given. Every precaution is taken to prevent this. The decision to have a blood transfusion has been considered carefully by your caregiver before blood is given. Blood is not given unless the benefits outweigh the risks. AFTER THE TRANSFUSION Right after receiving a blood transfusion, you will usually feel much better and more energetic. This is especially true if your red blood cells have gotten low (anemic). The transfusion raises the level of the red blood cells which carry oxygen, and this usually causes an energy increase. The nurse administering the transfusion will monitor you carefully for complications. HOME CARE INSTRUCTIONS  No special instructions are needed after a transfusion. You may find your energy is better. Speak with your caregiver about any limitations on activity for underlying diseases you may have. SEEK MEDICAL CARE IF:  Your condition is not improving after your transfusion. You develop redness or irritation at the intravenous (IV) site. SEEK IMMEDIATE MEDICAL CARE IF:  Any of the following symptoms occur over the next 12 hours: Shaking chills. You have a temperature by mouth above 102 F (38.9 C), not controlled by medicine. Chest, back, or muscle pain. People around you feel you are not acting correctly or are confused. Shortness of breath or difficulty breathing. Dizziness and fainting. You get a rash or develop hives. You have a decrease in urine output. Your urine turns a dark color or changes to pink, red, or brown. Any of the following symptoms occur over the next 10  days: You have a temperature by mouth above 102 F (38.9 C), not controlled by medicine. Shortness of breath. Weakness after normal activity. The white part of the eye turns yellow (jaundice). You  have a decrease in the amount of urine or are urinating less often. Your urine turns a dark color or changes to pink, red, or brown. Document Released: 05/08/2000 Document Revised: 08/03/2011 Document Reviewed: 12/26/2007 [facility] Patient Information 2014 [place], [place].  _______________________________________________________________________       If you would like to see a video about joint replacement:   [email or web address]

## 2023-07-19 ENCOUNTER — Other Ambulatory Visit: Payer: Self-pay

## 2023-07-19 ENCOUNTER — Encounter (HOSPITAL_COMMUNITY)
Admission: RE | Admit: 2023-07-19 | Discharge: 2023-07-19 | Disposition: A | Discharge status: Home / Self Care | Payer: Self-pay | Source: Ambulatory Visit | Attending: Orthopaedic Surgery

## 2023-07-19 ENCOUNTER — Encounter (HOSPITAL_COMMUNITY): Payer: Self-pay

## 2023-07-19 VITALS — BP 115/69 | HR 72 | Temp 98.3°F | Resp 16 | Ht 66.0 in | Wt 142.0 lb

## 2023-07-19 DIAGNOSIS — Z01818 Encounter for other preprocedural examination: Secondary | ICD-10-CM | POA: Diagnosis not present

## 2023-07-19 DIAGNOSIS — I10 Essential (primary) hypertension: Secondary | ICD-10-CM | POA: Diagnosis not present

## 2023-07-19 DIAGNOSIS — M1612 Unilateral primary osteoarthritis, left hip: Secondary | ICD-10-CM | POA: Insufficient documentation

## 2023-07-19 HISTORY — DX: Cardiac arrhythmia, unspecified: I49.9

## 2023-07-19 LAB — SURGICAL PCR SCREEN
MRSA, PCR: NEGATIVE
Staphylococcus aureus: POSITIVE — AB

## 2023-07-19 LAB — CBC
HCT: 40.9 % (ref 36.0–46.0)
Hemoglobin: 13.9 g/dL (ref 12.0–15.0)
MCH: 30.5 pg (ref 26.0–34.0)
MCHC: 34 g/dL (ref 30.0–36.0)
MCV: 89.9 fL (ref 80.0–100.0)
Platelets: 270 10*3/uL (ref 150–400)
RBC: 4.55 MIL/uL (ref 3.87–5.11)
RDW: 11.5 % (ref 11.5–15.5)
WBC: 11.1 10*3/uL — ABNORMAL HIGH (ref 4.0–10.5)
nRBC: 0 % (ref 0.0–0.2)

## 2023-07-19 LAB — BASIC METABOLIC PANEL
Anion gap: 9 (ref 5–15)
BUN: 16 mg/dL (ref 8–23)
CO2: 26 mmol/L (ref 22–32)
Calcium: 9.8 mg/dL (ref 8.9–10.3)
Chloride: 103 mmol/L (ref 98–111)
Creatinine, Ser: 0.61 mg/dL (ref 0.44–1.00)
GFR, Estimated: >60 mL/min (ref >60)
Glucose, Bld: 99 mg/dL (ref 70–99)
Potassium: 4 mmol/L (ref 3.5–5.1)
Sodium: 138 mmol/L (ref 135–145)

## 2023-07-19 LAB — TYPE AND SCREEN
ABO/RH(D): AB NEG
Antibody Screen: NEGATIVE

## 2023-07-19 NOTE — Progress Notes (Signed)
 Patient's PCR screen is positive for  [facility]. Appropriate notes have been placed on the patient's chart. This note has been routed to Dr.Chris Magnus Ivan and Richardean Canal, PA for review. The Patient's surgery is currently scheduled for: 07-30-23 at [facility].  Rudean Haskell, BSN, CVRN-BC   Pre-Surgical Testing Nurse [facility]  [phone]

## 2023-07-20 DIAGNOSIS — I6523 Occlusion and stenosis of bilateral carotid arteries: Secondary | ICD-10-CM | POA: Diagnosis not present

## 2023-07-20 DIAGNOSIS — E785 Hyperlipidemia, unspecified: Secondary | ICD-10-CM | POA: Diagnosis not present

## 2023-07-20 DIAGNOSIS — G453 Amaurosis fugax: Secondary | ICD-10-CM | POA: Diagnosis not present

## 2023-07-20 DIAGNOSIS — H348192 Central retinal vein occlusion, unspecified eye, stable: Secondary | ICD-10-CM | POA: Diagnosis not present

## 2023-07-20 DIAGNOSIS — R0789 Other chest pain: Secondary | ICD-10-CM | POA: Diagnosis not present

## 2023-07-20 DIAGNOSIS — F319 Bipolar disorder, unspecified: Secondary | ICD-10-CM | POA: Diagnosis not present

## 2023-07-20 DIAGNOSIS — I10 Essential (primary) hypertension: Secondary | ICD-10-CM | POA: Diagnosis not present

## 2023-07-20 DIAGNOSIS — Z8673 Personal history of transient ischemic attack (TIA), and cerebral infarction without residual deficits: Secondary | ICD-10-CM | POA: Diagnosis not present

## 2023-07-20 DIAGNOSIS — F330 Major depressive disorder, recurrent, mild: Secondary | ICD-10-CM | POA: Diagnosis not present

## 2023-07-20 DIAGNOSIS — K219 Gastro-esophageal reflux disease without esophagitis: Secondary | ICD-10-CM | POA: Diagnosis not present

## 2023-07-20 DIAGNOSIS — R9431 Abnormal electrocardiogram [ECG] [EKG]: Secondary | ICD-10-CM | POA: Diagnosis not present

## 2023-07-21 DIAGNOSIS — F900 Attention-deficit hyperactivity disorder, predominantly inattentive type: Secondary | ICD-10-CM | POA: Diagnosis not present

## 2023-07-21 DIAGNOSIS — F3181 Bipolar II disorder: Secondary | ICD-10-CM | POA: Diagnosis not present

## 2023-07-21 DIAGNOSIS — G4700 Insomnia, unspecified: Secondary | ICD-10-CM | POA: Diagnosis not present

## 2023-07-21 DIAGNOSIS — F4321 Adjustment disorder with depressed mood: Secondary | ICD-10-CM | POA: Diagnosis not present

## 2023-07-26 ENCOUNTER — Telehealth: Payer: Self-pay | Admitting: Internal Medicine

## 2023-07-26 NOTE — Telephone Encounter (Signed)
   Name: Whitney Lewis  DOB: November 02, 1960  MRN: [id number]  Primary Cardiologist: None  Chart reviewed as part of pre-operative protocol coverage. The patient has an upcoming visit scheduled with Dr. Izora Ribas on 07/27/23 at which time clearance can be addressed in case there are any issues that would impact surgical recommendations.  Surgery is currently scheduled for 07/30/23 as below. I added preop FYI to appointment note so that provider is aware to address at time of outpatient visit.  Per office protocol the cardiology provider should forward their finalized clearance decision and recommendations to the requesting party below.    Procedure:   LEFT TOTAL HIP ARTHROPLASTY ANTERIOR APPROACH   Date of Surgery:  Clearance 07/30/23                                 Surgeon:  Kathryne Hitch, MD  Surgeon's Group or Practice Name:  [facility] Phone number:  [phone] Fax number:  [phone]   Type of Clearance Requested:   - Medical  - Pharmacy:  Hold TBD  by cardiology   Type of Anesthesia:  Spinal   Additional requests/questions:   Clearance requested by PCP due to pt being seen today for clearance and care everywhere stating "ECG changes on preop clearance for ortho . patient having atypical chest pain at times. needs urgent w/u by cards at [facility]"  This message will also be routed to pharmacy pool and/or Dr. Izora Ribas.   I will route this message as FYI to requesting party and remove this message from the preop box as separate preop APP input not needed at this time.   Please call with any questions.  Perlie Gold, PA-C  07/26/2023, 8:55 AM

## 2023-07-26 NOTE — Telephone Encounter (Signed)
   Pre-operative Risk Assessment    Patient Name: Whitney Lewis  DOB: Feb 24, 1961 MRN: [id number]      Request for Surgical Clearance    Procedure:   LEFT TOTAL HIP ARTHROPLASTY ANTERIOR APPROACH  Date of Surgery:  Clearance 07/30/23                                 Surgeon:  Kathryne Hitch, MD  Surgeon's Group or Practice Name:  [facility] Phone number:  [phone] Fax number:  [phone]   Type of Clearance Requested:   - Medical  - Pharmacy:  Hold TBD  by cardiology   Type of Anesthesia:  Spinal   Additional requests/questions:   Clearance requested by PCP due to pt being seen today for clearance and care everywhere stating "ECG changes on preop clearance for ortho . patient having atypical chest pain at times. needs urgent w/u by cards at [facility]"  Patient scheduled for a NP appt with Dr. Izora Ribas 03/04 at 2:20 pm.  Signed, Brooks Sailors   07/26/2023, 8:20 AM

## 2023-07-27 ENCOUNTER — Ambulatory Visit: Attending: Internal Medicine | Admitting: Internal Medicine

## 2023-07-27 VITALS — BP 110/62 | HR 80 | Ht 66.0 in | Wt 148.2 lb

## 2023-07-27 DIAGNOSIS — R0789 Other chest pain: Secondary | ICD-10-CM | POA: Insufficient documentation

## 2023-07-27 DIAGNOSIS — E782 Mixed hyperlipidemia: Secondary | ICD-10-CM

## 2023-07-27 DIAGNOSIS — R9431 Abnormal electrocardiogram [ECG] [EKG]: Secondary | ICD-10-CM | POA: Insufficient documentation

## 2023-07-27 DIAGNOSIS — F3171 Bipolar disorder, in partial remission, most recent episode hypomanic: Secondary | ICD-10-CM | POA: Diagnosis not present

## 2023-07-27 NOTE — Progress Notes (Signed)
 Cardiology Office Note:  .    Date:  07/27/2023  ID:  Whitney L Holland, DOB 01/18/1961, MRN [id number] PCP: Alysia Penna, MD  [facility] Providers Cardiologist:  None     CC: Pre-op Consulted for the evaluation of cardiac risk at the behest of Dr. Link Snuffer   History of Present Illness: .    Discussed the use of AI scribe software for clinical note transcription with the patient, who gave verbal consent to proceed.  Whitney Lewis is a 63 year old female with trivial mitral regurgitation and hyperlipidemia who presents with atypical chest discomfort and preoperative evaluation for hip surgery.  She has been experiencing atypical chest discomfort for several weeks, which she describes as 'massive heartburn' that occasionally makes her feel faint. She attributes this discomfort to heartburn and has been using Pepcid (famotidine) for relief. However, she noticed hair loss after about 30 days of use. She has a history of taking Aleve for hip pain, which she believes exacerbated her heartburn. After discontinuing Aleve about a week and a half ago, her heartburn symptoms have improved. No exertional symptoms or current chest pain are reported.  She has a history of trivial mitral regurgitation and premature atrial and ventricular contractions with rare non-sustained ventricular tachycardia. An echocardiogram from last year noted mild aortic dilation, but it was deemed not concerning given her age, gender, and body surface area. There is no evidence of significant mitral valve prolapse or arrhythmias that would increase surgical risk.  She has hyperlipidemia, currently managed with statin 20 mg, and her blood pressure is well controlled on Diovan HCT. She experiences statin myalgia, but her cholesterol levels are reasonably controlled. Her mother had heart issues in her late 12 and eighties, but there is no family history of early heart disease or aortic dissection.  She has  a history of a minor stroke, as evidenced by an MRI of her brain. This history is considered in her overall risk assessment for surgery, but it does not significantly increase her surgical risk.  In terms of physical activity, she was very active, walking a minimum of five miles a day until about six weeks ago when hip pain became too severe. She can still walk a block, climb a flight of stairs, and run short distances, although she avoids running due to hip pain. She recently participated in a baby shower, achieving 10,000 steps in a day.  Relevant histories: .  Social - I see one of her friends ROS: As per HPI.   Studies Reviewed: .   Cardiac Studies & Procedures   ______________________________________________________________________________________________     ECHOCARDIOGRAM  ECHOCARDIOGRAM COMPLETE 05/14/2023  Narrative ECHOCARDIOGRAM REPORT    Patient Name:   Whitney L Kelso Date of Exam: 05/14/2023 Medical Rec #:  [id number]          Height:       66.0 in Accession #:    [id number]         Weight:       147.0 lb Date of Birth:  04/21/1961          BSA:          1.755 m Patient Age:    62 years           BP:           118/78 mmHg Patient Gender: F                  HR:  62 bpm. Exam Location:  [address]  Procedure: 2D Echo, Cardiac Doppler and Color Doppler  Indications:    Retinal vein occlusion of left eye, unspecified retinal vein [[id number]]  History:        Patient has no prior history of Echocardiogram examinations. Risk Factors:Dyslipidemia, Hypertension and Sleep Apnea.  Sonographer:    Eulah Pont RDCS Referring Phys: Darlin Coco SCOTT HOLWERDA  IMPRESSIONS   1. Left ventricular ejection fraction, by estimation, is 55 to 60%. Left ventricular ejection fraction by 2D MOD biplane is 56.1 %. The left ventricle has normal function. The left ventricle has no regional wall motion abnormalities. There is mild asymmetric left ventricular hypertrophy of  the basal-septal segment. Left ventricular diastolic parameters are consistent with Grade I diastolic dysfunction (impaired relaxation). 2. Right ventricular systolic function is normal. The right ventricular size is normal. There is normal pulmonary artery systolic pressure. The estimated right ventricular systolic pressure is 20.0 mmHg. 3. The mitral valve is grossly normal. Trivial mitral valve regurgitation. 4. The aortic valve is tricuspid. Aortic valve regurgitation is not visualized. 5. Aortic dilatation noted. There is mild dilatation of the aortic root, measuring 38 mm. 6. The inferior vena cava is normal in size with greater than 50% respiratory variability, suggesting right atrial pressure of 3 mmHg.  Comparison(s): No prior Echocardiogram.  FINDINGS Left Ventricle: Left ventricular ejection fraction, by estimation, is 55 to 60%. Left ventricular ejection fraction by 2D MOD biplane is 56.1 %. The left ventricle has normal function. The left ventricle has no regional wall motion abnormalities. The left ventricular internal cavity size was normal in size. There is mild asymmetric left ventricular hypertrophy of the basal-septal segment. Left ventricular diastolic parameters are consistent with Grade I diastolic dysfunction (impaired relaxation). Indeterminate filling pressures.  Right Ventricle: The right ventricular size is normal. No increase in right ventricular wall thickness. Right ventricular systolic function is normal. There is normal pulmonary artery systolic pressure. The tricuspid regurgitant velocity is 2.06 m/s, and with an assumed right atrial pressure of 3 mmHg, the estimated right ventricular systolic pressure is 20.0 mmHg.  Left Atrium: Left atrial size was normal in size.  Right Atrium: Right atrial size was normal in size.  Pericardium: There is no evidence of pericardial effusion.  Mitral Valve: The mitral valve is grossly normal. Trivial mitral valve  regurgitation.  Tricuspid Valve: The tricuspid valve is grossly normal. Tricuspid valve regurgitation is trivial.  Aortic Valve: The aortic valve is tricuspid. Aortic valve regurgitation is not visualized.  Pulmonic Valve: The pulmonic valve was grossly normal. Pulmonic valve regurgitation is trivial.  Aorta: Aortic dilatation noted. There is mild dilatation of the aortic root, measuring 38 mm.  Venous: The inferior vena cava is normal in size with greater than 50% respiratory variability, suggesting right atrial pressure of 3 mmHg.  IAS/Shunts: No atrial level shunt detected by color flow Doppler.   LEFT VENTRICLE PLAX 2D                        Biplane EF (MOD) LVIDd:         3.92 cm         LV Biplane EF:   Left LVIDs:         2.04 cm                          ventricular LV PW:         0.82 cm  ejection LV IVS:        1.07 cm                          fraction by LVOT diam:     2.10 cm                          2D MOD LV SV:         70                               biplane is LV SV Index:   40                               56.1 %. LVOT Area:     3.46 cm Diastology LV e' medial:    7.78 cm/s LV Volumes (MOD)               LV E/e' medial:  13.4 LV vol d, MOD    57.4 ml       LV e' lateral:   8.19 cm/s A2C:                           LV E/e' lateral: 12.7 LV vol d, MOD    66.7 ml A4C: LV vol s, MOD    27.0 ml A2C: LV vol s, MOD    29.0 ml A4C: LV SV MOD A2C:   30.4 ml LV SV MOD A4C:   66.7 ml LV SV MOD BP:    34.9 ml  RIGHT VENTRICLE RV S prime:     12.50 cm/s TAPSE (M-mode): 1.8 cm  LEFT ATRIUM             Index        RIGHT ATRIUM          Index LA diam:        3.00 cm 1.71 cm/m   RA Area:     9.11 cm LA Vol (A2C):   26.5 ml 15.10 ml/m  RA Volume:   16.30 ml 9.29 ml/m LA Vol (A4C):   25.2 ml 14.36 ml/m LA Biplane Vol: 26.4 ml 15.05 ml/m AORTIC VALVE LVOT Vmax:   86.00 cm/s LVOT Vmean:  59.000 cm/s LVOT VTI:    0.201 m  AORTA Ao Root  diam: 3.80 cm Ao Asc diam:  3.70 cm  MV E velocity: 104.00 cm/s  TRICUSPID VALVE MV A velocity: 75.70 cm/s   TR Peak grad:   17.0 mmHg MV E/A ratio:  1.37         TR Vmax:        206.00 cm/s  SHUNTS Systemic VTI:  0.20 m Systemic Diam: 2.10 cm  Zoila Shutter MD Electronically signed by Zoila Shutter MD Signature Date/Time: 05/14/2023/3:21:54 PM    Final    MONITORS  CARDIAC EVENT MONITOR 05/06/2023  Narrative Extended EKG monitoring 13 days starting 04/14/2023: Minimum heart rate 50 bpm, maximum heart rate 130 bpm with average heart rate of 77 bpm. Predominant rhythm was normal sinus rhythm. There were 2 NSVT episodes, 5 beats at 3:08 PM on 04/15/2023 and 7 beats at 8:06 AM on 04/16/2023.  There are rare 13,617 PVCs in 13 days, PVC burden 1%. There were occasional PACs, PAC burden <1%.  There was no atrial fibrillation.  There was no heart block. No symptoms reported.       ______________________________________________________________________________________________      Physical Exam:    VS:  BP 110/62 (BP Location: Left Arm)   Pulse 80   Ht 5\' 6"  (1.676 m)   Wt 148 lb 3.2 oz (67.2 kg)   SpO2 97%   BMI 23.92 kg/m    Wt Readings from Last 3 Encounters:  07/27/23 148 lb 3.2 oz (67.2 kg)  07/19/23 142 lb (64.4 kg)  03/25/23 147 lb (66.7 kg)    Gen: no distress Neck: No JVD Cardiac: No Rubs or Gallops, holsystolic murmur, RRR +2 radial pulses Respiratory: Clear to auscultation bilaterally, normal effort, normal  respiratory rate GI: Soft, nontender, non-distended  MS: No  edema;  moves all extremities Integument: Skin feels warm Neuro:  At time of evaluation, alert and oriented to person/place/time/situation  Psych: Normal affect, patient feels well   ASSESSMENT AND PLAN: .    Atypical Chest Pain Intermittent chest discomfort with nonspecific T wave flattening on EKG, likely related to GERD clearly exacerbated by Aleve use. No exertional symptoms and  good functional capacity (greater than 6 METS). Low risk for cardiac etiology given history and current presentation. Preoperative risk stratification indicates low cardiac risk for upcoming hip surgery. - Proceed with hip surgery as planned - Send courtesy message to Dr. Doneen Poisson and anesthesiology team regarding cardiac assessment  Mitral Regurgitation Trivial mitral regurgitation with no significant change on recent echocardiogram. Not considered a precursor to atrial fibrillation at this time. 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 No significant findings; no symptoms  Gastroesophageal Reflux Disease (GERD) GERD exacerbation likely due to Aleve use. Symptoms improved after discontinuation of Aleve. Experiences hair loss with antacid use, complicating management. - Avoid Aleve to prevent GERD exacerbation - Consider alternative pain management strategies for hip pain  Hypertension Well-controlled hypertension on Diovan HCT.  Hyperlipidemia Hyperlipidemia managed with statin therapy, complicated by statin myalgia. Currently on 20 mg statin with reasonable control. Consideration for more aggressive lipid management post-surgery if needed. - Continue current statin therapy  History of Stroke Minor stroke noted on MRI. No current symptoms or significant risk factors identified.  PRN follow up  Riley Lam, MD FASE [facility] Cardiologist [facility]  [address] [place], [address] [place], [place] [address] [phone]  4:06 PM

## 2023-07-27 NOTE — Patient Instructions (Signed)
 Medication Instructions:  Your physician recommends that you continue on your current medications as directed. Please refer to the Current Medication list given to you today.  *If you need a refill on your cardiac medications before your next appointment, please call your pharmacy*   Lab Work: NONE  If you have labs (blood work) drawn today and your tests are completely normal, you will receive your results only by: MyChart Message (if you have MyChart) OR A paper copy in the mail If you have any lab test that is abnormal or we need to change your treatment, we will call you to review the results.   Testing/Procedures: NONE   Follow-Up:As needed At [facility], you and your health needs are our priority.  As part of our continuing mission to provide you with exceptional heart care, we have created designated Provider Care Teams.  These Care Teams include your primary Cardiologist (physician) and Advanced Practice Providers (APPs -  Physician Assistants and Nurse Practitioners) who all work together to provide you with the care you need, when you need it.   Provider:   Riley Lam, MD

## 2023-07-29 DIAGNOSIS — H35033 Hypertensive retinopathy, bilateral: Secondary | ICD-10-CM | POA: Diagnosis not present

## 2023-07-29 DIAGNOSIS — H34812 Central retinal vein occlusion, left eye, with macular edema: Secondary | ICD-10-CM | POA: Diagnosis not present

## 2023-07-29 DIAGNOSIS — H43813 Vitreous degeneration, bilateral: Secondary | ICD-10-CM | POA: Diagnosis not present

## 2023-07-29 NOTE — Anesthesia Preprocedure Evaluation (Addendum)
 Anesthesia Evaluation  Patient identified by MRN, date of birth, ID band Patient awake    Reviewed: Allergy & Precautions, NPO status , Patient's Chart, lab work & pertinent test results  History of Anesthesia Complications (+) history of anesthetic complications (hard to wake up)  Airway Mallampati: II  TM Distance: >3 FB Neck ROM: Full    Dental no notable dental hx. (+) Teeth Intact, Dental Advisory Given   Pulmonary    Pulmonary exam normal breath sounds clear to auscultation       Cardiovascular hypertension, Pt. on medications (-) angina (-) Past MI Normal cardiovascular exam+ Valvular Problems/Murmurs MR  Rhythm:Regular Rate:Normal  05/14/2023 echo  1. Left ventricular ejection fraction, by estimation, is 55 to 60%. Left  ventricular ejection fraction by 2D MOD biplane is 56.1 %. The left  ventricle has normal function. The left ventricle has no regional wall  motion abnormalities. There is mild  asymmetric left ventricular hypertrophy of the basal-septal segment. Left  ventricular diastolic parameters are consistent with Grade I diastolic  dysfunction (impaired relaxation).   2. Right ventricular systolic function is normal. The right ventricular  size is normal. There is normal pulmonary artery systolic pressure. The  estimated right ventricular systolic pressure is 20.0 mmHg.   3. The mitral valve is grossly normal. Trivial mitral valve  regurgitation.   4. The aortic valve is tricuspid. Aortic valve regurgitation is not  visualized.   5. Aortic dilatation noted. There is mild dilatation of the aortic root,  measuring 38 mm.   6. The inferior vena cava is normal in size with greater than 50%  respiratory variability, suggesting right atrial pressure of 3 mmHg.     Neuro/Psych  Headaches PSYCHIATRIC DISORDERS Anxiety Depression Bipolar Disorder   CVA    GI/Hepatic Neg liver ROS,GERD  ,,  Endo/Other  negative  endocrine ROS    Renal/GU Lab Results      Component                Value               Date                           K                        4.0                 07/19/2023                CO2                      26                  07/19/2023                BUN                      16                  07/19/2023                CREATININE               0.61                07/19/2023  GFRNONAA                 >60                 07/19/2023                          Musculoskeletal  (+) Arthritis , Osteoarthritis,  Fibromyalgia -  Abdominal   Peds  Hematology Lab Results      Component                Value               Date                      WBC                      11.1 (H)            07/19/2023                HGB                      13.9                07/19/2023                HCT                      40.9                07/19/2023                MCV                      89.9                07/19/2023                PLT                      270                 07/19/2023              Anesthesia Other Findings All: see list  Reproductive/Obstetrics                             Anesthesia Physical Anesthesia Plan  ASA: 3  Anesthesia Plan: Spinal   Post-op Pain Management: Regional block* and Minimal or no pain anticipated   Induction:   PONV Risk Score and Plan: Propofol infusion, Treatment may vary due to age or medical condition and Ondansetron  Airway Management Planned: Natural Airway and Nasal Cannula  Additional Equipment: None  Intra-op Plan:   Post-operative Plan: Extubation in OR  Informed Consent: I have reviewed the patients History and Physical, chart, labs and discussed the procedure including the risks, benefits and alternatives for the proposed anesthesia with the patient or authorized representative who has indicated his/her understanding and acceptance.     Dental advisory given  Plan Discussed with:  CRNA and Surgeon  Anesthesia Plan Comments:        Anesthesia Quick Evaluation

## 2023-07-29 NOTE — H&P (Signed)
 TOTAL HIP ADMISSION H&P  Patient is admitted for left total hip arthroplasty.  Subjective:  Chief Complaint: left hip pain  HPI: Whitney Lewis, 63 y.o. female, has a history of pain and functional disability in the left hip(s) due to arthritis and patient has failed non-surgical conservative treatments for greater than 12 weeks to include NSAID's and/or analgesics, corticosteriod injections, and activity modification.  Onset of symptoms was gradual starting 1 years ago with gradually worsening course since that time.The patient noted no past surgery on the left hip(s).  Patient currently rates pain in the left hip at 10 out of 10 with activity. Patient has night pain, worsening of pain with activity and weight bearing, pain that interfers with activities of daily living, and pain with passive range of motion. Patient has evidence of subchondral sclerosis, periarticular osteophytes, and joint space narrowing by imaging studies. This condition presents safety issues increasing the risk of falls.  There is no current active infection.  Patient Active Problem List   Diagnosis Date Noted   Atypical chest pain 07/27/2023   Nonspecific abnormal electrocardiogram (ECG) (EKG) 07/27/2023   Unilateral primary osteoarthritis, left hip 06/02/2023   Chronic left hip pain 04/13/2023   Metatarsal fracture 09/11/2021   S/P lumbar spinal fusion 01/01/2017   ADD (attention deficit disorder) 07/17/2015   Bipolar affective disorder (HCC) 07/17/2015   HLD (hyperlipidemia) 07/17/2015   BP (high blood pressure) 07/17/2015   Obstructive apnea 07/17/2015   Staphylococcus aureus infection    Foot osteomyelitis, right (HCC) 07/04/2015   Past Medical History:  Diagnosis Date   ADD (attention deficit disorder)    Anxiety    Arthritis    feet, knees (07/04/2015)   Bipolar disorder (HCC)    Complication of anesthesia    hard to wake up, blood pressure drops    Depression    Dysrhythmia    PVCs,   Family  history of adverse reaction to anesthesia    "sister hard to wake up also"   Fibromyalgia    GERD (gastroesophageal reflux disease)    Hyperlipidemia    Hypertension    Migraine    "hormonal; stopped when I was 16" (07/04/2015)   OSA (obstructive sleep apnea)    wears mouth piece, no CPAP   Seizures (HCC)     Past Surgical History:  Procedure Laterality Date   ABDOMINAL HYSTERECTOMY  ~ 1993   CESAREAN SECTION  1987   COLONOSCOPY     COLONOSCOPY WITH PROPOFOL N/A 09/21/2017   Procedure: COLONOSCOPY WITH PROPOFOL;  Surgeon: Christena Deem, MD;  Location: [facility] ENDOSCOPY;  Service: Endoscopy;  Laterality: N/A;   EYE SURGERY     gets injections in left retina, 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   HAMMER TOE SURGERY Right 03/2015   HAMMER TOE SURGERY Left 06/06/2015   Procedure: LEFT TWO-THREE  HAMMER TOE CORRECTION ;  Surgeon: Toni Arthurs, MD;  Location: [facility];  Service: Orthopedics;  Laterality: Left;   INCISION AND DRAINAGE OF WOUND Right 07/04/2015   Procedure: IRRIGATION AND DEBRIDEMENT OF RIGHT FOOT DEEP ABSCESS; EXCISION OF RIGHT SECOND METATARSAL HEAD; REMOVAL OF DEEP IMPLANT RIGHT FOOT; EXCISION OF RIGHT SECOND TOE PROXIMAL PHALANX BASE; INSERTION OF ANTIBIOTIC BEADS INTO RIGHT FOOT ABSCESS;  Surgeon: Toni Arthurs, MD;  Location: [facility] OR;  Service: Orthopedics;  Laterality: Right;  SITE ALSO  RIGHT FOOT   IRRIGATION AND DEBRIDEMENT FOOT Right 07/04/2015   Irrigation and excisional debridement of right foot deep abscess;; Insertion  of antibiotic beads into the right foot abscess   JOINT REPLACEMENT     MAXIMUM ACCESS (MAS)POSTERIOR LUMBAR INTERBODY FUSION (PLIF) 1 LEVEL N/A 01/01/2017   Procedure: L4-5 MAXIMUM ACCESS (MAS) POSTERIOR LUMBAR INTERBODY FUSION (PLIF);  Surgeon: Tia Alert, MD;  Location: [facility] OR;  Service: Neurosurgery;  Laterality: N/A;   TOTAL KNEE ARTHROPLASTY Left    WEIL OSTEOTOMY Left 06/06/2015   Procedure: LEFT TWO AND THREE METATARSAL WEIL  OSTEOTOMY ;  Surgeon: Toni Arthurs, MD;  Location: [facility];  Service: Orthopedics;  Laterality: Left;    No current facility-administered medications for this encounter.   Current Outpatient Medications  Medication Sig Dispense Refill Last Dose/Taking   amphetamine-dextroamphetamine (ADDERALL) 30 MG tablet Take 30 mg by mouth 2 (two) times daily.   Taking   cetirizine (ZYRTEC) 10 MG tablet Take 10 mg by mouth daily.   Taking   cycloSPORINE (RESTASIS) 0.05 % ophthalmic emulsion Place 1 drop into both eyes 2 (two) times daily.   Taking   Multiple Vitamin (MULTIVITAMIN WITH MINERALS) TABS tablet Take 1 tablet by mouth daily.   Taking   Multiple Vitamins-Minerals (PRESERVISION AREDS 2 PO) Take 1 capsule by mouth in the morning and at bedtime.   Taking   rosuvastatin (CRESTOR) 20 MG tablet Take 20 mg by mouth at bedtime.   Taking   sodium chloride (OCEAN) 0.65 % SOLN nasal spray Place 1 spray into both nostrils as needed for congestion.   Taking As Needed   valsartan-hydrochlorothiazide (DIOVAN-HCT) 160-12.5 MG tablet Take 1 tablet by mouth daily.   Taking   zolpidem (AMBIEN) 10 MG tablet Take 5-10 mg by mouth at bedtime as needed for sleep.   Taking As Needed   methylphenidate (RITALIN) 20 MG tablet 2 (two) times daily with breakfast and lunch.      naproxen sodium (ALEVE) 220 MG tablet as needed (pain).      Allergies  Allergen Reactions   Cefazolin Rash    Quick onset after 2nd dose of cefazolin, full body rash, no mucosal involvement   Acyclovir Other (See Comments)   Diclofenac Sodium Other (See Comments)   Voltaren [Diclofenac Sodium] Other (See Comments)   Codeine Nausea And Vomiting   Oxycodone-Acetaminophen Nausea And Vomiting    Social History   Tobacco Use   Smoking status: Never   Smokeless tobacco: Never  Substance Use Topics   Alcohol use: Yes    Alcohol/week: 6.0 standard drinks of alcohol    Types: 6 Glasses of wine per week    Comment: occasional none  last 24hrs    Family History  Problem Relation Age of Onset   Heart disease Mother    Breast cancer Neg Hx      Review of Systems  Objective:  Physical Exam Vitals reviewed.  Constitutional:      Appearance: Normal appearance. She is normal weight.  HENT:     Head: Normocephalic and atraumatic.  Eyes:     Extraocular Movements: Extraocular movements intact.     Pupils: Pupils are equal, round, and reactive to light.  Cardiovascular:     Rate and Rhythm: Normal rate and regular rhythm.     Pulses: Normal pulses.  Pulmonary:     Effort: Pulmonary effort is normal.     Breath sounds: Normal breath sounds.  Abdominal:     Palpations: Abdomen is soft.  Musculoskeletal:     Cervical back: Normal range of motion and neck supple.     Left  hip: Tenderness and bony tenderness present. Decreased range of motion. Decreased strength.  Neurological:     Mental Status: She is alert and oriented to person, place, and time.  Psychiatric:        Behavior: Behavior normal.     Vital signs in last 24 hours:    Labs:   Estimated body mass index is 23.92 kg/m as calculated from the following:   Height as of 07/27/23: 5\' 6"  (1.676 m).   Weight as of 07/27/23: 67.2 kg.   Imaging Review Plain radiographs demonstrate moderate degenerative joint disease of the left hip(s). The bone quality appears to be excellent for age and reported activity level.      Assessment/Plan:  End stage arthritis, left hip(s)  The patient history, physical examination, clinical judgement of the provider and imaging studies are consistent with end stage degenerative joint disease of the left hip(s) and total hip arthroplasty is deemed medically necessary. The treatment options including medical management, injection therapy, arthroscopy and arthroplasty were discussed at length. The risks and benefits of total hip arthroplasty were presented and reviewed. The risks due to aseptic loosening, infection,  stiffness, dislocation/subluxation,  thromboembolic complications and other imponderables were discussed.  The patient acknowledged the explanation, agreed to proceed with the plan and consent was signed. Patient is being admitted for inpatient treatment for surgery, pain control, PT, OT, prophylactic antibiotics, VTE prophylaxis, progressive ambulation and ADL's and discharge planning.The patient is planning to be discharged home with home health services

## 2023-07-30 ENCOUNTER — Ambulatory Visit (HOSPITAL_COMMUNITY): Payer: Self-pay | Admitting: Physician Assistant

## 2023-07-30 ENCOUNTER — Other Ambulatory Visit: Payer: Self-pay

## 2023-07-30 ENCOUNTER — Ambulatory Visit (HOSPITAL_COMMUNITY)

## 2023-07-30 ENCOUNTER — Observation Stay (HOSPITAL_COMMUNITY)

## 2023-07-30 ENCOUNTER — Encounter (HOSPITAL_COMMUNITY): Payer: Self-pay | Admitting: Orthopaedic Surgery

## 2023-07-30 ENCOUNTER — Observation Stay (HOSPITAL_COMMUNITY)
Admission: RE | Admit: 2023-07-30 | Discharge: 2023-08-02 | Disposition: A | Discharge status: Home Health | Payer: PPO | Source: Ambulatory Visit | Attending: Orthopaedic Surgery | Admitting: Orthopaedic Surgery

## 2023-07-30 ENCOUNTER — Encounter (HOSPITAL_COMMUNITY)
Admission: RE | Disposition: A | Discharge status: Home Health | Payer: Self-pay | Source: Ambulatory Visit | Attending: Orthopaedic Surgery

## 2023-07-30 DIAGNOSIS — Z96652 Presence of left artificial knee joint: Secondary | ICD-10-CM | POA: Diagnosis not present

## 2023-07-30 DIAGNOSIS — Z96642 Presence of left artificial hip joint: Secondary | ICD-10-CM

## 2023-07-30 DIAGNOSIS — G4733 Obstructive sleep apnea (adult) (pediatric): Secondary | ICD-10-CM | POA: Diagnosis not present

## 2023-07-30 DIAGNOSIS — M1612 Unilateral primary osteoarthritis, left hip: Principal | ICD-10-CM | POA: Insufficient documentation

## 2023-07-30 DIAGNOSIS — I10 Essential (primary) hypertension: Secondary | ICD-10-CM

## 2023-07-30 DIAGNOSIS — Z79899 Other long term (current) drug therapy: Secondary | ICD-10-CM | POA: Insufficient documentation

## 2023-07-30 DIAGNOSIS — E785 Hyperlipidemia, unspecified: Secondary | ICD-10-CM

## 2023-07-30 DIAGNOSIS — F418 Other specified anxiety disorders: Secondary | ICD-10-CM | POA: Diagnosis not present

## 2023-07-30 DIAGNOSIS — Z471 Aftercare following joint replacement surgery: Secondary | ICD-10-CM | POA: Diagnosis not present

## 2023-07-30 SURGERY — ARTHROPLASTY, HIP, TOTAL, ANTERIOR APPROACH
Anesthesia: Spinal | Site: Hip | Laterality: Left

## 2023-07-30 MED ORDER — DEXAMETHASONE SODIUM PHOSPHATE 10 MG/ML IJ SOLN
INTRAMUSCULAR | Status: AC
  Filled 2023-07-30: qty 1

## 2023-07-30 MED ORDER — FENTANYL CITRATE (PF) 100 MCG/2ML IJ SOLN
INTRAMUSCULAR | Status: DC | PRN
  Administered 2023-07-30 (×2): 50 ug via INTRAVENOUS

## 2023-07-30 MED ORDER — HYDROMORPHONE HCL 1 MG/ML IJ SOLN: 0.5000 mg | INTRAMUSCULAR | Status: DC | PRN

## 2023-07-30 MED ORDER — AMPHETAMINE-DEXTROAMPHETAMINE 30 MG PO TABS: 30.0000 mg | ORAL_TABLET | Freq: Two times a day (BID) | ORAL | Status: DC

## 2023-07-30 MED ORDER — PHENYLEPHRINE HCL-NACL 20-0.9 MG/250ML-% IV SOLN
INTRAVENOUS | Status: DC | PRN
  Administered 2023-07-30: 25 ug/min via INTRAVENOUS

## 2023-07-30 MED ORDER — OXYCODONE HCL 5 MG PO TABS
5.0000 mg | ORAL_TABLET | ORAL | Status: DC | PRN
  Administered 2023-07-31 – 2023-08-02 (×7): 10 mg via ORAL
  Filled 2023-07-30 (×2): qty 2
  Filled 2023-07-30: qty 1
  Filled 2023-07-30 (×4): qty 2

## 2023-07-30 MED ORDER — VANCOMYCIN HCL IN DEXTROSE 1-5 GM/200ML-% IV SOLN
1000.0000 mg | Freq: Two times a day (BID) | INTRAVENOUS | Status: AC
  Administered 2023-07-30: 1000 mg via INTRAVENOUS
  Filled 2023-07-30: qty 200

## 2023-07-30 MED ORDER — OXYCODONE HCL 5 MG/5ML PO SOLN: 5.0000 mg | Freq: Once | ORAL | Status: DC | PRN

## 2023-07-30 MED ORDER — LEVOFLOXACIN IN D5W 500 MG/100ML IV SOLN
500.0000 mg | INTRAVENOUS | Status: AC
  Administered 2023-07-30: 500 mg via INTRAVENOUS
  Filled 2023-07-30: qty 100

## 2023-07-30 MED ORDER — DEXAMETHASONE SODIUM PHOSPHATE 10 MG/ML IJ SOLN
INTRAMUSCULAR | Status: DC | PRN
  Administered 2023-07-30: 10 mg via INTRAVENOUS

## 2023-07-30 MED ORDER — FENTANYL CITRATE (PF) 100 MCG/2ML IJ SOLN
INTRAMUSCULAR | Status: AC
  Filled 2023-07-30: qty 2

## 2023-07-30 MED ORDER — ONDANSETRON HCL 4 MG/2ML IJ SOLN: 4.0000 mg | Freq: Once | INTRAMUSCULAR | Status: DC | PRN

## 2023-07-30 MED ORDER — ACETAMINOPHEN 10 MG/ML IV SOLN: 1000.0000 mg | Freq: Once | INTRAVENOUS | Status: DC | PRN

## 2023-07-30 MED ORDER — METOCLOPRAMIDE HCL 5 MG/ML IJ SOLN: 5.0000 mg | Freq: Three times a day (TID) | INTRAMUSCULAR | Status: DC | PRN

## 2023-07-30 MED ORDER — HYDROCHLOROTHIAZIDE 12.5 MG PO TABS
12.5000 mg | ORAL_TABLET | Freq: Every day | ORAL | Status: DC
  Administered 2023-07-31 – 2023-08-01 (×2): 12.5 mg via ORAL
  Filled 2023-07-30 (×2): qty 1

## 2023-07-30 MED ORDER — VALSARTAN-HYDROCHLOROTHIAZIDE 160-12.5 MG PO TABS: 1.0000 | ORAL_TABLET | Freq: Every day | ORAL | Status: DC

## 2023-07-30 MED ORDER — METHOCARBAMOL 500 MG PO TABS
ORAL_TABLET | ORAL | Status: AC
  Filled 2023-07-30: qty 1

## 2023-07-30 MED ORDER — PROPOFOL 10 MG/ML IV BOLUS
INTRAVENOUS | Status: DC | PRN
  Administered 2023-07-30: 40 mg via INTRAVENOUS

## 2023-07-30 MED ORDER — MIDAZOLAM HCL 2 MG/2ML IJ SOLN
INTRAMUSCULAR | Status: AC
  Filled 2023-07-30: qty 2

## 2023-07-30 MED ORDER — PROPOFOL 500 MG/50ML IV EMUL
INTRAVENOUS | Status: DC | PRN
  Administered 2023-07-30: 100 ug/kg/min via INTRAVENOUS

## 2023-07-30 MED ORDER — METOCLOPRAMIDE HCL 5 MG PO TABS: 5.0000 mg | ORAL_TABLET | Freq: Three times a day (TID) | ORAL | Status: DC | PRN

## 2023-07-30 MED ORDER — ONDANSETRON HCL 4 MG/2ML IJ SOLN: 4.0000 mg | Freq: Four times a day (QID) | INTRAMUSCULAR | Status: DC | PRN

## 2023-07-30 MED ORDER — MIDAZOLAM HCL 5 MG/5ML IJ SOLN
INTRAMUSCULAR | Status: DC | PRN
  Administered 2023-07-30: 2 mg via INTRAVENOUS

## 2023-07-30 MED ORDER — ONDANSETRON HCL 4 MG/2ML IJ SOLN
INTRAMUSCULAR | Status: DC | PRN
  Administered 2023-07-30: 4 mg via INTRAVENOUS

## 2023-07-30 MED ORDER — VANCOMYCIN HCL IN DEXTROSE 1-5 GM/200ML-% IV SOLN
1000.0000 mg | INTRAVENOUS | Status: AC
  Administered 2023-07-30: 1000 mg via INTRAVENOUS
  Filled 2023-07-30: qty 200

## 2023-07-30 MED ORDER — 0.9 % SODIUM CHLORIDE (POUR BTL) OPTIME
TOPICAL | Status: DC | PRN
  Administered 2023-07-30: 1000 mL

## 2023-07-30 MED ORDER — OXYCODONE HCL 5 MG PO TABS: 5.0000 mg | ORAL_TABLET | Freq: Once | ORAL | Status: DC | PRN

## 2023-07-30 MED ORDER — PANTOPRAZOLE SODIUM 40 MG PO TBEC
40.0000 mg | DELAYED_RELEASE_TABLET | Freq: Every day | ORAL | Status: DC
Start: 2023-07-30 — End: 2023-08-02
  Administered 2023-07-30: 40 mg via ORAL
  Filled 2023-07-30 (×2): qty 1

## 2023-07-30 MED ORDER — ZOLPIDEM TARTRATE 5 MG PO TABS: 5.0000 mg | ORAL_TABLET | Freq: Every evening | ORAL | Status: DC | PRN

## 2023-07-30 MED ORDER — TRANEXAMIC ACID-NACL 1000-0.7 MG/100ML-% IV SOLN
1000.0000 mg | INTRAVENOUS | Status: AC
  Administered 2023-07-30: 1000 mg via INTRAVENOUS
  Filled 2023-07-30: qty 100

## 2023-07-30 MED ORDER — LACTATED RINGERS IV SOLN: INTRAVENOUS | Status: DC

## 2023-07-30 MED ORDER — HYDROMORPHONE HCL 1 MG/ML IJ SOLN: 0.2500 mg | INTRAMUSCULAR | Status: DC | PRN

## 2023-07-30 MED ORDER — MUPIROCIN 2 % EX OINT: 1.0000 | TOPICAL_OINTMENT | Freq: Two times a day (BID) | CUTANEOUS | 0 refills | Status: AC

## 2023-07-30 MED ORDER — ORAL CARE MOUTH RINSE: 15.0000 mL | Freq: Once | OROMUCOSAL | Status: AC

## 2023-07-30 MED ORDER — MENTHOL 3 MG MT LOZG: 1.0000 | LOZENGE | OROMUCOSAL | Status: DC | PRN

## 2023-07-30 MED ORDER — PHENOL 1.4 % MT LIQD: 1.0000 | OROMUCOSAL | Status: DC | PRN

## 2023-07-30 MED ORDER — SODIUM CHLORIDE 0.9 % IV SOLN: INTRAVENOUS | Status: DC

## 2023-07-30 MED ORDER — ACETAMINOPHEN 325 MG PO TABS
325.0000 mg | ORAL_TABLET | Freq: Four times a day (QID) | ORAL | Status: DC | PRN
  Administered 2023-07-31: 325 mg via ORAL
  Administered 2023-08-01 – 2023-08-02 (×3): 650 mg via ORAL
  Filled 2023-07-30: qty 1
  Filled 2023-07-30 (×3): qty 2

## 2023-07-30 MED ORDER — METHYLPHENIDATE HCL 5 MG PO TABS
20.0000 mg | ORAL_TABLET | Freq: Two times a day (BID) | ORAL | Status: DC
  Filled 2023-07-30: qty 4

## 2023-07-30 MED ORDER — SODIUM CHLORIDE 0.9 % IR SOLN
Status: DC | PRN
  Administered 2023-07-30: 1000 mL

## 2023-07-30 MED ORDER — CYCLOSPORINE 0.05 % OP EMUL
1.0000 [drp] | Freq: Two times a day (BID) | OPHTHALMIC | Status: DC
  Administered 2023-07-30 – 2023-08-02 (×5): 1 [drp] via OPHTHALMIC
  Filled 2023-07-30 (×6): qty 30

## 2023-07-30 MED ORDER — ONDANSETRON HCL 4 MG PO TABS
4.0000 mg | ORAL_TABLET | Freq: Four times a day (QID) | ORAL | Status: DC | PRN
  Administered 2023-08-01: 4 mg via ORAL
  Filled 2023-07-30: qty 1

## 2023-07-30 MED ORDER — OXYCODONE HCL 5 MG PO TABS
10.0000 mg | ORAL_TABLET | ORAL | Status: DC | PRN
  Administered 2023-07-30 – 2023-07-31 (×3): 10 mg via ORAL
  Administered 2023-07-31 (×2): 15 mg via ORAL
  Administered 2023-07-31 – 2023-08-02 (×4): 10 mg via ORAL
  Filled 2023-07-30 (×4): qty 2
  Filled 2023-07-30: qty 3
  Filled 2023-07-30 (×2): qty 2
  Filled 2023-07-30 (×2): qty 3
  Filled 2023-07-30 (×2): qty 2

## 2023-07-30 MED ORDER — ONDANSETRON HCL 4 MG/2ML IJ SOLN
INTRAMUSCULAR | Status: AC
  Filled 2023-07-30: qty 2

## 2023-07-30 MED ORDER — ALUM & MAG HYDROXIDE-SIMETH 200-200-20 MG/5ML PO SUSP: 30.0000 mL | ORAL | Status: DC | PRN

## 2023-07-30 MED ORDER — METHOCARBAMOL 500 MG PO TABS
500.0000 mg | ORAL_TABLET | Freq: Four times a day (QID) | ORAL | Status: DC | PRN
  Administered 2023-07-30 – 2023-07-31 (×4): 500 mg via ORAL
  Filled 2023-07-30 (×3): qty 1

## 2023-07-30 MED ORDER — DOCUSATE SODIUM 100 MG PO CAPS
100.0000 mg | ORAL_CAPSULE | Freq: Two times a day (BID) | ORAL | Status: DC
  Administered 2023-07-30 – 2023-08-02 (×6): 100 mg via ORAL
  Filled 2023-07-30 (×6): qty 1

## 2023-07-30 MED ORDER — CHLORHEXIDINE GLUCONATE 4 % EX SOLN: 1.0000 | CUTANEOUS | 1 refills | Status: DC

## 2023-07-30 MED ORDER — AMISULPRIDE (ANTIEMETIC) 5 MG/2ML IV SOLN: 10.0000 mg | Freq: Once | INTRAVENOUS | Status: DC | PRN

## 2023-07-30 MED ORDER — ASPIRIN 81 MG PO CHEW
81.0000 mg | CHEWABLE_TABLET | Freq: Two times a day (BID) | ORAL | Status: DC
  Administered 2023-07-30 – 2023-08-02 (×6): 81 mg via ORAL
  Filled 2023-07-30 (×6): qty 1

## 2023-07-30 MED ORDER — CHLORHEXIDINE GLUCONATE 0.12 % MT SOLN
15.0000 mL | Freq: Once | OROMUCOSAL | Status: AC
  Administered 2023-07-30: 15 mL via OROMUCOSAL

## 2023-07-30 MED ORDER — POVIDONE-IODINE 10 % EX SWAB: 2.0000 | Freq: Once | CUTANEOUS | Status: DC

## 2023-07-30 MED ORDER — IRBESARTAN 150 MG PO TABS
150.0000 mg | ORAL_TABLET | Freq: Every day | ORAL | Status: DC
  Administered 2023-07-31 – 2023-08-01 (×2): 150 mg via ORAL
  Filled 2023-07-30 (×2): qty 1

## 2023-07-30 MED ORDER — ACETAMINOPHEN 10 MG/ML IV SOLN
INTRAVENOUS | Status: AC
  Administered 2023-07-30: 1000 mg via INTRAVENOUS
  Filled 2023-07-30: qty 100

## 2023-07-30 MED ORDER — METHOCARBAMOL 1000 MG/10ML IJ SOLN: 500.0000 mg | Freq: Four times a day (QID) | INTRAMUSCULAR | Status: DC | PRN

## 2023-07-30 SURGICAL SUPPLY — 39 items
BAG COUNTER SPONGE SURGICOUNT (BAG) ×1 IMPLANT
BAG ZIPLOCK 12X15 (MISCELLANEOUS) IMPLANT
BENZOIN TINCTURE PRP APPL 2/3 (GAUZE/BANDAGES/DRESSINGS) IMPLANT
BLADE SAW SGTL 18X1.27X75 (BLADE) ×1 IMPLANT
COVER PERINEAL POST (MISCELLANEOUS) ×1 IMPLANT
COVER SURGICAL LIGHT HANDLE (MISCELLANEOUS) ×1 IMPLANT
CUP SECTOR GRIPTON 50MM (Cup) IMPLANT
DRAPE FOOT SWITCH (DRAPES) ×1 IMPLANT
DRAPE STERI IOBAN 125X83 (DRAPES) ×1 IMPLANT
DRAPE U-SHAPE 47X51 STRL (DRAPES) ×2 IMPLANT
DRSG AQUACEL AG ADV 3.5X10 (GAUZE/BANDAGES/DRESSINGS) ×1 IMPLANT
DURAPREP 26ML APPLICATOR (WOUND CARE) ×1 IMPLANT
ELECT REM PT RETURN 15FT ADLT (MISCELLANEOUS) ×1 IMPLANT
GAUZE XEROFORM 1X8 LF (GAUZE/BANDAGES/DRESSINGS) IMPLANT
GLOVE BIO SURGEON STRL SZ7.5 (GLOVE) ×1 IMPLANT
GLOVE BIOGEL PI IND STRL 8 (GLOVE) ×2 IMPLANT
GLOVE ECLIPSE 8.0 STRL XLNG CF (GLOVE) ×1 IMPLANT
GOWN STRL REUS W/ TWL XL LVL3 (GOWN DISPOSABLE) ×2 IMPLANT
HEAD FEMORAL 32 CERAMIC (Hips) IMPLANT
HOLDER FOLEY CATH W/STRAP (MISCELLANEOUS) ×1 IMPLANT
KIT TURNOVER KIT A (KITS) IMPLANT
LINER ACET PNNCL PLUS4 NEUTRAL (Hips) IMPLANT
PACK ANTERIOR HIP CUSTOM (KITS) ×1 IMPLANT
PINNACLE PLUS 4 NEUTRAL (Hips) ×1 IMPLANT
SET HNDPC FAN SPRY TIP SCT (DISPOSABLE) ×1 IMPLANT
SLEEVE CABLE 2MM VT (Orthopedic Implant) IMPLANT
SLEEVE SCD COMPRESS KNEE MED (STOCKING) IMPLANT
STAPLER SKIN PROX WIDE 3.9 (STAPLE) IMPLANT
STEM FEM ACTIS HIGH SZ3 (Stem) IMPLANT
STRIP CLOSURE SKIN 1/2X4 (GAUZE/BANDAGES/DRESSINGS) IMPLANT
SUT ETHIBOND NAB CT1 #1 30IN (SUTURE) ×1 IMPLANT
SUT ETHILON 2 0 PS N (SUTURE) IMPLANT
SUT MNCRL AB 4-0 PS2 18 (SUTURE) IMPLANT
SUT VIC AB 0 CT1 36 (SUTURE) ×1 IMPLANT
SUT VIC AB 1 CT1 36 (SUTURE) ×1 IMPLANT
SUT VIC AB 2-0 CT1 TAPERPNT 27 (SUTURE) ×2 IMPLANT
TRAY FOLEY MTR SLVR 14FR STAT (SET/KITS/TRAYS/PACK) IMPLANT
TRAY FOLEY MTR SLVR 16FR STAT (SET/KITS/TRAYS/PACK) IMPLANT
YANKAUER SUCT BULB TIP NO VENT (SUCTIONS) ×1 IMPLANT

## 2023-07-30 NOTE — Anesthesia Postprocedure Evaluation (Signed)
 Anesthesia Post Note  Patient: Whitney Lewis  Procedure(s) Performed: LEFT TOTAL HIP ARTHROPLASTY ANTERIOR APPROACH (Left: Hip)     Patient location during evaluation: PACU Anesthesia Type: Spinal Level of consciousness: awake and alert Pain management: pain level controlled Vital Signs Assessment: post-procedure vital signs reviewed and stable Respiratory status: spontaneous breathing, nonlabored ventilation, respiratory function stable and patient connected to nasal cannula oxygen Cardiovascular status: blood pressure returned to baseline and stable Postop Assessment: no apparent nausea or vomiting Anesthetic complications: no  No notable events documented.  Last Vitals:  Vitals:   07/30/23 1445 07/30/23 1500  BP: 119/63 119/68  Pulse: 66 62  Resp: 15 11  Temp:  36.6 C  SpO2: 100% 100%    Last Pain:  Vitals:   07/30/23 1500  TempSrc:   PainSc: 0-No pain                 Trevor Iha

## 2023-07-30 NOTE — Evaluation (Signed)
 Physical Therapy Evaluation Patient Details Name: [place] L Minardi MRN: [id number] DOB: [birth year] Today's Date: 07/30/2023  History of Present Illness  63 yo female presents to therapy s/p L THA, anterior approach on 07/30/2023 due to failure of conservative measures. Pt PMH includes but is not limited to: angina, lumbar fusion, ADD, bipolar disorder, HLD, HTN, OSA, R osteomyelitis of foot, GERD, fibromyalgia, seizures, L TKA (2017).  Clinical Impression    [place] L Icard is a 63 y.o. female POD 0 s/p L THA. Patient reports IND with mobility at baseline. Patient is now limited by functional impairments (see PT problem list below) and requires min A for bed mobility and CGA for transfers. Patient was able to ambulate 10 feet with RW and CGA level of assist. Patient instructed in exercise to facilitate ROM and circulation to manage edema. Patient will benefit from continued skilled PT interventions to address impairments and progress towards PLOF. Acute PT will follow to progress mobility and stair training in preparation for safe discharge home with family support and [facility] services.       If plan is discharge home, recommend the following: A little help with walking and/or transfers;A little help with bathing/dressing/bathroom;Assistance with cooking/housework;Assist for transportation;Help with stairs or ramp for entrance   Can travel by private vehicle        Equipment Recommendations None recommended by PT  Recommendations for Other Services       Functional Status Assessment Patient has had a recent decline in their functional status and demonstrates the ability to make significant improvements in function in a reasonable and predictable amount of time.     Precautions / Restrictions Precautions Precautions: Fall Restrictions Weight Bearing Restrictions Per Provider Order: No      Mobility  Bed Mobility Overal bed mobility: Needs Assistance Bed Mobility: Supine to Sit      Supine to sit: Min assist     General bed mobility comments: cues and min A for L LE to EOB    Transfers Overall transfer level: Needs assistance Equipment used: Rolling walker (2 wheels) Transfers: Sit to/from Stand Sit to Stand: Contact guard assist           General transfer comment: min cues and pt able to push to stand with R UE    Ambulation/Gait Ambulation/Gait assistance: Contact guard assist Gait Distance (Feet): 10 Feet Assistive device: Rolling walker (2 wheels) Gait Pattern/deviations: Step-to pattern, Antalgic, Trunk flexed Gait velocity: decreased     General Gait Details: trunk flexion attributed to LBP as well as L hip pain, pt reported having pulled a mm in L LE and pinched nerve prior to surgery, pt exhibited difficulty lifting and placing L LE with pt sliding L LE atneriorly, pt limited by pain and decreased L LE sensation and motor control  Stairs            Wheelchair Mobility     Tilt Bed    Modified Rankin (Stroke Patients Only)       Balance Overall balance assessment: Needs assistance Sitting-balance support: Feet supported Sitting balance-Leahy Scale: Good     Standing balance support: Bilateral upper extremity supported, During functional activity, Reliant on assistive device for balance Standing balance-Leahy Scale: Poor                               Pertinent Vitals/Pain Pain Assessment Pain Assessment: 0-10 Pain Score: 6  Pain Location: L hip and LE  as well as back Pain Descriptors / Indicators: Aching, Constant, Discomfort, Dull, Grimacing, Operative site guarding Pain Intervention(s): Limited activity within patient's tolerance, Monitored during session, Premedicated before session, Repositioned, Patient requesting pain meds-RN notified, Ice applied    Home Living Family/patient expects to be discharged to:: Private residence Living Arrangements: Spouse/significant other Available Help at Discharge:  Family Type of Home: House Home Access: Stairs to enter Entrance Stairs-Rails: None Entrance Stairs-Number of Steps: 2   Home Layout: Two level;Able to live on main level with bedroom/bathroom Home Equipment: Rolling Walker (2 wheels);Crutches      Prior Function Prior Level of Function : Independent/Modified Independent;Driving             Mobility Comments: IND no AD for all ADLs, self care tasks and IADLs       Extremity/Trunk Assessment        Lower Extremity Assessment Lower Extremity Assessment: LLE deficits/detail LLE Deficits / Details: ankle DF/PF 5/5 LLE Sensation: decreased light touch;decreased proprioception (B feet)    Cervical / Trunk Assessment Cervical / Trunk Assessment: Back Surgery  Communication   Communication Communication: No apparent difficulties    Cognition Arousal: Alert Behavior During Therapy: WFL for tasks assessed/performed   PT - Cognitive impairments: No apparent impairments                         Following commands: Intact       Cueing       General Comments      Exercises Total Joint Exercises Ankle Circles/Pumps: AROM, Both, 10 reps   Assessment/Plan    PT Assessment Patient needs continued PT services  PT Problem List Decreased strength;Decreased range of motion;Decreased activity tolerance;Decreased balance;Decreased mobility;Decreased coordination;Pain       PT Treatment Interventions DME instruction;Gait training;Stair training;Functional mobility training;Therapeutic activities;Therapeutic exercise;Balance training;Neuromuscular re-education;Patient/family education;Modalities    PT Goals (Current goals can be found in the Care Plan section)  Acute Rehab PT Goals Patient Stated Goal: to return to walking 3 miles a day, go to the gym, play with grandchildren, sail and swing dance, as well as help take care of parents. PT Goal Formulation: With patient Time For Goal Achievement:  08/13/23 Potential to Achieve Goals: Good    Frequency 7X/week     Co-evaluation               AM-PAC PT "6 Clicks" Mobility  Outcome Measure Help needed turning from your back to your side while in a flat bed without using bedrails?: A Little Help needed moving from lying on your back to sitting on the side of a flat bed without using bedrails?: A Little Help needed moving to and from a bed to a chair (including a wheelchair)?: A Little Help needed standing up from a chair using your arms (e.g., wheelchair or bedside chair)?: A Little Help needed to walk in hospital room?: A Little Help needed climbing 3-5 steps with a railing? : Total 6 Click Score: 16    End of Session Equipment Utilized During Treatment: Gait belt Activity Tolerance: Patient limited by pain Patient left: in chair;with call bell/phone within reach;with family/visitor present;with nursing/sitter in room Nurse Communication: Mobility status;Patient requests pain meds PT Visit Diagnosis: Unsteadiness on feet (R26.81);Other abnormalities of gait and mobility (R26.89);Muscle weakness (generalized) (M62.81);Difficulty in walking, not elsewhere classified (R26.2);Pain Pain - Right/Left: Left Pain - part of body: Hip;Leg    Time: 1610-9604 PT Time Calculation (min) (ACUTE ONLY): 29 min  Charges:   PT Evaluation $PT Eval Low Complexity: 1 Low PT Treatments $Gait Training: 8-22 mins PT General Charges $$ ACUTE PT VISIT: 1 Visit         Johnny Bridge, PT Acute Rehab   Jacqualyn Posey 07/30/2023, 6:40 PM

## 2023-07-30 NOTE — Interval H&P Note (Signed)
 History and Physical Interval Note: The patient understands that she is here today for a left total hip replacement surgery to her significant left hip pain and arthritis.  There has been no acute or interval change in her medical status.  The risks and benefits of surgery have been discussed in detail and informed consent has been obtained.  The left operative hip has been marked.  07/30/2023 10:55 AM  Whitney Lewis  has presented today for surgery, with the diagnosis of osteoarthritis left hip.  The various methods of treatment have been discussed with the patient and family. After consideration of risks, benefits and other options for treatment, the patient has consented to  Procedure(s): LEFT TOTAL HIP ARTHROPLASTY ANTERIOR APPROACH (Left) as a surgical intervention.  The patient's history has been reviewed, patient examined, no change in status, stable for surgery.  I have reviewed the patient's chart and labs.  Questions were answered to the patient's satisfaction.     Kathryne Hitch

## 2023-07-30 NOTE — Anesthesia Procedure Notes (Signed)
 Spinal  Patient location during procedure: OR End time: 07/30/2023 12:12 PM Reason for block: surgical anesthesia Staffing Performed: resident/CRNA  Resident/CRNA: Enriqueta Shutter D, CRNA Performed by: Harsimran Westman, Clinical cytogeneticist D, CRNA Authorized by: Trevor Iha, MD   Preanesthetic Checklist Completed: patient identified, IV checked, site marked, risks and benefits discussed, surgical consent, monitors and equipment checked, pre-op evaluation and timeout performed Spinal Block Patient position: sitting Prep: DuraPrep Patient monitoring: heart rate, continuous pulse ox and blood pressure Approach: midline Location: L3-4 Injection technique: single-shot Needle Needle type: Pencan  Needle gauge: 24 G Needle length: 9 cm Assessment Sensory level: T6 Events: CSF return

## 2023-07-30 NOTE — Transfer of Care (Signed)
 Immediate Anesthesia Transfer of Care Note  Patient: Whitney Lewis  Procedure(s) Performed: LEFT TOTAL HIP ARTHROPLASTY ANTERIOR APPROACH (Left: Hip)  Patient Location: PACU  Anesthesia Type:Spinal  Level of Consciousness: awake and alert   Airway & Oxygen Therapy: Patient Spontanous Breathing and Patient connected to nasal cannula oxygen  Post-op Assessment: Report given to RN and Post -op Vital signs reviewed and stable  Post vital signs: Reviewed and stable  Last Vitals:  Vitals Value Taken Time  BP    Temp    Pulse 47 07/30/23 1406  Resp 15 07/30/23 1406  SpO2 100 % 07/30/23 1406  Vitals shown include unfiled device data.  Last Pain:  Vitals:   07/30/23 1028  TempSrc:   PainSc: 7       Patients Stated Pain Goal: 5 (07/30/23 1028)  Complications: No notable events documented.

## 2023-07-30 NOTE — Op Note (Signed)
 Operative Note  Date of operation: 07/30/2023 Preoperative diagnosis: Left hip primary osteoarthritis Postoperative diagnosis: Same  Procedure: Left direct anterior total hip arthroplasty  Implant Name Type Inv. Item Serial No. Manufacturer Lot No. LRB No. Used Action  CUP SECTOR GRIPTON - [id number] Cup CUP SECTOR GRIPTON  DEPU[employer] [id number] Left 1 Implanted  PINNACLE PLUS 4 NEUTRAL - [id number] Hips PINNACLE PLUS 4 NEUTRAL  DEPUY ORTHOPAEDICS [id number] Left 1 Implanted  STEM FEM ACTIS HIGH SZ3 - [id number] Stem STEM FEM ACTIS HIGH SZ3  DEPUY ORTHOPA[employer] [id number] Left 1 Implanted  SLEEVE CABLE VT - [id number] Orthopedic Implant SLEEVE CABLE VT  STRYKER [employer] [id number] Left 1 Implanted  HEAD FEMORAL 32 CERAMIC - [id number] Hips HEAD FEMORAL 32 CERAMIC  DEPUY ORTHOPA[employer] [id number] Left 1 Implanted   Surgeon: Vanita Panda. Magnus Ivan, MD Assistant: Rexene Edison, PA-C  Anesthesia: Spinal EBL: 150 cc Antibiotics: IV vancomycin and IV Levaquin Complications: None  Indications: The patient is a 63 year old female with worsening and debilitating arthritis involving her left hip.  This is gotten significantly worse over the last few months.  We originally had seen her earlier and diagnosed her with significant hip arthritis.  She has tried and failed all forms of conservative treatment for a while now.  At this point her left hip is daily and it is significant.  It is detrimentally affecting her mobility, her quality of life and her actives of daily living.  We agree with proceeding with hip replacement surgery.  We did discuss the risk of acute blood loss anemia, nerve or vessel injury, fracture, infection, DVT, dislocation, implant failure, leg length differences and wound healing issues.  Of note her left lower extremity is actually slightly longer than the right lower extremity and she knows we should not make her shorter for stability purposes.  She understands that the  goals of our surgery are hopefully decreased pain, improved mobility and improved quality of life.  Procedure description: After informed consent was obtained and the appropriate left hip was marked, the patient was brought to the operating room and set up on the stretcher where spinal anesthesia was obtained.  She was then laid in supine position on stretcher and a Foley catheter was placed.  Traction boots were placed on both her feet and she was placed supine on the Hana fracture table with a perineal post and placed in both legs and inline skeletal traction devices no traction applied.  Her left operative hip and pelvis were then assessed radiographically.  The left hip was prepped and draped in DuraPrep and sterile drapes.  A timeout was called and she was notified of the correct patient the correct left hip.  An incision was then made just inferior and posterior to the ASIS and carried slightly obliquely down the leg.  Dissection was carried down to the tensor fascia lata muscle and the tensor fascia was then divided longitudinally to proceed with a direct tender approach to the hip.  Circumflex vessels were identified and cauterized.  The hip capsule identified and opened up in L-type format finding a very large joint effusion.  Cobra retractors were placed around the medial and lateral femoral neck and a femoral neck cut was made with an oscillating saw just proximal to the lesser trochanter and this cut was completed with an osteotome.  A corkscrew guide was placed in the femoral head and the femoral head was removed its entirety and it definitely had significant cartilage loss.  A bent  Hohmann was placed over the medial acetabular rim and remnants of the acetabular labrum and other debris removed.  Reaming was then initiated from a size 43 reamer and stepwise increments going up to a size 49 reamer with all reamers placed under direct visualization and the last reamer also placed under direct fluoroscopy  in order to obtain the depth and reaming, the inclination and the anteversion.  The real DePuy sector GRIPTION acetabular component size 50 was then placed without difficulty followed by a 32+4 polythene liner.  Attention was then turned to the femur.  With the left leg externally rotated to 120 degrees, extended and adducted, a Mueller retractor was placed medially and a Hohmann tract behind the greater trochanter.  A box cutting osteotome was used to enter the femoral canal and the lateral joint capsule was released.  Broaching was then initiated using the Actis broaching system from a size 0 going to a size 3.  The size 3 felt to be appropriate size so we trialed a standard offset femoral neck and a 32+1 trial hip ball.  The left leg was brought over and up and with traction and internal rotation reduced in the pelvis.  We felt like based on what we are seeing we needed a little bit more offset.  Overall it did feel stable.  We dislocated the hip easily though and that did correlate with the need for tightening the abductors with a high offset stem.  We removed all trial components and then we placed the real Actis femoral component with high offset size 3.  We did notice just a small crack in the calcar which did not propagate but we still felt better putting a single cable around the femoral neck just above the lesser trochanter at the calcar region.  We then placed a 32+1 ceramic head and reduced the left hip into the pelvis.  We are pleased with leg length, offset, range of motion and stability assessed radiographically and clinically.  Postoperatively we will still allow for full weightbearing as tolerated.  The soft tissue was then irrigated with normal saline solution.  The joint capsule was closed with interrupted #1 Ethibond suture followed by #1 Vicryl close the tensor fascia 0 Vicryl was used to goes deep tissue and 2-0 Vicryl was used to close subcutaneous tissue.  The skin was closed with staples.   Aquacel dressing was applied.  The patient was taken to the recovery room in stable condition.  Rexene Edison PA-C did assist during entire case and beginning to end and his assistance was crucial and medically necessary for soft tissue management and retraction, helping guide implant placement and a layered closure of the wound.

## 2023-07-31 DIAGNOSIS — M1612 Unilateral primary osteoarthritis, left hip: Secondary | ICD-10-CM | POA: Diagnosis not present

## 2023-07-31 LAB — CBC
HCT: 33.8 % — ABNORMAL LOW (ref 36.0–46.0)
Hemoglobin: 11.4 g/dL — ABNORMAL LOW (ref 12.0–15.0)
MCH: 31.2 pg (ref 26.0–34.0)
MCHC: 33.7 g/dL (ref 30.0–36.0)
MCV: 92.6 fL (ref 80.0–100.0)
Platelets: 197 10*3/uL (ref 150–400)
RBC: 3.65 MIL/uL — ABNORMAL LOW (ref 3.87–5.11)
RDW: 11.3 % — ABNORMAL LOW (ref 11.5–15.5)
WBC: 10.7 10*3/uL — ABNORMAL HIGH (ref 4.0–10.5)
nRBC: 0 % (ref 0.0–0.2)

## 2023-07-31 LAB — BASIC METABOLIC PANEL
Anion gap: 9 (ref 5–15)
BUN: 11 mg/dL (ref 8–23)
CO2: 21 mmol/L — ABNORMAL LOW (ref 22–32)
Calcium: 8.9 mg/dL (ref 8.9–10.3)
Chloride: 103 mmol/L (ref 98–111)
Creatinine, Ser: 0.57 mg/dL (ref 0.44–1.00)
GFR, Estimated: >60 mL/min (ref >60)
Glucose, Bld: 145 mg/dL — ABNORMAL HIGH (ref 70–99)
Potassium: 4.1 mmol/L (ref 3.5–5.1)
Sodium: 133 mmol/L — ABNORMAL LOW (ref 135–145)

## 2023-07-31 MED ORDER — TIZANIDINE HCL 4 MG PO TABS: 4.0000 mg | ORAL_TABLET | Freq: Four times a day (QID) | ORAL | 0 refills | Status: DC | PRN

## 2023-07-31 MED ORDER — TIZANIDINE HCL 4 MG PO TABS
4.0000 mg | ORAL_TABLET | Freq: Four times a day (QID) | ORAL | Status: DC | PRN
  Administered 2023-07-31 – 2023-08-02 (×8): 4 mg via ORAL
  Filled 2023-07-31 (×8): qty 1

## 2023-07-31 MED ORDER — ASPIRIN 81 MG PO CHEW
81.0000 mg | CHEWABLE_TABLET | Freq: Two times a day (BID) | ORAL | 0 refills | Status: DC
Start: 2023-07-31 — End: 2023-08-12

## 2023-07-31 MED ORDER — OXYCODONE HCL 5 MG PO TABS: 5.0000 mg | ORAL_TABLET | Freq: Four times a day (QID) | ORAL | 0 refills | Status: DC | PRN

## 2023-07-31 NOTE — TOC Transition Note (Signed)
 Transition of Care [facility]) - Discharge Note   Patient Details  Name: Whitney Lewis MRN: [id number] Date of Birth: [birth year]  [facility] CM/SW Contact:  Diona Browner, LCSW Phone Number: 07/31/2023, 2:46 PM   Clinical Narrative:    Pt will d/c home w/ spouse. Plan for therapy is HHPT w/ [facility]. No DME needs. No TOC needs.   Final next level of care: Home w Home Health Services Barriers to Discharge: No Barriers Identified   Patient Goals and CMS Choice Patient states their goals for this hospitalization and ongoing recovery are:: return home   Choice offered to / list presented to : NA [facility] ownership interest in [facility].provided to::  (NA)    Discharge Placement                       Discharge Plan and Services Additional resources added to the After Visit Summary for                    DME Agency: NA                  Social Drivers of Health (SDOH) Interventions SDOH Screenings   Food Insecurity: No Food Insecurity (07/30/2023)  Housing: Low Risk  (07/30/2023)  Transportation Needs: No Transportation Needs (07/30/2023)  Utilities: Not At Risk (07/30/2023)  Social Connections: Socially Integrated (07/30/2023)  Tobacco Use: Low Risk  (07/30/2023)     Readmission Risk Interventions     No data to display

## 2023-07-31 NOTE — Plan of Care (Signed)
  Problem: Education: Goal: Knowledge of General Education information will improve Description: Including pain rating scale, medication(s)/side effects and non-pharmacologic comfort measures Outcome: Progressing   Problem: Clinical Measurements: Goal: Ability to maintain clinical measurements within normal limits will improve Outcome: Progressing   Problem: Safety: Goal: Ability to remain free from injury will improve Outcome: Progressing   Problem: Education: Goal: Knowledge of the prescribed therapeutic regimen will improve Outcome: Progressing   Problem: Pain Management: Goal: Pain level will decrease with appropriate interventions Outcome: Progressing

## 2023-07-31 NOTE — Progress Notes (Addendum)
 Physical Therapy Treatment Patient Details Name: Whitney Lewis MRN: [id number] DOB: [birth year] Today's Date: 07/31/2023   History of Present Illness 63 yo female presents to therapy s/p L THA, anterior approach on 07/30/2023 due to failure of conservative measures. Pt PMH includes but is not limited to: angina, lumbar fusion, ADD, bipolar disorder, HLD, HTN, OSA, R osteomyelitis of foot, GERD, fibromyalgia, seizures, L TKA (2017).    PT Comments  Pt agreeable to working with therapy. She reports better pain control this afternoon compared to morning. All mobility tasks are effortful. Pt is unable to actively advance her L LE during gait-pt uses her UEs and/or therapist assists with moving leg. After taking a few steps, pt reported dizziness and requested to sit down 2* feeling as if she would pass out-BP 89/61. Pt tolerated ROM exercises fairly well. Will continue to follow and progress activity as safely able.  Addendum: Made TOC team aware (thru secure chat-Rolanda) that pt stated she is now planning to d/c to her father's home and will need help making sure HH will be able to come out to that address   If plan is discharge home, recommend the following: A little help with walking and/or transfers;A little help with bathing/dressing/bathroom;Assistance with cooking/housework;Assist for transportation;Help with stairs or ramp for entrance   Can travel by private vehicle        Equipment Recommendations  None recommended by PT    Recommendations for Other Services       Precautions / Restrictions Precautions Precautions: Fall Restrictions Weight Bearing Restrictions Per Provider Order: No Other Position/Activity Restrictions: WBAT     Mobility  Bed Mobility Overal bed mobility: Needs Assistance Bed Mobility: Supine to Sit, Sit to Supine     Supine to sit: HOB elevated, Used rails, Min assist Sit to supine: HOB elevated, Used rails, Min assist   General bed mobility comments:  Increased time. Task remains efforful for pt. Pt unable to mobilize L LE, on her own, even with using gait belt as leg lifter. Cues provided.    Transfers Overall transfer level: Needs assistance Equipment used: Rolling walker (2 wheels) Transfers: Sit to/from Stand Sit to Stand: Min assist           General transfer comment: Cues for safety. Assist to position L LE and to steady pt. Pt pulling up on walker with both hands. Increased time. Pt continues to have flexed posturing. Bed>bsc with RW    Ambulation/Gait Ambulation/Gait assistance: Min assist Gait Distance (Feet): 5 Feet Assistive device: Rolling walker (2 wheels) Gait Pattern/deviations: Step-to pattern, Trunk flexed       General Gait Details: Pt took a few steps before reporting dizziness, nausea-BP 89/61-pt requested to sit down stating she felt as if she would pass out. Pt uanble to adavance L LE during gait-pt uses UE to move leg forward each step. Cues for safety, technique. Followed closely with recliner. Used recliner to transport pt back to bedside.   Stairs             Wheelchair Mobility     Tilt Bed    Modified Rankin (Stroke Patients Only)       Balance Overall balance assessment: Needs assistance         Standing balance support: Bilateral upper extremity supported, During functional activity, Reliant on assistive device for balance Standing balance-Leahy Scale: Poor  Communication Communication Communication: No apparent difficulties  Cognition Arousal: Alert Behavior During Therapy: WFL for tasks assessed/performed   PT - Cognitive impairments: No apparent impairments                         Following commands: Intact      Cueing    Exercises Total Joint Exercises Ankle Circles/Pumps: AROM, Both, 10 reps Quad Sets: AROM, Both, 10 reps Heel Slides: AAROM, Left, 10 reps Hip ABduction/ADduction: AAROM, Left, 10 reps     General Comments        Pertinent Vitals/Pain Pain Assessment Pain Assessment: 0-10 Pain Score: 5  Pain Location: L hip and LE as well as back Pain Descriptors / Indicators: Aching, Constant, Discomfort, Dull, Grimacing, Operative site guarding, Guarding, Crying Pain Intervention(s): Limited activity within patient's tolerance, Monitored during session, Repositioned    Home Living                          Prior Function            PT Goals (current goals can now be found in the care plan section) Progress towards PT goals: Progressing toward goals    Frequency    7X/week      PT Plan      Co-evaluation              AM-PAC PT "6 Clicks" Mobility   Outcome Measure  Help needed turning from your back to your side while in a flat bed without using bedrails?: A Lot Help needed moving from lying on your back to sitting on the side of a flat bed without using bedrails?: A Lot Help needed moving to and from a bed to a chair (including a wheelchair)?: A Little Help needed standing up from a chair using your arms (e.g., wheelchair or bedside chair)?: A Little Help needed to walk in hospital room?: A Lot Help needed climbing 3-5 steps with a railing? : Total 6 Click Score: 13    End of Session Equipment Utilized During Treatment: Gait belt Activity Tolerance: Patient limited by pain (limited by low BP-pt symptomatic) Patient left: in bed;with call bell/phone within reach;with bed alarm set   PT Visit Diagnosis: Unsteadiness on feet (R26.81);Other abnormalities of gait and mobility (R26.89);Muscle weakness (generalized) (M62.81);Difficulty in walking, not elsewhere classified (R26.2);Pain Pain - Right/Left: Left Pain - part of body: Hip;Leg (back)     Time: 5409-8119 PT Time Calculation (min) (ACUTE ONLY): 40 min  Charges:    $Gait Training: 8-22 mins $Therapeutic Exercise: 8-22 mins $Therapeutic Activity: 8-22 mins PT General Charges $$ ACUTE PT  VISIT: 1 Visit                         Faye Ramsay, PT Acute Rehabilitation  Office: [phone]

## 2023-07-31 NOTE — Progress Notes (Signed)
 Subjective: 1 Day Post-Op Procedure(s) (LRB): LEFT TOTAL HIP ARTHROPLASTY ANTERIOR APPROACH (Left) Patient reports pain as moderate.  Slowed down more this morning from back spasms.  Does have a history of lumbar spine surgery.  Morning PT session did not go well.  Objective: Vital signs in last 24 hours: Temp:  [97.5 F (36.4 C)-98 F (36.7 C)] 98 F (36.7 C) (03/08 0941) Pulse Rate:  [55-78] 78 (03/08 0941) Resp:  [11-18] 16 (03/08 0941) BP: (94-147)/(58-86) 113/58 (03/08 0941) SpO2:  [96 %-100 %] 96 % (03/08 0941)  Intake/Output from previous day: 03/07 0701 - 03/08 0700 In: 3970 [P.O.:660; I.V.:2810; IV Piggyback:500] Out: 2025 [Urine:1875; Blood:150] Intake/Output this shift: Total I/O In: 120 [P.O.:120] Out: -   Recent Labs    07/31/23 0334  HGB 11.4*   Recent Labs    07/31/23 0334  WBC 10.7*  RBC 3.65*  HCT 33.8*  PLT 197   Recent Labs    07/31/23 0334  NA 133*  K 4.1  CL 103  CO2 21*  BUN 11  CREATININE 0.57  GLUCOSE 145*  CALCIUM 8.9   No results for input(s): "LABPT", "INR" in the last 72 hours.  Intact pulses distally Dorsiflexion/Plantar flexion intact Incision: dressing C/D/I   Assessment/Plan: 1 Day Post-Op Procedure(s) (LRB): LEFT TOTAL HIP ARTHROPLASTY ANTERIOR APPROACH (Left) Up with therapy Plan for discharge tomorrow Discharge home with home health      Whitney Lewis 07/31/2023, 10:40 AM

## 2023-07-31 NOTE — Progress Notes (Signed)
 Physical Therapy Treatment Patient Details Name: Whitney Lewis MRN: [id number] DOB: 08-19-1960 Today's Date: 07/31/2023   History of Present Illness 63 yo female presents to therapy s/p L THA, anterior approach on 07/30/2023 due to failure of conservative measures. Pt PMH includes but is not limited to: angina, lumbar fusion, ADD, bipolar disorder, HLD, HTN, OSA, R osteomyelitis of foot, GERD, fibromyalgia, seizures, L TKA (2017).    PT Comments  Pt reports she was awaiting pain meds before therapy. She requested assistance to [facility]. Pain rated 10/10 during session. Pt unable to tolerate ROM exercises or ambulation this session. RN to room to medicate patient. Will progress activity as tolerated this afternoon.     If plan is discharge home, recommend the following: A little help with walking and/or transfers;A little help with bathing/dressing/bathroom;Assistance with cooking/housework;Assist for transportation;Help with stairs or ramp for entrance   Can travel by private vehicle        Equipment Recommendations  None recommended by PT    Recommendations for Other Services       Precautions / Restrictions Precautions Precautions: Fall Restrictions Weight Bearing Restrictions Per Provider Order: No Other Position/Activity Restrictions: WBAT     Mobility  Bed Mobility Overal bed mobility: Needs Assistance Bed Mobility: Supine to Sit, Sit to Supine     Supine to sit: HOB elevated Sit to supine: Min assist, HOB elevated   General bed mobility comments: HOB highly elevated per pt request (note-pt does not have adjustable bed at home). Increased time. Multiple start/stops 2* pain in leg and back. Cues provided.Encouraged pt to try to use gait belt as leg lifter as well    Transfers Overall transfer level: Needs assistance Equipment used: Rolling walker (2 wheels) Transfers: Sit to/from Stand, Bed to chair/wheelchair/BSC Sit to Stand: Min assist           General transfer  comment: Cues for safety. Pt pulling up on walker with both hands. Increased time. Assist to steady. Antalgic steps with flexed trunk, hip, knee posturing during pivot. Bed<>bsc with RW    Ambulation/Gait               General Gait Details: Deferred-pt in too much pain   Stairs             Wheelchair Mobility     Tilt Bed    Modified Rankin (Stroke Patients Only)       Balance Overall balance assessment: Needs assistance         Standing balance support: Bilateral upper extremity supported, During functional activity, Reliant on assistive device for balance Standing balance-Leahy Scale: Poor                              Communication Communication Communication: No apparent difficulties  Cognition Arousal: Alert Behavior During Therapy: WFL for tasks assessed/performed   PT - Cognitive impairments: No apparent impairments                         Following commands: Intact      Cueing    Exercises      General Comments        Pertinent Vitals/Pain Pain Assessment Pain Assessment: 0-10 Pain Score: 10-Worst pain ever Pain Location: L hip and LE as well as back Pain Descriptors / Indicators: Aching, Constant, Discomfort, Dull, Grimacing, Operative site guarding, Guarding, Crying Pain Intervention(s): Limited activity within patient's tolerance, Monitored during  session, RN gave pain meds during session, Ice applied, Repositioned    Home Living                          Prior Function            PT Goals (current goals can now be found in the care plan section) Progress towards PT goals: Not progressing toward goals - comment    Frequency    7X/week      PT Plan      Co-evaluation              AM-PAC PT "6 Clicks" Mobility   Outcome Measure  Help needed turning from your back to your side while in a flat bed without using bedrails?: A Lot Help needed moving from lying on your back to  sitting on the side of a flat bed without using bedrails?: A Lot Help needed moving to and from a bed to a chair (including a wheelchair)?: A Little Help needed standing up from a chair using your arms (e.g., wheelchair or bedside chair)?: A Little Help needed to walk in hospital room?: A Lot Help needed climbing 3-5 steps with a railing? : Total 6 Click Score: 13    End of Session   Activity Tolerance: Patient limited by pain Patient left: in bed;with call bell/phone within reach;with family/visitor present   PT Visit Diagnosis: Unsteadiness on feet (R26.81);Other abnormalities of gait and mobility (R26.89);Muscle weakness (generalized) (M62.81);Difficulty in walking, not elsewhere classified (R26.2);Pain Pain - Right/Left: Left Pain - part of body: Hip;Leg (back)     Time: 1610-9604 PT Time Calculation (min) (ACUTE ONLY): 20 min  Charges:    $Therapeutic Activity: 8-22 mins PT General Charges $$ ACUTE PT VISIT: 1 Visit                         Faye Ramsay, PT Acute Rehabilitation  Office: [phone]

## 2023-07-31 NOTE — Discharge Instructions (Signed)

## 2023-08-01 DIAGNOSIS — M1612 Unilateral primary osteoarthritis, left hip: Secondary | ICD-10-CM | POA: Diagnosis not present

## 2023-08-01 NOTE — Progress Notes (Signed)
  Subjective: Patient stable.  Pain controlled.  Has really only walked inside the room for very short distances.  Patient does report flexion weakness on the left which is a chronic problem but it is interfering with her mobilization.  Patient does have a history of lumbar spine surgery.  Also has a history of leg length inequality prior to this surgery per patient report   Objective: Vital signs in last 24 hours: Temp:  [98 F (36.7 C)-99 F (37.2 C)] 99 F (37.2 C) (03/09 0434) Pulse Rate:  [67-78] 78 (03/09 0434) Resp:  [15-17] 15 (03/09 0434) BP: (89-122)/(57-63) 122/61 (03/09 0434) SpO2:  [96 %-98 %] 98 % (03/09 0434)  Intake/Output from previous day: 03/08 0701 - 03/09 0700 In: 1973.8 [P.O.:800; I.V.:1173.8] Out: 500 [Urine:500] Intake/Output this shift: No intake/output data recorded.  Exam:  Dorsiflexion/Plantar flexion intact Quad fires on the left but hip flexion strength is weak on the left Labs: Recent Labs    07/31/23 0334  HGB 11.4*   Recent Labs    07/31/23 0334  WBC 10.7*  RBC 3.65*  HCT 33.8*  PLT 197   Recent Labs    07/31/23 0334  NA 133*  K 4.1  CL 103  CO2 21*  BUN 11  CREATININE 0.57  GLUCOSE 145*  CALCIUM 8.9   No results for input(s): "LABPT", "INR" in the last 72 hours.  Assessment/Plan: Impression is very slow mobilization following hip replacement.  Does not really have the mobility for discharge at this time.  That may change with physical therapy this morning but tentative plan would be to have her stay tonight and to see if her mobility improves.  She does state that she feels slightly better today than yesterday.   Whitney Lewis Whitney Lewis 08/01/2023, 9:38 AM

## 2023-08-01 NOTE — Progress Notes (Addendum)
 Physical Therapy Treatment Patient Details Name: Whitney Lewis MRN: [id number] DOB: 12-Oct-1960 Today's Date: 08/01/2023   History of Present Illness 63 yo female presents to therapy s/p L THA, anterior approach on 07/30/2023 due to failure of conservative measures. Pt PMH includes but is not limited to: angina, lumbar fusion, ADD, bipolar disorder, HLD, HTN, OSA, R osteomyelitis of foot, GERD, fibromyalgia, seizures, L TKA (2017).    PT Comments  Pt agreeable to working with therapy. She continues to have dizziness and nausea when OOB mobilizing. Pt reports pain as fairly controlled on today. She continues to have difficulty actively moving L LE with all mobility tasks. Will continue to progress activity as safely able. Do not anticipate pt will meet her PT goals on today.     If plan is discharge home, recommend the following: A little help with walking and/or transfers;A little help with bathing/dressing/bathroom;Assistance with cooking/housework;Assist for transportation;Help with stairs or ramp for entrance   Can travel by private vehicle        Equipment Recommendations  None recommended by PT    Recommendations for Other Services       Precautions / Restrictions Precautions Precautions: Fall Restrictions Weight Bearing Restrictions Per Provider Order: No Other Position/Activity Restrictions: WBAT     Mobility  Bed Mobility Overal bed mobility: Needs Assistance Bed Mobility: Supine to Sit     Supine to sit: HOB elevated, Used rails, Min assist     General bed mobility comments: Increased time. Task remains efforful for pt. Assist for L LE needed even with pt using gait belt as leg lifter. Cues provided throughout task.    Transfers Overall transfer level: Needs assistance Equipment used: Rolling walker (2 wheels) Transfers: Sit to/from Stand Sit to Stand: Min assist           General transfer comment: Cues for safety, posture. Assist to position L LE and to  steady pt. Pt pulling up on walker with both hands. Increased time. Pt continues to have flexed posturing.    Ambulation/Gait Ambulation/Gait assistance: Min assist Gait Distance (Feet): 15 Feet Assistive device: Rolling walker (2 wheels) Gait Pattern/deviations: Step-to pattern, Trunk flexed       General Gait Details: Pt took a few steps before reporting dizziness, nausea-pt requested to sit down stating she felt as if she would pass out. Pt still unable to adavance L LE during gait-pt uses UE vs gait belt to move leg forward each step. Cues for safety, technique. Followed closely with recliner. Used recliner to transport pt back to bedside.   Stairs             Wheelchair Mobility     Tilt Bed    Modified Rankin (Stroke Patients Only)       Balance Overall balance assessment: Needs assistance         Standing balance support: Bilateral upper extremity supported, During functional activity, Reliant on assistive device for balance Standing balance-Leahy Scale: Poor                              Communication Communication Communication: No apparent difficulties  Cognition Arousal: Alert Behavior During Therapy: WFL for tasks assessed/performed   PT - Cognitive impairments: No apparent impairments                         Following commands: Intact      Cueing  Exercises Total Joint Exercises Ankle Circles/Pumps: AROM, Both, 10 reps Quad Sets: AROM, Both, 10 reps Short Arc Quad: AAROM, Left, 10 reps Heel Slides: AAROM, Left, 10 reps Hip ABduction/ADduction: AAROM, Left, 10 reps    General Comments        Pertinent Vitals/Pain Pain Assessment Pain Assessment: Faces Faces Pain Scale: Hurts even more Pain Location: L hip and LE as well as back Pain Intervention(s): Limited activity within patient's tolerance, Monitored during session, Ice applied, Repositioned    Home Living                          Prior  Function            PT Goals (current goals can now be found in the care plan section) Progress towards PT goals: Progressing toward goals    Frequency    7X/week      PT Plan      Co-evaluation              AM-PAC PT "6 Clicks" Mobility   Outcome Measure  Help needed turning from your back to your side while in a flat bed without using bedrails?: A Little Help needed moving from lying on your back to sitting on the side of a flat bed without using bedrails?: A Little Help needed moving to and from a bed to a chair (including a wheelchair)?: A Little Help needed standing up from a chair using your arms (e.g., wheelchair or bedside chair)?: A Little Help needed to walk in hospital room?: A Little Help needed climbing 3-5 steps with a railing? : A Lot 6 Click Score: 17    End of Session Equipment Utilized During Treatment: Gait belt Activity Tolerance: Patient limited by fatigue (limited by dizzinss, nausea) Patient left: in chair;with call bell/phone within reach;with family/visitor present   PT Visit Diagnosis: Unsteadiness on feet (R26.81);Other abnormalities of gait and mobility (R26.89);Muscle weakness (generalized) (M62.81);Difficulty in walking, not elsewhere classified (R26.2);Pain Pain - Right/Left: Left Pain - part of body: Hip;Leg     Time: 9147-8295 PT Time Calculation (min) (ACUTE ONLY): 31 min  Charges:    $Gait Training: 8-22 mins $Therapeutic Exercise: 8-22 mins PT General Charges $$ ACUTE PT VISIT: 1 Visit                         Faye Ramsay, PT Acute Rehabilitation  Office: [phone]

## 2023-08-01 NOTE — Progress Notes (Signed)
 Physical Therapy Treatment Patient Details Name: Whitney Lewis MRN: [id number] DOB: 1960-07-30 Today's Date: 08/01/2023   History of Present Illness 63 yo female presents to therapy s/p L THA, anterior approach on 07/30/2023 due to failure of conservative measures. Pt PMH includes but is not limited to: angina, lumbar fusion, ADD, bipolar disorder, HLD, HTN, OSA, R osteomyelitis of foot, GERD, fibromyalgia, seizures, L TKA (2017).    PT Comments  Pt agreeable to working with therapy. Continues to have significant difficulty actively moving L LE with mobility tasks-requiring use of gait belt and at times Min A. Pt denied dizziness this session. Fatigues fairly easily but is motivated to progress. Will continue to progress activity as safely able. Anticipate progress will be slow.     If plan is discharge home, recommend the following: A little help with walking and/or transfers;A little help with bathing/dressing/bathroom;Assistance with cooking/housework;Assist for transportation;Help with stairs or ramp for entrance   Can travel by private vehicle        Equipment Recommendations  None recommended by PT    Recommendations for Other Services       Precautions / Restrictions Precautions Precautions: Fall Restrictions Weight Bearing Restrictions Per Provider Order: No Other Position/Activity Restrictions: WBAT     Mobility  Bed Mobility Overal bed mobility: Needs Assistance Bed Mobility: Supine to Sit     Supine to sit: Min assist, Used rails, HOB elevated Sit to supine: HOB elevated, Used rails, Min assist   General bed mobility comments: Increased time. Task remains efforful for pt. Assist for L LE needed even with pt using gait belt as leg lifter. Cues provided throughout task.    Transfers Overall transfer level: Needs assistance Equipment used: Rolling walker (2 wheels) Transfers: Sit to/from Stand Sit to Stand: Contact guard assist           General transfer  comment: Cues for safety, posture. Increased time.    Ambulation/Gait Ambulation/Gait assistance: Min assist Gait Distance (Feet): 25 Feet Assistive device: Rolling walker (2 wheels) Gait Pattern/deviations: Step-to pattern, Trunk flexed       General Gait Details: Pt still unable to advance L LE during gait-pt uses UE vs gait belt to move leg forward each step. Cues for safety, technique. Followed closely with recliner. Pt denied dizziness this session. Able to ambulate back to room   Stairs             Wheelchair Mobility     Tilt Bed    Modified Rankin (Stroke Patients Only)       Balance Overall balance assessment: Needs assistance         Standing balance support: Bilateral upper extremity supported, During functional activity, Reliant on assistive device for balance Standing balance-Leahy Scale: Poor                              Communication Communication Communication: No apparent difficulties  Cognition Arousal: Alert Behavior During Therapy: WFL for tasks assessed/performed   PT - Cognitive impairments: No apparent impairments                         Following commands: Intact      Cueing    Exercises Total Joint Exercises Ankle Circles/Pumps: AROM, Both, 15 reps Quad Sets: AROM, Both, 10 reps Short Arc Quad: AAROM, Left, 10 reps Heel Slides: AAROM, Left, 10 reps Hip ABduction/ADduction: AAROM, Left, 10 reps  General Comments        Pertinent Vitals/Pain Pain Assessment Pain Assessment: Faces Faces Pain Scale: Hurts even more Pain Location: L hip and LE as well as back Pain Descriptors / Indicators: Aching, Tightness, Operative site guarding, Grimacing Pain Intervention(s): Limited activity within patient's tolerance, Monitored during session, Ice applied, Repositioned    Home Living                          Prior Function            PT Goals (current goals can now be found in the care plan  section) Progress towards PT goals: Progressing toward goals    Frequency    7X/week      PT Plan      Co-evaluation              AM-PAC PT "6 Clicks" Mobility   Outcome Measure  Help needed turning from your back to your side while in a flat bed without using bedrails?: A Little Help needed moving from lying on your back to sitting on the side of a flat bed without using bedrails?: A Little Help needed moving to and from a bed to a chair (including a wheelchair)?: A Little Help needed standing up from a chair using your arms (e.g., wheelchair or bedside chair)?: A Little Help needed to walk in hospital room?: A Little Help needed climbing 3-5 steps with a railing? : A Lot 6 Click Score: 17    End of Session Equipment Utilized During Treatment: Gait belt Activity Tolerance: Patient tolerated treatment well;Patient limited by fatigue Patient left: in bed;with call bell/phone within reach   PT Visit Diagnosis: Unsteadiness on feet (R26.81);Other abnormalities of gait and mobility (R26.89);Muscle weakness (generalized) (M62.81);Difficulty in walking, not elsewhere classified (R26.2);Pain Pain - Right/Left: Left Pain - part of body: Hip (back)     Time: 3244-0102 PT Time Calculation (min) (ACUTE ONLY): 30 min  Charges:    $Gait Training: 8-22 mins $Therapeutic Exercise: 8-22 mins PT General Charges $$ ACUTE PT VISIT: 1 Visit                         Faye Ramsay, PT Acute Rehabilitation  Office: [phone]

## 2023-08-02 ENCOUNTER — Encounter (HOSPITAL_COMMUNITY): Payer: Self-pay | Admitting: Orthopaedic Surgery

## 2023-08-02 DIAGNOSIS — M1612 Unilateral primary osteoarthritis, left hip: Secondary | ICD-10-CM | POA: Diagnosis not present

## 2023-08-02 MED ORDER — SODIUM CHLORIDE 0.9 % IV BOLUS
500.0000 mL | Freq: Once | INTRAVENOUS | Status: AC
Start: 2023-08-02 — End: 2023-08-02
  Administered 2023-08-02: 500 mL via INTRAVENOUS

## 2023-08-02 NOTE — Progress Notes (Addendum)
 Physical Therapy Treatment Patient Details Name: Whitney Lewis MRN: [id number] DOB: [birth year] Today's Date: 08/02/2023   History of Present Illness 63 yo female presents to therapy s/p L THA, anterior approach on 07/30/2023 due to failure of conservative measures. Pt PMH includes but is not limited to: angina, lumbar fusion, ADD, bipolar disorder, HLD, HTN, OSA, R osteomyelitis of foot, GERD, fibromyalgia, seizures, L TKA (2017).    PT Comments  Pt is progressing well this session. She is overall supervision for mobility,able to amb 41'  which is her max distance during stay and ascend/descend single step; pt plans to d/c to her parents home that is relatively handicapped accessible;  pt will likely be ready to d/c home after pm session.   If plan is discharge home, recommend the following:     Can travel by private vehicle        Equipment Recommendations       Recommendations for Other Services       Precautions / Restrictions Precautions Precautions: Fall Restrictions Weight Bearing Restrictions Per Provider Order: No LLE Weight Bearing Per Provider Order: Weight bearing as tolerated     Mobility  Bed Mobility Overal bed mobility: Needs Assistance Bed Mobility: Supine to Sit, Sit to Supine     Supine to sit: Supervision Sit to supine: Supervision   General bed mobility comments: pt able to self assist with leg lifter and incr time    Transfers Overall transfer level: Needs assistance Equipment used: Rolling walker (2 wheels) Transfers: Sit to/from Stand Sit to Stand: Contact guard assist, Supervision           General transfer comment: Cues for safety, posture. Increased time.    Ambulation/Gait Ambulation/Gait assistance: Contact guard assist, Supervision Gait Distance (Feet): 35 Feet Assistive device: Rolling walker (2 wheels) Gait Pattern/deviations: Step-to pattern, Trunk flexed       General Gait Details: Pt still unable to advance L LE during  gait-pt uses gait belt to move leg forward each step. Cues for safety, technique. denies dizziness   Stairs Stairs: Yes Stairs assistance: Min assist Stair Management: No rails, Step to pattern, Backwards, With walker Number of Stairs: 1 General stair comments: cues for sequence and technique. min/guard for safety. huband present for session   Wheelchair Mobility     Tilt Bed    Modified Rankin (Stroke Patients Only)       Balance                                            Communication Communication Communication: No apparent difficulties  Cognition Arousal: Alert Behavior During Therapy: WFL for tasks assessed/performed   PT - Cognitive impairments: No apparent impairments                         Following commands: Intact      Cueing Cueing Techniques: Verbal cues  Exercises Total Joint Exercises Ankle Circles/Pumps: AROM, Both, 15 reps Hip ABduction/ADduction: AAROM, Left, 10 reps    General Comments        Pertinent Vitals/Pain Pain Assessment Pain Assessment: Faces Faces Pain Scale: Hurts little more Pain Location: L hip and LE as well as back Pain Descriptors / Indicators: Aching, Tightness, Operative site guarding, Grimacing Pain Intervention(s): Limited activity within patient's tolerance, Monitored during session, Premedicated before session, Repositioned, Ice applied  Home Living                          Prior Function            PT Goals (current goals can now be found in the care plan section) Acute Rehab PT Goals Patient Stated Goal: to return to walking 3 miles a day, go to the gym, play with grandchildren, sail and swing dance, as well as help take care of parents. PT Goal Formulation: With patient Time For Goal Achievement: 08/13/23 Potential to Achieve Goals: Good Progress towards PT goals: Progressing toward goals    Frequency    7X/week      PT Plan      Co-evaluation               AM-PAC PT "6 Clicks" Mobility   Outcome Measure  Help needed turning from your back to your side while in a flat bed without using bedrails?: None Help needed moving from lying on your back to sitting on the side of a flat bed without using bedrails?: None Help needed moving to and from a bed to a chair (including a wheelchair)?: A Little Help needed standing up from a chair using your arms (e.g., wheelchair or bedside chair)?: A Little Help needed to walk in hospital room?: A Little Help needed climbing 3-5 steps with a railing? : A Little 6 Click Score: 20    End of Session   Activity Tolerance: Patient tolerated treatment well;Patient limited by fatigue Patient left: in bed;with call bell/phone within reach;with family/visitor present   PT Visit Diagnosis: Unsteadiness on feet (R26.81);Other abnormalities of gait and mobility (R26.89);Muscle weakness (generalized) (M62.81);Difficulty in walking, not elsewhere classified (R26.2);Pain Pain - Right/Left: Left Pain - part of body: Hip     Time: 1610-9604 PT Time Calculation (min) (ACUTE ONLY): 20 min  Charges:    $Gait Training: 8-22 mins PT General Charges $$ ACUTE PT VISIT: 1 Visit                     Jennafer Gladue, PT  Acute Rehab Dept [facility]) [phone]  08/02/2023    [facility] 08/02/2023, 11:54 AM

## 2023-08-02 NOTE — Progress Notes (Signed)
 PT TX NOTE  08/02/23 1400  PT Visit Information  Last PT Received On 08/02/23  Assistance Needed Pt continues to progress, meeting goals and is ready to d/c home from PT standpoint with family assisting as needed   History of Present Illness 63 yo female presents to therapy s/p L THA, anterior approach on 07/30/2023 due to failure of conservative measures. Pt PMH includes but is not limited to: angina, lumbar fusion, ADD, bipolar disorder, HLD, HTN, OSA, R osteomyelitis of foot, GERD, fibromyalgia, seizures, L TKA (2017).  Subjective Data  Patient Stated Goal to return to walking 3 miles a day, go to the gym, play with grandchildren, sail and swing dance, as well as help take care of parents.  Precautions  Precautions Fall  Restrictions  Weight Bearing Restrictions Per Provider Order No  LLE Weight Bearing Per Provider Order WBAT  Pain Assessment  Pain Assessment Faces  Faces Pain Scale 4  Pain Location L hip and LE as well as back  Pain Descriptors / Indicators Aching;Tightness;Operative site guarding;Grimacing  Pain Intervention(s) Limited activity within patient's tolerance;Monitored during session;Premedicated before session;Repositioned  Cognition  Arousal Alert  Behavior During Therapy WFL for tasks assessed/performed  PT - Cognitive impairments No apparent impairments  Following Commands  Following commands Intact  Cueing  Cueing Techniques Verbal cues  Communication  Communication No apparent difficulties  Bed Mobility  Overal bed mobility Needs Assistance  Bed Mobility Supine to Sit;Sit to Supine  Supine to sit Supervision  Sit to supine Min assist  General bed mobility comments pt able to self assist with leg lifter and incr time; min assist to lift LLE on to bed d/t pain  Transfers  Overall transfer level Needs assistance  Equipment used Rolling walker (2 wheels)  Transfers Sit to/from Stand;Bed to chair/wheelchair/BSC  Sit to Stand Contact guard assist;Supervision   Bed to/from chair/wheelchair/BSC transfer type: Step pivot  Step pivot transfers Supervision  General transfer comment Cues for safety, posture. Increased time.  Ambulation/Gait  Ambulation/Gait assistance Contact guard assist;Supervision  Gait Distance (Feet) 45 Feet  Assistive device Rolling walker (2 wheels)  Gait Pattern/deviations Step-to pattern;Trunk flexed  General Gait Details Pt still unable to advance L LE during gait-pt uses gait belt to move leg forward each step. Cues for safety, technique. denies dizziness  Stairs assistance Min assist  Stair Management No rails;Step to pattern;Backwards;With walker  General stair comments cues for sequence and technique. min/guard for safety. huband present for session  PT - End of Session  Equipment Utilized During Treatment Gait belt  Activity Tolerance Patient tolerated treatment well;Patient limited by fatigue  Patient left in bed;with call bell/phone within reach;with family/visitor present   PT - Assessment/Plan  PT Visit Diagnosis Unsteadiness on feet (R26.81);Other abnormalities of gait and mobility (R26.89);Muscle weakness (generalized) (M62.81);Difficulty in walking, not elsewhere classified (R26.2);Pain  Pain - Right/Left Left  Pain - part of body Hip  PT Frequency (ACUTE ONLY) 7X/week  AM-PAC PT "6 Clicks" Mobility Outcome Measure (Version 2)  Help needed turning from your back to your side while in a flat bed without using bedrails? 4  Help needed moving from lying on your back to sitting on the side of a flat bed without using bedrails? 4  Help needed moving to and from a bed to a chair (including a wheelchair)? 3  Help needed standing up from a chair using your arms (e.g., wheelchair or bedside chair)? 3  Help needed to walk in hospital room? 3  Help needed  climbing 3-5 steps with a railing?  3  6 Click Score 20  Consider Recommendation of Discharge To: Home with no services  PT Goal Progression  Progress towards PT  goals Progressing toward goals  Acute Rehab PT Goals  PT Goal Formulation With patient  Time For Goal Achievement 08/13/23  Potential to Achieve Goals Good  PT Time Calculation  PT Start Time (ACUTE ONLY) 1404  PT Stop Time (ACUTE ONLY) 1425  PT Time Calculation (min) (ACUTE ONLY) 21 min  PT General Charges  $$ ACUTE PT VISIT 1 Visit  PT Treatments  $Gait Training 8-22 mins

## 2023-08-02 NOTE — Progress Notes (Signed)
 Discharge paperwork printed and reviewed with the pt. Pt verbalized understanding and no concerns made. Pt made aware to pick up medications at [facility] pharmacy. Belongings provided and sister provided transportation.

## 2023-08-02 NOTE — Discharge Summary (Signed)
 Patient ID: Whitney Lewis MRN: [id number] DOB/AGE: 1960/06/25 63 y.o.  Admit date: 07/30/2023 Discharge date: 08/02/2023  Admission Diagnoses:  Principal Problem:   Unilateral primary osteoarthritis, left hip Active Problems:   Status post total replacement of left hip   Discharge Diagnoses:  Same  Past Medical History:  Diagnosis Date   ADD (attention deficit disorder)    Anxiety    Arthritis    feet, knees (07/04/2015)   Bipolar disorder (HCC)    Complication of anesthesia    hard to wake up, blood pressure drops    Depression    Dysrhythmia    PVCs,   Family history of adverse reaction to anesthesia    "sister hard to wake up also"   Fibromyalgia    GERD (gastroesophageal reflux disease)    Hyperlipidemia    Hypertension    Migraine    "hormonal; stopped when I was 16" (07/04/2015)   OSA (obstructive sleep apnea)    wears mouth piece, no CPAP   Seizures (HCC)     Surgeries: Procedure(s): LEFT TOTAL HIP ARTHROPLASTY ANTERIOR APPROACH on 07/30/2023   Consultants:   Discharged Condition: Improved  Hospital Course: Whitney Lewis is an 63 y.o. female who was admitted 07/30/2023 for operative treatment ofUnilateral primary osteoarthritis, left hip. Patient has severe unremitting pain that affects sleep, daily activities, and work/hobbies. After pre-op clearance the patient was taken to the operating room on 07/30/2023 and underwent  Procedure(s): LEFT TOTAL HIP ARTHROPLASTY ANTERIOR APPROACH.    Patient was given perioperative antibiotics:  Anti-infectives (From admission, onward)    Start     Dose/Rate Route Frequency Ordered Stop   07/31/23 0000  vancomycin (VANCOCIN) IVPB 1000 mg/200 mL premix        1,000 mg 200 mL/hr over 60 Minutes Intravenous Every 12 hours 07/30/23 1555 07/31/23 0043   07/30/23 1015  levofloxacin (LEVAQUIN) IVPB 500 mg        500 mg 100 mL/hr over 60 Minutes Intravenous On call to O.R. 07/30/23 1001 07/30/23 1313   07/30/23 1015   vancomycin (VANCOCIN) IVPB 1000 mg/200 mL premix        1,000 mg 200 mL/hr over 60 Minutes Intravenous On call to O.R. 07/30/23 1001 07/30/23 1233        Patient was given sequential compression devices, early ambulation, and chemoprophylaxis to prevent DVT.  Patient benefited maximally from hospital stay and there were no complications.  Slow progress with PT and hypotension felt to be due to pain medications.   Recent vital signs: Patient Vitals for the past 24 hrs:  BP Temp Temp src Pulse Resp SpO2  08/02/23 0845 (!) 97/58 98 F (36.7 C) -- 68 16 98 %  08/02/23 0742 (!) 80/56 (!) 97.5 F (36.4 C) Oral 68 16 98 %  08/02/23 0537 (!) 127/57 98.2 F (36.8 C) Oral 80 17 97 %  08/01/23 2136 101/62 99.2 F (37.3 C) Oral 85 18 99 %  08/01/23 1320 (!) 106/49 98 F (36.7 C) Oral 78 16 99 %     Recent laboratory studies:  Recent Labs    07/31/23 0334  WBC 10.7*  HGB 11.4*  HCT 33.8*  PLT 197  NA 133*  K 4.1  CL 103  CO2 21*  BUN 11  CREATININE 0.57  GLUCOSE 145*  CALCIUM 8.9     Discharge Medications:   Allergies as of 08/02/2023       Reactions   Cefazolin Rash   Quick onset after  2nd dose of cefazolin, full body rash, no mucosal involvement   Acyclovir Other (See Comments)   Diclofenac Sodium Other (See Comments)   Voltaren [diclofenac Sodium] Other (See Comments)   Codeine Nausea And Vomiting   Oxycodone-acetaminophen Nausea And Vomiting        Medication List     TAKE these medications    Aleve 220 MG tablet Generic drug: naproxen sodium as needed (pain).   amphetamine-dextroamphetamine 30 MG tablet Commonly known as: ADDERALL Take 30 mg by mouth 2 (two) times daily.   aspirin 81 MG chewable tablet Chew 1 tablet (81 mg total) by mouth 2 (two) times daily.   cetirizine 10 MG tablet Commonly known as: ZYRTEC Take 10 mg by mouth daily.   chlorhexidine 4 % external liquid Commonly known as: HIBICLENS Apply 15 mLs (1 Application total) topically  as directed for 30 doses. Use as directed daily for 5 days every other week for 6 weeks.   cycloSPORINE 0.05 % ophthalmic emulsion Commonly known as: RESTASIS Place 1 drop into both eyes 2 (two) times daily.   multivitamin with minerals Tabs tablet Take 1 tablet by mouth daily.   mupirocin ointment 2 % Commonly known as: BACTROBAN Place 1 Application into the nose 2 (two) times daily for 60 doses. Use as directed 2 times daily for 5 days every other week for 6 weeks.   oxyCODONE 5 MG immediate release tablet Commonly known as: Oxy IR/ROXICODONE Take 1-2 tablets (5-10 mg total) by mouth every 6 (six) hours as needed for moderate pain (pain score 4-6) (pain score 4-6).   PRESERVISION AREDS 2 PO Take 1 capsule by mouth in the morning and at bedtime.   Ritalin 20 MG tablet Generic drug: methylphenidate 2 (two) times daily with breakfast and lunch.   rosuvastatin 20 MG tablet Commonly known as: CRESTOR Take 20 mg by mouth at bedtime.   sodium chloride 0.65 % Soln nasal spray Commonly known as: OCEAN Place 1 spray into both nostrils as needed for congestion.   tiZANidine 4 MG tablet Commonly known as: ZANAFLEX Take 1 tablet (4 mg total) by mouth every 6 (six) hours as needed for muscle spasms.   valsartan-hydrochlorothiazide 160-12.5 MG tablet Commonly known as: DIOVAN-HCT Take 1 tablet by mouth daily.   zolpidem 10 MG tablet Commonly known as: AMBIEN Take 5-10 mg by mouth at bedtime as needed for sleep.               Durable Medical Equipment  (From admission, onward)           Start     Ordered   07/30/23 1556  DME 3 n 1  Once        07/30/23 1555   07/30/23 1556  DME Walker rolling  Once       Question Answer Comment  Walker: With 5 Inch Wheels   Patient needs a walker to treat with the following condition Status post total replacement of left hip      07/30/23 1555            Diagnostic Studies: DG Pelvis Portable Result Date:  07/30/2023 CLINICAL DATA:  Status post left hip replacement. EXAM: PORTABLE PELVIS 1-2 VIEWS COMPARISON:  None Available. FINDINGS: Left hip arthroplasty in expected alignment. Cerclage wire about the femoral stem. No periprosthetic lucency or fracture. Recent postsurgical change includes air and edema in the soft tissues. Lateral skin staples in place. IMPRESSION: Left hip arthroplasty without immediate postoperative complication. Electronically Signed  By: Narda Rutherford M.D.   On: 07/30/2023 16:02   DG HIP UNILAT WITH PELVIS 1V LEFT Result Date: 07/30/2023 CLINICAL DATA:  Total left hip arthroplasty. Intraoperative fluoroscopy. EXAM: DG HIP (WITH OR WITHOUT PELVIS) 1V*L* COMPARISON:  MRI left hip 04/12/2023, pelvis and right hip radiographs 02/23/2023 FINDINGS: Images were performed intraoperatively without the presence of a radiologist. Interval total left hip arthroplasty. Single intertrochanteric cerclage wire. No hardware complication is seen. Total fluoroscopy images: 3 Total fluoroscopy time: 15 seconds Total dose: Radiation Exposure Index (as provided by the fluoroscopic device): 1.74 mGy air Kerma Please see intraoperative findings for further detail. IMPRESSION: Intraoperative fluoroscopy for total left hip arthroplasty. Electronically Signed   By: Neita Garnet M.D.   On: 07/30/2023 14:39   DG C-Arm 1-60 Min-No Report Result Date: 07/30/2023 Fluoroscopy was utilized by the requesting physician.  No radiographic interpretation.   DG C-Arm 1-60 Min-No Report Result Date: 07/30/2023 Fluoroscopy was utilized by the requesting physician.  No radiographic interpretation.    Disposition: Discharge disposition: 06-Home-Health Care Svc          Follow-up Information     Kathryne Hitch, MD Follow up in 2 week(s).   Specialty: Orthopedic Surgery Contact information: [address] [place] [address] [phone]                  Signed: Richardean Canal 08/02/2023,  12:48 PM

## 2023-08-02 NOTE — Progress Notes (Signed)
 0745 am- pt c/o dizziness and fatigue. Vitals collected BP was 80/56, pulse 68, temp 97.5, resp 16, O2 98%. Surgical site intact with no bleeding. MD at bedside, made aware of BP. Bolus 500cc given stat. BP came up to 97/58. Pt stated "feeling a little better". Assisted pt to bedside commode with no c/o dizziness. Pt tolerated movement well. Call bell in reach. Husband at bedside.

## 2023-08-02 NOTE — Progress Notes (Signed)
 Patient ID: Whitney Lewis, female   DOB: [birth year], 63 y.o.   MRN: [id number] Patient overall feeling better wants to discharge to home later today. Has progressed well with PT and preformed stairs. Will discharge to home later today if remains medically stable and remains appropriate from PT standpoint.

## 2023-08-02 NOTE — Progress Notes (Signed)
 Subjective: 3 Days Post-Op Procedure(s) (LRB): LEFT TOTAL HIP ARTHROPLASTY ANTERIOR APPROACH (Left) Patient reports pain as moderate.  Hypotensive this AM.   Objective: Vital signs in last 24 hours: Temp:  [97.5 F (36.4 C)-99.2 F (37.3 C)] 97.5 F (36.4 C) (03/10 0742) Pulse Rate:  [68-85] 68 (03/10 0845) Resp:  [16-18] 16 (03/10 0742) BP: (80-127)/(49-62) 97/58 (03/10 0845) SpO2:  [97 %-99 %] 98 % (03/10 0742)  Intake/Output from previous day: 03/09 0701 - 03/10 0700 In: 1650 [P.O.:1050; I.V.:600] Out: 300 [Urine:300] Intake/Output this shift: No intake/output data recorded.  Recent Labs    07/31/23 0334  HGB 11.4*   Recent Labs    07/31/23 0334  WBC 10.7*  RBC 3.65*  HCT 33.8*  PLT 197   Recent Labs    07/31/23 0334  NA 133*  K 4.1  CL 103  CO2 21*  BUN 11  CREATININE 0.57  GLUCOSE 145*  CALCIUM 8.9   No results for input(s): "LABPT", "INR" in the last 72 hours.  Dorsiflexion/Plantar flexion intact Incision: scant drainage Compartment soft   Assessment/Plan: 3 Days Post-Op Procedure(s) (LRB): LEFT TOTAL HIP ARTHROPLASTY ANTERIOR APPROACH (Left) Up with therapy Will give 500 cc bolus.  Check on patient at lunch time.       Whitney Lewis 08/02/2023, 9:33 AM

## 2023-08-03 DIAGNOSIS — Z90710 Acquired absence of both cervix and uterus: Secondary | ICD-10-CM | POA: Diagnosis not present

## 2023-08-03 DIAGNOSIS — Z7982 Long term (current) use of aspirin: Secondary | ICD-10-CM | POA: Diagnosis not present

## 2023-08-03 DIAGNOSIS — I493 Ventricular premature depolarization: Secondary | ICD-10-CM | POA: Diagnosis not present

## 2023-08-03 DIAGNOSIS — F419 Anxiety disorder, unspecified: Secondary | ICD-10-CM | POA: Diagnosis not present

## 2023-08-03 DIAGNOSIS — Z79891 Long term (current) use of opiate analgesic: Secondary | ICD-10-CM | POA: Diagnosis not present

## 2023-08-03 DIAGNOSIS — M797 Fibromyalgia: Secondary | ICD-10-CM | POA: Diagnosis not present

## 2023-08-03 DIAGNOSIS — F319 Bipolar disorder, unspecified: Secondary | ICD-10-CM | POA: Diagnosis not present

## 2023-08-03 DIAGNOSIS — Z96642 Presence of left artificial hip joint: Secondary | ICD-10-CM | POA: Diagnosis not present

## 2023-08-03 DIAGNOSIS — Z96652 Presence of left artificial knee joint: Secondary | ICD-10-CM | POA: Diagnosis not present

## 2023-08-03 DIAGNOSIS — R1084 Generalized abdominal pain: Secondary | ICD-10-CM | POA: Diagnosis not present

## 2023-08-03 DIAGNOSIS — Z981 Arthrodesis status: Secondary | ICD-10-CM | POA: Diagnosis not present

## 2023-08-03 DIAGNOSIS — F988 Other specified behavioral and emotional disorders with onset usually occurring in childhood and adolescence: Secondary | ICD-10-CM | POA: Diagnosis not present

## 2023-08-03 DIAGNOSIS — E785 Hyperlipidemia, unspecified: Secondary | ICD-10-CM | POA: Diagnosis not present

## 2023-08-03 DIAGNOSIS — Z471 Aftercare following joint replacement surgery: Secondary | ICD-10-CM | POA: Diagnosis not present

## 2023-08-03 DIAGNOSIS — G4733 Obstructive sleep apnea (adult) (pediatric): Secondary | ICD-10-CM | POA: Diagnosis not present

## 2023-08-03 DIAGNOSIS — Z9181 History of falling: Secondary | ICD-10-CM | POA: Diagnosis not present

## 2023-08-03 DIAGNOSIS — I10 Essential (primary) hypertension: Secondary | ICD-10-CM | POA: Diagnosis not present

## 2023-08-03 DIAGNOSIS — K219 Gastro-esophageal reflux disease without esophagitis: Secondary | ICD-10-CM | POA: Diagnosis not present

## 2023-08-03 DIAGNOSIS — G43909 Migraine, unspecified, not intractable, without status migrainosus: Secondary | ICD-10-CM | POA: Diagnosis not present

## 2023-08-03 DIAGNOSIS — M150 Primary generalized (osteo)arthritis: Secondary | ICD-10-CM | POA: Diagnosis not present

## 2023-08-09 ENCOUNTER — Encounter: Payer: PPO | Admitting: Orthopaedic Surgery

## 2023-08-12 ENCOUNTER — Ambulatory Visit (INDEPENDENT_AMBULATORY_CARE_PROVIDER_SITE_OTHER): Payer: PPO | Admitting: Orthopaedic Surgery

## 2023-08-12 ENCOUNTER — Encounter: Payer: Self-pay | Admitting: Orthopaedic Surgery

## 2023-08-12 ENCOUNTER — Other Ambulatory Visit: Payer: Self-pay | Admitting: Orthopaedic Surgery

## 2023-08-12 DIAGNOSIS — Z96642 Presence of left artificial hip joint: Secondary | ICD-10-CM

## 2023-08-12 NOTE — Progress Notes (Signed)
 The patient is a 63 year old female who is status post a left total hip replacement 2 weeks ago.  Her postoperative course was complicated by significant constipation.  She is only taking Tylenol for pain.  Home health therapy is asking for more home PT since she had setback due to constipation.  She even had to go to the emergency room unfortunately.  I agree with her having more therapy to work on her balance and coordination and transitioning from walker to cane.  I do want her to go slow with the exercises.  She does have a large hematoma but no seroma.  I did try to aspirate fluid from around her left hip.  The incision itself looks good.  Calf is soft and there is some foot and ankle swelling.  The staples were removed from her incision and Steri-Strips applied.  Her leg lengths appear near equal.  She will continue increase her activities as she tolerates and comfort allows.  She is just taking Tylenol for pain so she can drop from my standpoint.  Will see her back in a month to see how she is doing overall from a mobility standpoint but no x-rays are needed.

## 2023-08-25 DIAGNOSIS — M13841 Other specified arthritis, right hand: Secondary | ICD-10-CM | POA: Diagnosis not present

## 2023-08-25 DIAGNOSIS — M13842 Other specified arthritis, left hand: Secondary | ICD-10-CM | POA: Diagnosis not present

## 2023-08-26 DIAGNOSIS — F3171 Bipolar disorder, in partial remission, most recent episode hypomanic: Secondary | ICD-10-CM | POA: Diagnosis not present

## 2023-09-02 DIAGNOSIS — M1612 Unilateral primary osteoarthritis, left hip: Secondary | ICD-10-CM | POA: Diagnosis not present

## 2023-09-14 DIAGNOSIS — F3171 Bipolar disorder, in partial remission, most recent episode hypomanic: Secondary | ICD-10-CM | POA: Diagnosis not present

## 2023-09-16 DIAGNOSIS — H35033 Hypertensive retinopathy, bilateral: Secondary | ICD-10-CM | POA: Diagnosis not present

## 2023-09-16 DIAGNOSIS — H43813 Vitreous degeneration, bilateral: Secondary | ICD-10-CM | POA: Diagnosis not present

## 2023-09-16 DIAGNOSIS — H34812 Central retinal vein occlusion, left eye, with macular edema: Secondary | ICD-10-CM | POA: Diagnosis not present

## 2023-10-05 DIAGNOSIS — F3171 Bipolar disorder, in partial remission, most recent episode hypomanic: Secondary | ICD-10-CM | POA: Diagnosis not present

## 2023-10-12 ENCOUNTER — Encounter: Payer: Self-pay | Admitting: Orthopaedic Surgery

## 2023-10-12 ENCOUNTER — Other Ambulatory Visit: Payer: Self-pay

## 2023-10-12 DIAGNOSIS — Z96642 Presence of left artificial hip joint: Secondary | ICD-10-CM

## 2023-10-14 ENCOUNTER — Telehealth: Payer: Self-pay | Admitting: *Deleted

## 2023-10-14 NOTE — Telephone Encounter (Signed)
 Copied from CRM [id number]. Topic: Appointments - Appointment Scheduling >> Oct 14, 2023  8:28 AM Annelle Kiel wrote: Patient/patient representative is calling to schedule an appointment. Refer to attachments for appointment information.  >> Oct 14, 2023  2:19 PM Emylou G wrote: Canceled appt.. Patient wants to be scheduled at [facility] not [place].[facility] call patient number on file is good.

## 2023-10-15 NOTE — Telephone Encounter (Signed)
 Attempted to call pt, no answer. LVM

## 2023-10-19 ENCOUNTER — Ambulatory Visit: Admitting: Sports Medicine

## 2023-10-21 DIAGNOSIS — H34812 Central retinal vein occlusion, left eye, with macular edema: Secondary | ICD-10-CM | POA: Diagnosis not present

## 2023-10-27 DIAGNOSIS — F3161 Bipolar disorder, current episode mixed, mild: Secondary | ICD-10-CM | POA: Diagnosis not present

## 2023-11-01 ENCOUNTER — Ambulatory Visit: Admitting: Physical Therapy

## 2023-11-01 ENCOUNTER — Encounter: Payer: Self-pay | Admitting: Physical Therapy

## 2023-11-01 DIAGNOSIS — M25551 Pain in right hip: Secondary | ICD-10-CM

## 2023-11-01 DIAGNOSIS — M5459 Other low back pain: Secondary | ICD-10-CM | POA: Diagnosis not present

## 2023-11-01 DIAGNOSIS — M25552 Pain in left hip: Secondary | ICD-10-CM

## 2023-11-01 NOTE — Therapy (Signed)
 OUTPATIENT PHYSICAL THERAPY EVALUATION   Patient Name: Whitney Lewis MRN: [id number] DOB:21-Dec-1960, 63 y.o., female Today's Date: 11/01/2023   PT End of Session - 11/01/23 1318     Visit Number 1    Number of Visits 16    Date for PT Re-Evaluation 12/27/23    Authorization Type HTA    PT Start Time 1150    PT Stop Time 1230    PT Time Calculation (min) 40 min    Activity Tolerance Patient tolerated treatment well    Behavior During Therapy WFL for tasks assessed/performed              Past Medical History:  Diagnosis Date   ADD (attention deficit disorder)    Anxiety    Arthritis    feet, knees (07/04/2015)   Bipolar disorder (HCC)    Complication of anesthesia    hard to wake up, blood pressure drops    Depression    Dysrhythmia    PVCs,   Family history of adverse reaction to anesthesia    "sister hard to wake up also"   Fibromyalgia    GERD (gastroesophageal reflux disease)    Hyperlipidemia    Hypertension    Migraine    "hormonal; stopped when I was 16" (07/04/2015)   OSA (obstructive sleep apnea)    wears mouth piece, no CPAP   Seizures (HCC)    Past Surgical History:  Procedure Laterality Date   ABDOMINAL HYSTERECTOMY  ~ 1993   CESAREAN SECTION  1987   COLONOSCOPY     COLONOSCOPY WITH PROPOFOL  N/A 09/21/2017   Procedure: COLONOSCOPY WITH PROPOFOL ;  Surgeon: Deveron Fly, MD;  Location: [facility];  Service: Endoscopy;  Laterality: N/A;   EYE SURGERY     gets injections in left retina, 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;  Service: Orthopedics;  Laterality: Left;   INCISION AND DRAINAGE OF WOUND Right 07/04/2015   Procedure: IRRIGATION AND DEBRIDEMENT OF RIGHT FOOT DEEP ABSCESS; EXCISION OF RIGHT SECOND METATARSAL HEAD; REMOVAL OF DEEP IMPLANT RIGHT FOOT;  EXCISION OF RIGHT SECOND TOE PROXIMAL PHALANX BASE; INSERTION OF ANTIBIOTIC BEADS INTO RIGHT FOOT ABSCESS;  Surgeon: Amada Backer, MD;  Location: [facility] OR;  Service: Orthopedics;  Laterality: Right;  SITE ALSO  RIGHT FOOT   IRRIGATION AND DEBRIDEMENT FOOT Right 07/04/2015   Irrigation and excisional debridement of right foot deep abscess;; Insertion of antibiotic beads into the right foot abscess   JOINT REPLACEMENT     MAXIMUM ACCESS (MAS)POSTERIOR LUMBAR INTERBODY FUSION (PLIF) 1 LEVEL N/A 01/01/2017   Procedure: L4-5 MAXIMUM ACCESS (MAS) POSTERIOR LUMBAR INTERBODY FUSION (PLIF);  Surgeon: Isadora Mar, MD;  Location: [facility];  Service: Neurosurgery;  Laterality: N/A;   TOTAL HIP ARTHROPLASTY Left 07/30/2023   Procedure: LEFT TOTAL HIP ARTHROPLASTY ANTERIOR APPROACH;  Surgeon: Arnie Lao, MD;  Location: [facility];  Service: Orthopedics;  Laterality: Left;   TOTAL KNEE ARTHROPLASTY Left    WEIL OSTEOTOMY Left 06/06/2015   Procedure: LEFT TWO AND THREE METATARSAL WEIL OSTEOTOMY ;  Surgeon: Amada Backer, MD;  Location: [facility];  Service: Orthopedics;  Laterality: Left;   Patient Active Problem List   Diagnosis Date Noted   Status post total replacement of left hip 07/30/2023   Atypical chest  pain 07/27/2023   Nonspecific abnormal electrocardiogram (ECG) (EKG) 07/27/2023   Chronic left hip pain 04/13/2023   Metatarsal fracture 09/11/2021   S/P lumbar spinal fusion 01/01/2017   ADD (attention deficit disorder) 07/17/2015   Bipolar affective disorder (HCC) 07/17/2015   HLD (hyperlipidemia) 07/17/2015   BP (high blood pressure) 07/17/2015   Obstructive apnea 07/17/2015   Staphylococcus aureus infection    Foot osteomyelitis, right (HCC) 07/04/2015    PCP: Barnetta Liberty, MD  REFERRING PROVIDER: Lucienne Ryder  REFERRING DIAG:  S/P L THA  THERAPY DIAG:  Pain in right hip  Pain in left hip  Other low back pain  ONSET DATE:    SUBJECTIVE:                                                                                                                                                                                            SUBJECTIVE STATEMENT: 11/01/2023  Pt had surgery on 07/30/23  for  L THA. Anterior approach. States longer recovery with compication- fx. Reports doing well initially. L hip doing well, no pain, feelf more "even". Now that she is becoming more active, she is having increased pain. States most pain into bil low back and glutes. She states tightness in bil low back, increased with standing/walking and standing activity, and at end of the day.  She has been able to resume walking up to 30 min/day, walking helps to loosen up. She is only sleeping 3-4 hr/night due to waking up with pain.   She is not wearing heel lift at this time. She had some home health PT, but has not had any PT since then.     PERTINENT HISTORY:  L TKA,  Lumbar fusion L4/5 , previous foot and toe surgeries  PAIN:  Are you having pain? Yes NPRS scale:  4-7-/10 Pain location: bil glutes  and low back Pain description: intermittent ,sore, tight  Aggravating factors:  standing, standing  activity Relieving factors: rest    PRECAUTIONS: None  WEIGHT BEARING RESTRICTIONS No  FALLS:  Has patient fallen in last 6 months? No, Number of falls: 0  OCCUPATION: Retired,   PLOF: Independent  PATIENT GOALS  decreased pain in hips, back, be able to do more activity without pain.     OBJECTIVE:   DIAGNOSTIC FINDINGS:     COGNITION:  Overall cognitive status: Within functional limits for tasks assessed   POSTURE:  Standing: L knee in slight flexion Pelvic alignment much improved from previous visits.     LOWER EXTREMITY ROM:    ROM AROM Eval-    Lumbar: WFL L knee: lacking full flexion  and extension (from previous TKA) ; Limited DF bil;  L hip: mild limitation for flexion, and Er.    LOWER EXTREMITY MMT:   MMT Eval: Knees: 4+/5 Hips: 4/5 on L,   4/+5 on  R   LUMBAR SPECIAL TESTS:  Neg SLR bil;    GAIT: Decreased hip ext on L    TODAY'S TREATMENT   11/01/2023 Therapeutic Exercise: Aerobic:  Supine:   SLR  x 10 with TA;   practice for TA x 5;   bridging 2 x 5;  Seated:  S/L: hip abd x 12   Prone:   hip ext x 10 bil;   quad stretch with strap on L;  Standing:  lumbar ext x 10;  Stretches:  LTR x 10;  review of cat cow ;  Neuromuscular Re-education:  Manual Therapy: prone knee flexion for quad stretch on L;  Therapeutic Activity:  Self Care:    PATIENT EDUCATION:  Education details: updated and reviewed HEP  Person educated: Patient Education method: Explanation, Demonstration, Tactile cues, Verbal cues, and Handouts Education comprehension: verbalized understanding, returned demonstration, verbal cues required, tactile cues required, and needs further education   HOME EXERCISE PROGRAM: Access Code: [email or web address](previous) Access Code: [email or web address]  (previous)  Access Code: [email or web address] URL: [email or web address] Date: 11/01/2023 Prepared by: Terrilee Few  Exercises - Supine Lower Trunk Rotation  - 2 x daily - 10 reps - 5 hold - Quadruped Cat Cow  - 2 x daily - 10 reps - 5 sec hold - Straight Leg Raise  - 1 x daily - 5-6 x weekly - 2 sets - 10 reps - Sidelying Hip Abduction  - 1 x daily - 5-6 x weekly - 1-2 sets - 10 reps - Prone Hip Extension  - 1 x daily - 4-5 x weekly - 1 sets - 10 reps - Supine Bridge  - 1 x daily - 1-2 sets - 10 reps    ASSESSMENT:  CLINICAL IMPRESSION: 11/01/2023 Pt presents with deficits following L THA. She has Stiffness in L hip, with mild decrease in ROM. She has mild strength deficits as well. She has lack of effective HEP for ongoing deficits. She has much tightness and soreness in bil low back into bil glutes with increased standing activity. She does have improved posture and alignment since having THA, since being seen in PT last time. She will benefit from continued work on lumbar and  hip mobility, achieving upright posture for decreasing pain. Pt with decreased ability for full functional activities and sustaining standing/walking activities. Pt to benefit from skilled PT to improve deficits and pain .    OBJECTIVE IMPAIRMENTS Abnormal gait, decreased activity tolerance, decreased balance, decreased knowledge of use of DME, decreased mobility, difficulty walking, decreased ROM, decreased strength, increased muscle spasms, impaired flexibility, improper body mechanics, and pain.   ACTIVITY LIMITATIONS meal prep, cleaning, laundry, shopping, community activity, and yard work.   PERSONAL FACTORS Past/current experiences /foot surgeries, L hip stiffness, leg length discrepancy, are effecting pts outcome.  REHAB POTENTIAL: Good  CLINICAL DECISION MAKING: Stable/uncomplicated  EVALUATION COMPLEXITY: Low    GOALS: Goals reviewed with patient? Yes  SHORT TERM GOALS: Target date: 11/22/2023   Patient to be independent with initial HEP  Goal status: INITIAL    LONG TERM GOALS: Target date: 12/27/2023   Patient to be independent with final HEP  Goal status: INITIAL  2.  Patient to demo improved ROM of L hip and back , to be wfl to improve ability for  more upright posture and gait.   Goal status: INITIAL  3.  Patient to demo improved strength of  bil hips to 4+5,  to improve stability and pain  Goal status: INITIAL  4.  Pt to report ability for sustaining at least 1 hours of standing/walking activity with pain 0-2/10, to improve ability for community activity.  Goal status: INITIAL   PLAN: PT FREQUENCY: 1-2x/week  PT DURATION: 8 weeks  PLANNED INTERVENTIONS: Therapeutic exercises, Therapeutic activity, Neuromuscular re-education, Balance training, Gait training, Patient/Family education, Self Care, Joint mobilization, Joint manipulation, Stair training, Orthotic/Fit training, DME instructions, Dry Needling, Electrical stimulation, Spinal mobilization,  Moist heat, Taping, Traction, Ultrasound, Ionotophoresis 4mg /ml Dexamethasone , Manual therapy, and Neuro Muscular re-education  PLAN FOR NEXT SESSION:  standing posture, lumbar ext, hip ext, glute and hip strength, walking pattern, hip mobility   Terrilee Few, PT, DPT 1:18 PM  11/01/23

## 2023-11-04 DIAGNOSIS — M13841 Other specified arthritis, right hand: Secondary | ICD-10-CM | POA: Diagnosis not present

## 2023-11-04 DIAGNOSIS — M13842 Other specified arthritis, left hand: Secondary | ICD-10-CM | POA: Diagnosis not present

## 2023-11-04 DIAGNOSIS — G5603 Carpal tunnel syndrome, bilateral upper limbs: Secondary | ICD-10-CM | POA: Diagnosis not present

## 2023-11-09 DIAGNOSIS — G4700 Insomnia, unspecified: Secondary | ICD-10-CM | POA: Diagnosis not present

## 2023-11-09 DIAGNOSIS — F3181 Bipolar II disorder: Secondary | ICD-10-CM | POA: Diagnosis not present

## 2023-11-09 DIAGNOSIS — F4321 Adjustment disorder with depressed mood: Secondary | ICD-10-CM | POA: Diagnosis not present

## 2023-11-09 DIAGNOSIS — F900 Attention-deficit hyperactivity disorder, predominantly inattentive type: Secondary | ICD-10-CM | POA: Diagnosis not present

## 2023-11-17 DIAGNOSIS — F3161 Bipolar disorder, current episode mixed, mild: Secondary | ICD-10-CM | POA: Diagnosis not present

## 2023-11-29 DIAGNOSIS — L578 Other skin changes due to chronic exposure to nonionizing radiation: Secondary | ICD-10-CM | POA: Diagnosis not present

## 2023-11-29 DIAGNOSIS — D229 Melanocytic nevi, unspecified: Secondary | ICD-10-CM | POA: Diagnosis not present

## 2023-11-29 DIAGNOSIS — D1800 Hemangioma unspecified site: Secondary | ICD-10-CM | POA: Diagnosis not present

## 2023-11-29 DIAGNOSIS — Z86018 Personal history of other benign neoplasm: Secondary | ICD-10-CM | POA: Diagnosis not present

## 2023-11-29 DIAGNOSIS — L820 Inflamed seborrheic keratosis: Secondary | ICD-10-CM | POA: Diagnosis not present

## 2023-11-29 DIAGNOSIS — D220 Melanocytic nevi of lip: Secondary | ICD-10-CM | POA: Diagnosis not present

## 2023-11-29 DIAGNOSIS — L821 Other seborrheic keratosis: Secondary | ICD-10-CM | POA: Diagnosis not present

## 2023-11-29 DIAGNOSIS — L814 Other melanin hyperpigmentation: Secondary | ICD-10-CM | POA: Diagnosis not present

## 2023-12-01 DIAGNOSIS — H34812 Central retinal vein occlusion, left eye, with macular edema: Secondary | ICD-10-CM | POA: Diagnosis not present

## 2023-12-01 DIAGNOSIS — H35033 Hypertensive retinopathy, bilateral: Secondary | ICD-10-CM | POA: Diagnosis not present

## 2023-12-01 DIAGNOSIS — H2513 Age-related nuclear cataract, bilateral: Secondary | ICD-10-CM | POA: Diagnosis not present

## 2023-12-01 DIAGNOSIS — H43813 Vitreous degeneration, bilateral: Secondary | ICD-10-CM | POA: Diagnosis not present

## 2023-12-01 DIAGNOSIS — D3132 Benign neoplasm of left choroid: Secondary | ICD-10-CM | POA: Diagnosis not present

## 2023-12-06 ENCOUNTER — Ambulatory Visit: Admitting: Physical Therapy

## 2023-12-06 ENCOUNTER — Encounter: Payer: Self-pay | Admitting: Physical Therapy

## 2023-12-06 DIAGNOSIS — M25551 Pain in right hip: Secondary | ICD-10-CM

## 2023-12-06 DIAGNOSIS — M25552 Pain in left hip: Secondary | ICD-10-CM

## 2023-12-06 DIAGNOSIS — M5459 Other low back pain: Secondary | ICD-10-CM | POA: Diagnosis not present

## 2023-12-06 NOTE — Therapy (Signed)
 OUTPATIENT PHYSICAL THERAPY  TREATMENT   Patient Name: Whitney  TIERRIA Lewis MRN: [id number] DOB:[birth year], 63 y.o., female Today's Date: 12/06/2023   PT End of Session - 12/06/23 1107     Visit Number 2    Number of Visits 16    Date for PT Re-Evaluation 12/27/23    Authorization Type HTA    PT Start Time 1107    PT Stop Time 1146    PT Time Calculation (min) 39 min    Activity Tolerance Patient tolerated treatment well    Behavior During Therapy WFL for tasks assessed/performed           Past Medical History:  Diagnosis Date   ADD (attention deficit disorder)    Anxiety    Arthritis    feet, knees (07/04/2015)   Bipolar disorder (HCC)    Complication of anesthesia    hard to wake up, blood pressure drops    Depression    Dysrhythmia    PVCs,   Family history of adverse reaction to anesthesia    sister hard to wake up also   Fibromyalgia    GERD (gastroesophageal reflux disease)    Hyperlipidemia    Hypertension    Migraine    hormonal; stopped when I was 16 (07/04/2015)   OSA (obstructive sleep apnea)    wears mouth piece, no CPAP   Seizures (HCC)    Past Surgical History:  Procedure Laterality Date   ABDOMINAL HYSTERECTOMY  ~ 1993   CESAREAN SECTION  1987   COLONOSCOPY     COLONOSCOPY WITH PROPOFOL  N/A 09/21/2017   Procedure: COLONOSCOPY WITH PROPOFOL ;  Surgeon: Gaylyn Gladis PENNER, MD;  Location: [facility] ENDOSCOPY;  Service: Endoscopy;  Laterality: N/A;   EYE SURGERY     gets injections in left retina, 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   HAMMER TOE SURGERY Right 03/2015   HAMMER TOE SURGERY Left 06/06/2015   Procedure: LEFT TWO-THREE  HAMMER TOE CORRECTION ;  Surgeon: Norleen Armor, MD;  Location: [facility];  Service: Orthopedics;  Laterality: Left;   INCISION AND DRAINAGE OF WOUND Right 07/04/2015   Procedure: IRRIGATION AND DEBRIDEMENT OF RIGHT FOOT DEEP ABSCESS; EXCISION OF RIGHT SECOND METATARSAL HEAD; REMOVAL OF DEEP IMPLANT RIGHT FOOT;  EXCISION OF RIGHT SECOND TOE PROXIMAL PHALANX BASE; INSERTION OF ANTIBIOTIC BEADS INTO RIGHT FOOT ABSCESS;  Surgeon: Norleen Armor, MD;  Location: [facility] OR;  Service: Orthopedics;  Laterality: Right;  SITE ALSO  RIGHT FOOT   IRRIGATION AND DEBRIDEMENT FOOT Right 07/04/2015   Irrigation and excisional debridement of right foot deep abscess;; Insertion of antibiotic beads into the right foot abscess   JOINT REPLACEMENT     MAXIMUM ACCESS (MAS)POSTERIOR LUMBAR INTERBODY FUSION (PLIF) 1 LEVEL N/A 01/01/2017   Procedure: L4-5 MAXIMUM ACCESS (MAS) POSTERIOR LUMBAR INTERBODY FUSION (PLIF);  Surgeon: Joshua Alm RAMAN, MD;  Location: [facility] OR;  Service: Neurosurgery;  Laterality: N/A;   TOTAL HIP ARTHROPLASTY Left 07/30/2023   Procedure: LEFT TOTAL HIP ARTHROPLASTY ANTERIOR APPROACH;  Surgeon: Vernetta Lonni GRADE, MD;  Location: [facility];  Service: Orthopedics;  Laterality: Left;   TOTAL KNEE ARTHROPLASTY Left    WEIL OSTEOTOMY Left 06/06/2015   Procedure: LEFT TWO AND THREE METATARSAL WEIL OSTEOTOMY ;  Surgeon: Norleen Armor, MD;  Location: [facility];  Service: Orthopedics;  Laterality: Left;   Patient Active Problem List   Diagnosis Date Noted   Status post total replacement of left hip 07/30/2023   Atypical chest pain 07/27/2023  Nonspecific abnormal electrocardiogram (ECG) (EKG) 07/27/2023   Chronic left hip pain 04/13/2023   Metatarsal fracture 09/11/2021   S/P lumbar spinal fusion 01/01/2017   ADD (attention deficit disorder) 07/17/2015   Bipolar affective disorder (HCC) 07/17/2015   HLD (hyperlipidemia) 07/17/2015   BP (high blood pressure) 07/17/2015   Obstructive apnea 07/17/2015   Staphylococcus aureus infection    Foot osteomyelitis, right (HCC) 07/04/2015    PCP: Larnell Hamilton, MD  REFERRING PROVIDER: Vernetta  REFERRING DIAG:  S/P L THA  THERAPY DIAG:  Pain in right hip  Pain in left hip  Other low back pain  ONSET DATE:    SUBJECTIVE:                                                                                                                                                                                            SUBJECTIVE STATEMENT:  12/06/2023  Pt has been away for a couple weeks, has been exercising, and continuing to walk up to 4 mi/day. Feels good with activity and with walking. Notes most pain continues to be at night, waking her up around 3 am, in low back Bilaterally, and into bil glutes.   Pt had surgery on 07/30/23  for  L THA. Anterior approach. States longer recovery with compication- fx. Reports doing well initially. L hip doing well, no pain, feelf more even. Now that she is becoming more active, she is having increased pain. States most pain into bil low back and glutes. She states tightness in bil low back, increased with standing/walking and standing activity, and at end of the day.  She has been able to resume walking up to 30 min/day, walking helps to loosen up. She is only sleeping 3-4 hr/night due to waking up with pain.   She is not wearing heel lift at this time. She had some home health PT, but has not had any PT since then.     PERTINENT HISTORY:  L TKA,  Lumbar fusion L4/5 , previous foot and toe surgeries  PAIN:  Are you having pain? Yes NPRS scale:  4-7-/10 Pain location: bil glutes  and low back Pain description: intermittent ,sore, tight  Aggravating factors:  standing, standing  activity Relieving factors: rest    PRECAUTIONS: None  WEIGHT BEARING RESTRICTIONS No  FALLS:  Has patient fallen in last 6 months? No, Number of falls: 0  OCCUPATION: Retired,   PLOF: Independent  PATIENT GOALS  decreased pain in hips, back, be able to do more activity without pain.     OBJECTIVE:   DIAGNOSTIC FINDINGS:     COGNITION:  Overall cognitive  status: Within functional limits for tasks assessed   POSTURE:  Standing: L knee in slight flexion Pelvic alignment much improved from previous visits.     LOWER  EXTREMITY ROM:    ROM AROM Eval-    Lumbar: WFL L knee: lacking full flexion and extension (from previous TKA) ; Limited DF bil;  L hip: mild limitation for flexion, and Er.    LOWER EXTREMITY MMT:   MMT Eval: Knees: 4+/5 Hips: 4/5 on L,   4/+5 on R   LUMBAR SPECIAL TESTS:  Neg SLR bil;    GAIT: Decreased hip ext on L    TODAY'S TREATMENT   12/06/2023 Therapeutic Exercise: Aerobic:  Supine:   Seated:  S/L:  Prone:  manual quad stretch on L;    Standing:  lumbar ext x 10;  hip abd 2 x 10 bil;  education on standing posture for back and glutes;  Stretches:  LTR x 10;   Neuromuscular Re-education:  Manual Therapy: prone knee flexion for quad stretch on L;  Therapeutic Activity: squats x 12;  Step ups fwd 6 in with opp knee drive x 10 bil;  lateral step ups x 10 bil- cueing for hip hinge and back posture;  Self Care:     Therapeutic Exercise: Aerobic:  Supine:   SLR  x 10 with TA;   practice for TA x 5;   bridging 2 x 5;  Seated:  S/L: hip abd x 12   Prone:   hip ext x 10 bil;   quad stretch with strap on L;  Standing:  lumbar ext x 10;  Stretches:  LTR x 10;  review of cat cow ;  Neuromuscular Re-education:  Manual Therapy: prone knee flexion for quad stretch on L;  Therapeutic Activity:  Self Care:    PATIENT EDUCATION:  Education details: updated and reviewed HEP  Person educated: Patient Education method: Explanation, Demonstration, Tactile cues, Verbal cues, and Handouts Education comprehension: verbalized understanding, returned demonstration, verbal cues required, tactile cues required, and needs further education   HOME EXERCISE PROGRAM: Access Code: [email or web address](previous) Access Code: [email or web address]  (previous)  Access Code: [email or web address] URL: [email or web address] Date: 11/01/2023 Prepared by: Tinnie Don  Exercises - Supine Lower Trunk Rotation  - 2 x daily - 10 reps - 5 hold - Quadruped Cat Cow  - 2 x daily - 10 reps - 5 sec hold -  Straight Leg Raise  - 1 x daily - 5-6 x weekly - 2 sets - 10 reps - Sidelying Hip Abduction  - 1 x daily - 5-6 x weekly - 1-2 sets - 10 reps - Prone Hip Extension  - 1 x daily - 4-5 x weekly - 1 sets - 10 reps - Supine Bridge  - 1 x daily - 1-2 sets - 10 reps    ASSESSMENT:  CLINICAL IMPRESSION: 12/06/2023  Pt with good tolerance for activities and strengthening. She has no pain to palpate in glutes today, but states much pain in this region and low back in night time. Reviewed HEP for frequent mobility of back and hips. She will benefit from progressive strength for hips and core, and manual as needed for pain.   Eval: Pt presents with deficits following L THA. She has Stiffness in L hip, with mild decrease in ROM. She has mild strength deficits as well. She has lack of effective HEP for ongoing deficits. She has much tightness and soreness in bil low back into bil glutes with increased standing activity. She  does have improved posture and alignment since having THA, since being seen in PT last time. She will benefit from continued work on lumbar and hip mobility, achieving upright posture for decreasing pain. Pt with decreased ability for full functional activities and sustaining standing/walking activities. Pt to benefit from skilled PT to improve deficits and pain .    OBJECTIVE IMPAIRMENTS Abnormal gait, decreased activity tolerance, decreased balance, decreased knowledge of use of DME, decreased mobility, difficulty walking, decreased ROM, decreased strength, increased muscle spasms, impaired flexibility, improper body mechanics, and pain.   ACTIVITY LIMITATIONS meal prep, cleaning, laundry, shopping, community activity, and yard work.   PERSONAL FACTORS Past/current experiences /foot surgeries, L hip stiffness, leg length discrepancy, are effecting pts outcome.  REHAB POTENTIAL: Good  CLINICAL DECISION MAKING: Stable/uncomplicated  EVALUATION COMPLEXITY: Low    GOALS: Goals  reviewed with patient? Yes  SHORT TERM GOALS: Target date: 11/22/2023   Patient to be independent with initial HEP  Goal status: INITIAL    LONG TERM GOALS: Target date: 12/27/2023   Patient to be independent with final HEP  Goal status: INITIAL  2.  Patient to demo improved ROM of L hip and back , to be wfl to improve ability for more upright posture and gait.   Goal status: INITIAL  3.  Patient to demo improved strength of  bil hips to 4+5,  to improve stability and pain  Goal status: INITIAL  4.  Pt to report ability for sustaining at least 1 hours of standing/walking activity with pain 0-2/10, to improve ability for community activity.  Goal status: INITIAL   PLAN: PT FREQUENCY: 1-2x/week  PT DURATION: 8 weeks  PLANNED INTERVENTIONS: Therapeutic exercises, Therapeutic activity, Neuromuscular re-education, Balance training, Gait training, Patient/Family education, Self Care, Joint mobilization, Joint manipulation, Stair training, Orthotic/Fit training, DME instructions, Dry Needling, Electrical stimulation, Spinal mobilization, Moist heat, Taping, Traction, Ultrasound, Ionotophoresis 4mg /ml Dexamethasone , Manual therapy, and Neuro Muscular re-education  PLAN FOR NEXT SESSION:   standing posture, lumbar ext, hip ext, glute and hip strength, Dead bug, core strength, SLR, lumbar mobility, possible dn for glutes.   Tinnie Don, PT, DPT 12:34 PM  12/06/23

## 2023-12-08 DIAGNOSIS — F3161 Bipolar disorder, current episode mixed, mild: Secondary | ICD-10-CM | POA: Diagnosis not present

## 2023-12-09 ENCOUNTER — Ambulatory Visit: Admitting: Physical Therapy

## 2023-12-09 DIAGNOSIS — M25551 Pain in right hip: Secondary | ICD-10-CM

## 2023-12-09 DIAGNOSIS — M25552 Pain in left hip: Secondary | ICD-10-CM | POA: Diagnosis not present

## 2023-12-09 NOTE — Therapy (Signed)
 OUTPATIENT PHYSICAL THERAPY  TREATMENT   Patient Name: Whitney  BRUNETTA Lewis MRN: [id number] DOB:May 25, 1961, 63 y.o., female Today's Date: 12/09/2023   PT End of Session - 12/12/23 1003     Visit Number 3    Number of Visits 16    Date for PT Re-Evaluation 12/27/23    Authorization Type HTA    PT Start Time 1105    PT Stop Time 1146    PT Time Calculation (min) 41 min    Activity Tolerance Patient tolerated treatment well    Behavior During Therapy WFL for tasks assessed/performed            Past Medical History:  Diagnosis Date   ADD (attention deficit disorder)    Anxiety    Arthritis    feet, knees (07/04/2015)   Bipolar disorder (HCC)    Complication of anesthesia    hard to wake up, blood pressure drops    Depression    Dysrhythmia    PVCs,   Family history of adverse reaction to anesthesia    sister hard to wake up also   Fibromyalgia    GERD (gastroesophageal reflux disease)    Hyperlipidemia    Hypertension    Migraine    hormonal; stopped when I was 16 (07/04/2015)   OSA (obstructive sleep apnea)    wears mouth piece, no CPAP   Seizures (HCC)    Past Surgical History:  Procedure Laterality Date   ABDOMINAL HYSTERECTOMY  ~ 1993   CESAREAN SECTION  1987   COLONOSCOPY     COLONOSCOPY WITH PROPOFOL  N/A 09/21/2017   Procedure: COLONOSCOPY WITH PROPOFOL ;  Surgeon: Gaylyn Gladis PENNER, MD;  Location: [facility] ENDOSCOPY;  Service: Endoscopy;  Laterality: N/A;   EYE SURGERY     gets injections in left retina, 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   HAMMER TOE SURGERY Right 03/2015   HAMMER TOE SURGERY Left 06/06/2015   Procedure: LEFT TWO-THREE  HAMMER TOE CORRECTION ;  Surgeon: Norleen Armor, MD;  Location: [facility];  Service: Orthopedics;  Laterality: Left;   INCISION AND DRAINAGE OF WOUND Right 07/04/2015   Procedure: IRRIGATION AND DEBRIDEMENT OF RIGHT FOOT DEEP ABSCESS; EXCISION OF RIGHT SECOND METATARSAL HEAD; REMOVAL OF DEEP IMPLANT RIGHT FOOT;  EXCISION OF RIGHT SECOND TOE PROXIMAL PHALANX BASE; INSERTION OF ANTIBIOTIC BEADS INTO RIGHT FOOT ABSCESS;  Surgeon: Norleen Armor, MD;  Location: [facility] OR;  Service: Orthopedics;  Laterality: Right;  SITE ALSO  RIGHT FOOT   IRRIGATION AND DEBRIDEMENT FOOT Right 07/04/2015   Irrigation and excisional debridement of right foot deep abscess;; Insertion of antibiotic beads into the right foot abscess   JOINT REPLACEMENT     MAXIMUM ACCESS (MAS)POSTERIOR LUMBAR INTERBODY FUSION (PLIF) 1 LEVEL N/A 01/01/2017   Procedure: L4-5 MAXIMUM ACCESS (MAS) POSTERIOR LUMBAR INTERBODY FUSION (PLIF);  Surgeon: Joshua Alm RAMAN, MD;  Location: [facility] OR;  Service: Neurosurgery;  Laterality: N/A;   TOTAL HIP ARTHROPLASTY Left 07/30/2023   Procedure: LEFT TOTAL HIP ARTHROPLASTY ANTERIOR APPROACH;  Surgeon: Vernetta Lonni GRADE, MD;  Location: [facility];  Service: Orthopedics;  Laterality: Left;   TOTAL KNEE ARTHROPLASTY Left    WEIL OSTEOTOMY Left 06/06/2015   Procedure: LEFT TWO AND THREE METATARSAL WEIL OSTEOTOMY ;  Surgeon: Norleen Armor, MD;  Location: [facility];  Service: Orthopedics;  Laterality: Left;   Patient Active Problem List   Diagnosis Date Noted   Status post total replacement of left hip 07/30/2023   Atypical chest pain  07/27/2023   Nonspecific abnormal electrocardiogram (ECG) (EKG) 07/27/2023   Chronic left hip pain 04/13/2023   Metatarsal fracture 09/11/2021   S/P lumbar spinal fusion 01/01/2017   ADD (attention deficit disorder) 07/17/2015   Bipolar affective disorder (HCC) 07/17/2015   HLD (hyperlipidemia) 07/17/2015   BP (high blood pressure) 07/17/2015   Obstructive apnea 07/17/2015   Staphylococcus aureus infection    Foot osteomyelitis, right (HCC) 07/04/2015    PCP: Larnell Hamilton, MD  REFERRING PROVIDER: Vernetta  REFERRING DIAG:  S/P L THA  THERAPY DIAG:  Pain in right hip  Pain in left hip  ONSET DATE:    SUBJECTIVE:                                                                                                                                                                                            SUBJECTIVE STATEMENT:  12/12/2023  Pt did exercise in pool a couple days ago, and slept very well/all night that night, did not wake up with pain. Pt states continued soreness in bil low back with increased standing/walking, and feels it mostly at night.   Pt had surgery on 07/30/23  for  L THA. Anterior approach. States longer recovery with compication- fx. Reports doing well initially. L hip doing well, no pain, feelf more even. Now that she is becoming more active, she is having increased pain. States most pain into bil low back and glutes. She states tightness in bil low back, increased with standing/walking and standing activity, and at end of the day.  She has been able to resume walking up to 30 min/day, walking helps to loosen up. She is only sleeping 3-4 hr/night due to waking up with pain.   She is not wearing heel lift at this time. She had some home health PT, but has not had any PT since then.     PERTINENT HISTORY:  L TKA,  Lumbar fusion L4/5 , previous foot and toe surgeries  PAIN:  Are you having pain? Yes NPRS scale:  4-7-/10 Pain location: bil glutes  and low back Pain description: intermittent ,sore, tight  Aggravating factors:  standing, standing  activity Relieving factors: rest    PRECAUTIONS: None  WEIGHT BEARING RESTRICTIONS No  FALLS:  Has patient fallen in last 6 months? No, Number of falls: 0  OCCUPATION: Retired,   PLOF: Independent  PATIENT GOALS  decreased pain in hips, back, be able to do more activity without pain.     OBJECTIVE:   DIAGNOSTIC FINDINGS:     COGNITION:  Overall cognitive status: Within functional limits for tasks assessed   POSTURE:  Standing: L knee in slight flexion Pelvic alignment much improved from previous visits.     LOWER EXTREMITY ROM:    ROM AROM Eval-    Lumbar: WFL L  knee: lacking full flexion and extension (from previous TKA) ; Limited DF bil;  L hip: mild limitation for flexion, and Er.    LOWER EXTREMITY MMT:   MMT Eval: Knees: 4+/5 Hips: 4/5 on L,   4/+5 on R   LUMBAR SPECIAL TESTS:  Neg SLR bil;    GAIT: Decreased hip ext on L    TODAY'S TREATMENT   12/12/2023 Therapeutic Exercise: Aerobic:  bike L1 x 6;  Supine:   bridging x 20; 90/90 heel taps x 15 with TA;  Seated:  S/L:  Prone:   Standing:  lumbar ext x 10;  hip abd and ext  2 x 10 bil;  Rows GTB x 20- cueing for back posture;  Stretches:  LTR x 15;    Neuromuscular Re-education:  Manual Therapy:  LAD bil lumbar;  Therapeutic Activity:   Step ups fwd 6 in with opp knee drive 2 x 10 bil, 89oa hold;  (cueing for back posture)  ; squats x 10;  Self Care:     Therapeutic Exercise: Aerobic:  Supine:   SLR  x 10 with TA;   practice for TA x 5;   bridging 2 x 5;  Seated:  S/L: hip abd x 12   Prone:   hip ext x 10 bil;   quad stretch with strap on L;  Standing:  lumbar ext x 10;  Stretches:  LTR x 10;  review of cat cow ;  Neuromuscular Re-education:  Manual Therapy: prone knee flexion for quad stretch on L;  Therapeutic Activity:  Self Care:    PATIENT EDUCATION:  Education details: updated and reviewed HEP  Person educated: Patient Education method: Explanation, Demonstration, Tactile cues, Verbal cues, and Handouts Education comprehension: verbalized understanding, returned demonstration, verbal cues required, tactile cues required, and needs further education   HOME EXERCISE PROGRAM: Access Code: [email or web address](previous) Access Code: [email or web address]  (previous)  Access Code: [email or web address] URL: [email or web address] Date: 11/01/2023 Prepared by: Tinnie Don  Exercises - Supine Lower Trunk Rotation  - 2 x daily - 10 reps - 5 hold - Quadruped Cat Cow  - 2 x daily - 10 reps - 5 sec hold - Straight Leg Raise  - 1 x daily - 5-6 x weekly - 2 sets - 10 reps -  Sidelying Hip Abduction  - 1 x daily - 5-6 x weekly - 1-2 sets - 10 reps - Prone Hip Extension  - 1 x daily - 4-5 x weekly - 1 sets - 10 reps - Supine Bridge  - 1 x daily - 1-2 sets - 10 reps    ASSESSMENT:  CLINICAL IMPRESSION: 12/09/2023  Pt with good tolerance for activities and strengthening. Challenged with core strength and requires cueing for optimal form with hip strength to reduce tension in low back. Plan to continue lumbar mobility and strength as tolerated.    Eval: Pt presents with deficits following L THA. She has Stiffness in L hip, with mild decrease in ROM. She has mild strength deficits as well. She has lack of effective HEP for ongoing deficits. She has much tightness and soreness in bil low back into bil glutes with increased standing activity. She does have improved posture and alignment since having THA, since being seen in PT last time. She will benefit from continued work on  lumbar and hip mobility, achieving upright posture for decreasing pain. Pt with decreased ability for full functional activities and sustaining standing/walking activities. Pt to benefit from skilled PT to improve deficits and pain .    OBJECTIVE IMPAIRMENTS Abnormal gait, decreased activity tolerance, decreased balance, decreased knowledge of use of DME, decreased mobility, difficulty walking, decreased ROM, decreased strength, increased muscle spasms, impaired flexibility, improper body mechanics, and pain.   ACTIVITY LIMITATIONS meal prep, cleaning, laundry, shopping, community activity, and yard work.   PERSONAL FACTORS Past/current experiences /foot surgeries, L hip stiffness, leg length discrepancy, are effecting pts outcome.  REHAB POTENTIAL: Good  CLINICAL DECISION MAKING: Stable/uncomplicated  EVALUATION COMPLEXITY: Low    GOALS: Goals reviewed with patient? Yes  SHORT TERM GOALS: Target date: 11/22/2023   Patient to be independent with initial HEP  Goal status:  INITIAL    LONG TERM GOALS: Target date: 12/27/2023   Patient to be independent with final HEP  Goal status: INITIAL  2.  Patient to demo improved ROM of L hip and back , to be wfl to improve ability for more upright posture and gait.   Goal status: INITIAL  3.  Patient to demo improved strength of  bil hips to 4+5,  to improve stability and pain  Goal status: INITIAL  4.  Pt to report ability for sustaining at least 1 hours of standing/walking activity with pain 0-2/10, to improve ability for community activity.  Goal status: INITIAL   PLAN: PT FREQUENCY: 1-2x/week  PT DURATION: 8 weeks  PLANNED INTERVENTIONS: Therapeutic exercises, Therapeutic activity, Neuromuscular re-education, Balance training, Gait training, Patient/Family education, Self Care, Joint mobilization, Joint manipulation, Stair training, Orthotic/Fit training, DME instructions, Dry Needling, Electrical stimulation, Spinal mobilization, Moist heat, Taping, Traction, Ultrasound, Ionotophoresis 4mg /ml Dexamethasone , Manual therapy, and Neuro Muscular re-education  PLAN FOR NEXT SESSION:   standing posture, lumbar ext, hip ext, glute and hip strength, Dead bug, core strength, SLR, lumbar mobility, possible dn for glutes.  Psoas   Tinnie Don, PT, DPT 10:04 AM  12/12/23

## 2023-12-12 ENCOUNTER — Encounter: Payer: Self-pay | Admitting: Physical Therapy

## 2023-12-13 ENCOUNTER — Encounter: Payer: Self-pay | Admitting: Physical Therapy

## 2023-12-13 ENCOUNTER — Ambulatory Visit: Admitting: Physical Therapy

## 2023-12-13 DIAGNOSIS — M25551 Pain in right hip: Secondary | ICD-10-CM

## 2023-12-13 DIAGNOSIS — M5459 Other low back pain: Secondary | ICD-10-CM | POA: Diagnosis not present

## 2023-12-13 DIAGNOSIS — M25552 Pain in left hip: Secondary | ICD-10-CM

## 2023-12-13 NOTE — Therapy (Unsigned)
 OUTPATIENT PHYSICAL THERAPY  TREATMENT   Patient Name: Whitney  BLU Lewis MRN: [id number] DOB:26-Feb-1961, 63 y.o., female Today's Date: 12/13/2023   PT End of Session - 12/13/23 1437     Visit Number 4    Number of Visits 16    Date for PT Re-Evaluation 12/27/23    Authorization Type HTA    PT Start Time 1435    PT Stop Time 1515    PT Time Calculation (min) 40 min    Activity Tolerance Patient tolerated treatment well    Behavior During Therapy WFL for tasks assessed/performed            Past Medical History:  Diagnosis Date   ADD (attention deficit disorder)    Anxiety    Arthritis    feet, knees (07/04/2015)   Bipolar disorder (HCC)    Complication of anesthesia    hard to wake up, blood pressure drops    Depression    Dysrhythmia    PVCs,   Family history of adverse reaction to anesthesia    sister hard to wake up also   Fibromyalgia    GERD (gastroesophageal reflux disease)    Hyperlipidemia    Hypertension    Migraine    hormonal; stopped when I was 16 (07/04/2015)   OSA (obstructive sleep apnea)    wears mouth piece, no CPAP   Seizures (HCC)    Past Surgical History:  Procedure Laterality Date   ABDOMINAL HYSTERECTOMY  ~ 1993   CESAREAN SECTION  1987   COLONOSCOPY     COLONOSCOPY WITH PROPOFOL  N/A 09/21/2017   Procedure: COLONOSCOPY WITH PROPOFOL ;  Surgeon: Gaylyn Gladis PENNER, MD;  Location: [facility] ENDOSCOPY;  Service: Endoscopy;  Laterality: N/A;   EYE SURGERY     gets injections in left retina, 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   HAMMER TOE SURGERY Right 03/2015   HAMMER TOE SURGERY Left 06/06/2015   Procedure: LEFT TWO-THREE  HAMMER TOE CORRECTION ;  Surgeon: Norleen Armor, MD;  Location: [facility];  Service: Orthopedics;  Laterality: Left;   INCISION AND DRAINAGE OF WOUND Right 07/04/2015   Procedure: IRRIGATION AND DEBRIDEMENT OF RIGHT FOOT DEEP ABSCESS; EXCISION OF RIGHT SECOND METATARSAL HEAD; REMOVAL OF DEEP IMPLANT RIGHT FOOT;  EXCISION OF RIGHT SECOND TOE PROXIMAL PHALANX BASE; INSERTION OF ANTIBIOTIC BEADS INTO RIGHT FOOT ABSCESS;  Surgeon: Norleen Armor, MD;  Location: [facility] OR;  Service: Orthopedics;  Laterality: Right;  SITE ALSO  RIGHT FOOT   IRRIGATION AND DEBRIDEMENT FOOT Right 07/04/2015   Irrigation and excisional debridement of right foot deep abscess;; Insertion of antibiotic beads into the right foot abscess   JOINT REPLACEMENT     MAXIMUM ACCESS (MAS)POSTERIOR LUMBAR INTERBODY FUSION (PLIF) 1 LEVEL N/A 01/01/2017   Procedure: L4-5 MAXIMUM ACCESS (MAS) POSTERIOR LUMBAR INTERBODY FUSION (PLIF);  Surgeon: Joshua Alm RAMAN, MD;  Location: [facility] OR;  Service: Neurosurgery;  Laterality: N/A;   TOTAL HIP ARTHROPLASTY Left 07/30/2023   Procedure: LEFT TOTAL HIP ARTHROPLASTY ANTERIOR APPROACH;  Surgeon: Vernetta Lonni GRADE, MD;  Location: [facility];  Service: Orthopedics;  Laterality: Left;   TOTAL KNEE ARTHROPLASTY Left    WEIL OSTEOTOMY Left 06/06/2015   Procedure: LEFT TWO AND THREE METATARSAL WEIL OSTEOTOMY ;  Surgeon: Norleen Armor, MD;  Location: [facility];  Service: Orthopedics;  Laterality: Left;   Patient Active Problem List   Diagnosis Date Noted   Status post total replacement of left hip 07/30/2023   Atypical chest pain  07/27/2023   Nonspecific abnormal electrocardiogram (ECG) (EKG) 07/27/2023   Chronic left hip pain 04/13/2023   Metatarsal fracture 09/11/2021   S/P lumbar spinal fusion 01/01/2017   ADD (attention deficit disorder) 07/17/2015   Bipolar affective disorder (HCC) 07/17/2015   HLD (hyperlipidemia) 07/17/2015   BP (high blood pressure) 07/17/2015   Obstructive apnea 07/17/2015   Staphylococcus aureus infection    Foot osteomyelitis, right (HCC) 07/04/2015    PCP: Larnell Hamilton, MD  REFERRING PROVIDER: Vernetta  REFERRING DIAG:  S/P L THA  THERAPY DIAG:  Pain in right hip  Pain in left hip  Other low back pain  ONSET DATE:    SUBJECTIVE:                                                                                                                                                                                            SUBJECTIVE STATEMENT:  12/13/2023  Pt with no new complaints. Stiff today.   Pt had surgery on 07/30/23  for  L THA. Anterior approach. States longer recovery with compication- fx. Reports doing well initially. L hip doing well, no pain, feelf more even. Now that she is becoming more active, she is having increased pain. States most pain into bil low back and glutes. She states tightness in bil low back, increased with standing/walking and standing activity, and at end of the day.  She has been able to resume walking up to 30 min/day, walking helps to loosen up. She is only sleeping 3-4 hr/night due to waking up with pain.   She is not wearing heel lift at this time. She had some home health PT, but has not had any PT since then.     PERTINENT HISTORY:  L TKA,  Lumbar fusion L4/5 , previous foot and toe surgeries  PAIN:  Are you having pain? Yes NPRS scale:  4-7-/10 Pain location: bil glutes  and low back Pain description: intermittent ,sore, tight  Aggravating factors:  standing, standing  activity Relieving factors: rest    PRECAUTIONS: None  WEIGHT BEARING RESTRICTIONS No  FALLS:  Has patient fallen in last 6 months? No, Number of falls: 0  OCCUPATION: Retired,   PLOF: Independent  PATIENT GOALS  decreased pain in hips, back, be able to do more activity without pain.     OBJECTIVE:   DIAGNOSTIC FINDINGS:     COGNITION:  Overall cognitive status: Within functional limits for tasks assessed   POSTURE:  Standing: L knee in slight flexion Pelvic alignment much improved from previous visits.     LOWER EXTREMITY ROM:    ROM AROM Eval-  Lumbar: WFL L knee: lacking full flexion and extension (from previous TKA) ; Limited DF bil;  L hip: mild limitation for flexion, and Er.    LOWER EXTREMITY MMT:   MMT  Eval: Knees: 4+/5 Hips: 4/5 on L,   4/+5 on R   LUMBAR SPECIAL TESTS:  Neg SLR bil;    GAIT: Decreased hip ext on L    TODAY'S TREATMENT   12/13/2023 Therapeutic Exercise: Aerobic:   Supine:   90/90 heel taps x 15 with TA;  SLR with TA x 10 bil;  Seated:  S/L:  Prone:   cat /cow x 15;  Standing:  lumbar ext x 10;  hip abd and ext  2 x 10 bil;  scap squeeze x 20 with cueing for posture  Stretches:  LTR x 15;  hip ER rom x 10;  Neuromuscular Re-education:  Manual Therapy:  LAD bil lumbar;  Therapeutic Activity:   Step ups fwd 6 in with opp knee drive 2 x 10 bil, 89oa hold;  (cueing for back posture)  ; squats x 10;  Self Care:     Therapeutic Exercise: Aerobic:  Supine:   SLR  x 10 with TA;   practice for TA x 5;   bridging 2 x 5;  Seated:  S/L: hip abd x 12   Prone:   hip ext x 10 bil;   quad stretch with strap on L;  Standing:  lumbar ext x 10;  Stretches:  LTR x 10;  review of cat cow ;  Neuromuscular Re-education:  Manual Therapy: prone knee flexion for quad stretch on L;  Therapeutic Activity:  Self Care:    PATIENT EDUCATION:  Education details: updated and reviewed HEP  Person educated: Patient Education method: Explanation, Demonstration, Tactile cues, Verbal cues, and Handouts Education comprehension: verbalized understanding, returned demonstration, verbal cues required, tactile cues required, and needs further education   HOME EXERCISE PROGRAM: Access Code: [email or web address](previous) Access Code: [email or web address]  (previous)  Access Code: [email or web address] URL: [email or web address] Date: 11/01/2023 Prepared by: Tinnie Don  Exercises - Supine Lower Trunk Rotation  - 2 x daily - 10 reps - 5 hold - Quadruped Cat Cow  - 2 x daily - 10 reps - 5 sec hold - Straight Leg Raise  - 1 x daily - 5-6 x weekly - 2 sets - 10 reps - Sidelying Hip Abduction  - 1 x daily - 5-6 x weekly - 1-2 sets - 10 reps - Prone Hip Extension  - 1 x daily - 4-5 x weekly - 1 sets - 10  reps - Supine Bridge  - 1 x daily - 1-2 sets - 10 reps    ASSESSMENT:  CLINICAL IMPRESSION: 12/09/2023  Pt with good tolerance for activities and strengthening. Challenged with core strength and requires cueing for optimal form with hip strength to reduce tension in low back. Plan to continue lumbar mobility and strength as tolerated.    Eval: Pt presents with deficits following L THA. She has Stiffness in L hip, with mild decrease in ROM. She has mild strength deficits as well. She has lack of effective HEP for ongoing deficits. She has much tightness and soreness in bil low back into bil glutes with increased standing activity. She does have improved posture and alignment since having THA, since being seen in PT last time. She will benefit from continued work on lumbar and hip mobility, achieving upright posture for decreasing pain. Pt with decreased ability for full functional activities and sustaining  standing/walking activities. Pt to benefit from skilled PT to improve deficits and pain .    OBJECTIVE IMPAIRMENTS Abnormal gait, decreased activity tolerance, decreased balance, decreased knowledge of use of DME, decreased mobility, difficulty walking, decreased ROM, decreased strength, increased muscle spasms, impaired flexibility, improper body mechanics, and pain.   ACTIVITY LIMITATIONS meal prep, cleaning, laundry, shopping, community activity, and yard work.   PERSONAL FACTORS Past/current experiences /foot surgeries, L hip stiffness, leg length discrepancy, are effecting pts outcome.  REHAB POTENTIAL: Good  CLINICAL DECISION MAKING: Stable/uncomplicated  EVALUATION COMPLEXITY: Low    GOALS: Goals reviewed with patient? Yes  SHORT TERM GOALS: Target date: 11/22/2023   Patient to be independent with initial HEP  Goal status: INITIAL    LONG TERM GOALS: Target date: 12/27/2023   Patient to be independent with final HEP  Goal status: INITIAL  2.  Patient to demo  improved ROM of L hip and back , to be wfl to improve ability for more upright posture and gait.   Goal status: INITIAL  3.  Patient to demo improved strength of  bil hips to 4+5,  to improve stability and pain  Goal status: INITIAL  4.  Pt to report ability for sustaining at least 1 hours of standing/walking activity with pain 0-2/10, to improve ability for community activity.  Goal status: INITIAL   PLAN: PT FREQUENCY: 1-2x/week  PT DURATION: 8 weeks  PLANNED INTERVENTIONS: Therapeutic exercises, Therapeutic activity, Neuromuscular re-education, Balance training, Gait training, Patient/Family education, Self Care, Joint mobilization, Joint manipulation, Stair training, Orthotic/Fit training, DME instructions, Dry Needling, Electrical stimulation, Spinal mobilization, Moist heat, Taping, Traction, Ultrasound, Ionotophoresis 4mg /ml Dexamethasone , Manual therapy, and Neuro Muscular re-education  PLAN FOR NEXT SESSION:   standing posture, lumbar ext, hip ext, glute and hip strength, Dead bug, core strength, SLR, lumbar mobility, possible dn for glutes.  Psoas   Tinnie Don, PT, DPT 2:38 PM  12/13/23

## 2023-12-14 NOTE — Progress Notes (Unsigned)
   LILLETTE Ileana Collet, PhD, LAT, ATC acting as a scribe for Artist Lloyd, MD.  Shaneal  LITTIE Kasparian is a 63 y.o. female who presents to [facility] today for LBP. Pt was previously seen by Dr. Lloyd on 03/25/23 for L hip pain.   L THA on 07/30/23 done by Dr. Vernetta  Today, pt c/o LBP x ***. Pt locates pain to ***  Radiating pain: LE numbness/tingling: LE weakness: Aggravates: Treatments tried:  Dx imaging: 02/23/23 L-spine XR  Pertinent review of systems: ***  Relevant historical information: ***   Exam:  There were no vitals taken for this visit. General: Well Developed, well nourished, and in no acute distress.   MSK: ***    Lab and Radiology Results No results found for this or any previous visit (from the past 72 hours). No results found.     Assessment and Plan: 63 y.o. female with ***   PDMP not reviewed this encounter. No orders of the defined types were placed in this encounter.  No orders of the defined types were placed in this encounter.    Discussed warning signs or symptoms. Please see discharge instructions. Patient expresses understanding.   ***

## 2023-12-15 ENCOUNTER — Ambulatory Visit (INDEPENDENT_AMBULATORY_CARE_PROVIDER_SITE_OTHER)

## 2023-12-15 ENCOUNTER — Encounter: Payer: Self-pay | Admitting: Family Medicine

## 2023-12-15 ENCOUNTER — Ambulatory Visit: Admitting: Family Medicine

## 2023-12-15 VITALS — BP 138/74 | HR 89 | Ht 66.0 in | Wt 145.0 lb

## 2023-12-15 DIAGNOSIS — M545 Low back pain, unspecified: Secondary | ICD-10-CM | POA: Diagnosis not present

## 2023-12-15 DIAGNOSIS — G8929 Other chronic pain: Secondary | ICD-10-CM

## 2023-12-15 DIAGNOSIS — Z96642 Presence of left artificial hip joint: Secondary | ICD-10-CM | POA: Diagnosis not present

## 2023-12-15 DIAGNOSIS — M47816 Spondylosis without myelopathy or radiculopathy, lumbar region: Secondary | ICD-10-CM | POA: Diagnosis not present

## 2023-12-15 DIAGNOSIS — M48061 Spinal stenosis, lumbar region without neurogenic claudication: Secondary | ICD-10-CM | POA: Diagnosis not present

## 2023-12-15 NOTE — Patient Instructions (Addendum)
 Thank you for coming in today.   Please get an Xray today before you leave   We've placed an order for a CT Myelogram of your lower back. [facility] will reach out to assist with scheduling after the procedure has been approved for coverage by your insurance company.   See you back as needed.

## 2023-12-17 DIAGNOSIS — G5603 Carpal tunnel syndrome, bilateral upper limbs: Secondary | ICD-10-CM | POA: Diagnosis not present

## 2023-12-21 ENCOUNTER — Ambulatory Visit: Payer: Self-pay | Admitting: Family Medicine

## 2023-12-21 DIAGNOSIS — M545 Low back pain, unspecified: Secondary | ICD-10-CM

## 2023-12-21 DIAGNOSIS — H34812 Central retinal vein occlusion, left eye, with macular edema: Secondary | ICD-10-CM | POA: Diagnosis not present

## 2023-12-21 NOTE — Progress Notes (Signed)
 Low back x-ray shows prior lumbar fusion at L4-5 without hardware failure or fracture.  You do have some arthritis at L2-3 and L3-4

## 2023-12-23 DIAGNOSIS — G5603 Carpal tunnel syndrome, bilateral upper limbs: Secondary | ICD-10-CM | POA: Diagnosis not present

## 2023-12-24 NOTE — Discharge Instructions (Signed)

## 2023-12-27 ENCOUNTER — Ambulatory Visit
Admission: RE | Admit: 2023-12-27 | Discharge: 2023-12-27 | Disposition: A | Discharge status: Home / Self Care | Source: Ambulatory Visit | Attending: Family Medicine | Admitting: Family Medicine

## 2023-12-27 DIAGNOSIS — G8929 Other chronic pain: Secondary | ICD-10-CM

## 2023-12-27 DIAGNOSIS — M545 Low back pain, unspecified: Secondary | ICD-10-CM | POA: Diagnosis not present

## 2023-12-27 DIAGNOSIS — M48061 Spinal stenosis, lumbar region without neurogenic claudication: Secondary | ICD-10-CM | POA: Diagnosis not present

## 2023-12-27 MED ORDER — ONDANSETRON HCL 4 MG/2ML IJ SOLN: 4.0000 mg | Freq: Once | INTRAMUSCULAR | Status: DC | PRN

## 2023-12-27 MED ORDER — DIAZEPAM 5 MG PO TABS: 10.0000 mg | ORAL_TABLET | Freq: Once | ORAL | Status: DC

## 2023-12-27 MED ORDER — IOPAMIDOL (ISOVUE-M 200) INJECTION 41%
15.0000 mL | Freq: Once | INTRAMUSCULAR | Status: AC
  Administered 2023-12-27: 15 mL via INTRATHECAL

## 2023-12-27 MED ORDER — MEPERIDINE HCL 50 MG/ML IJ SOLN: 50.0000 mg | Freq: Once | INTRAMUSCULAR | Status: DC | PRN

## 2023-12-30 ENCOUNTER — Encounter: Payer: Self-pay | Admitting: Family Medicine

## 2023-12-30 DIAGNOSIS — H34812 Central retinal vein occlusion, left eye, with macular edema: Secondary | ICD-10-CM | POA: Diagnosis not present

## 2024-01-03 ENCOUNTER — Ambulatory Visit: Admitting: Physical Therapy

## 2024-01-03 ENCOUNTER — Encounter: Payer: Self-pay | Admitting: Physical Therapy

## 2024-01-03 DIAGNOSIS — M5459 Other low back pain: Secondary | ICD-10-CM | POA: Diagnosis not present

## 2024-01-03 DIAGNOSIS — M25551 Pain in right hip: Secondary | ICD-10-CM

## 2024-01-03 DIAGNOSIS — M25552 Pain in left hip: Secondary | ICD-10-CM

## 2024-01-03 NOTE — Therapy (Signed)
 OUTPATIENT PHYSICAL THERAPY TREATMENT/Re-Cert    Patient Name: Whitney Lewis MRN: [id number] DOB:[birth year], 63 y.o., female Today's Date: 01/03/2024    PT End of Session - 01/03/24 0855     Visit Number 5    Number of Visits 16    Date for PT Re-Evaluation 02/28/24    Authorization Type HTA    PT Start Time 0850    PT Stop Time 0930    PT Time Calculation (min) 40 min    Activity Tolerance Patient tolerated treatment well    Behavior During Therapy [facility] for tasks assessed/performed            Past Medical History:  Diagnosis Date   ADD (attention deficit disorder)    Anxiety    Arthritis    feet, knees (07/04/2015)   Bipolar disorder (HCC)    Complication of anesthesia    hard to wake up, blood pressure drops    Depression    Dysrhythmia    PVCs,   Family history of adverse reaction to anesthesia    sister hard to wake up also   Fibromyalgia    GERD (gastroesophageal reflux disease)    Hyperlipidemia    Hypertension    Migraine    hormonal; stopped when I was 16 (07/04/2015)   OSA (obstructive sleep apnea)    wears mouth piece, no CPAP   Seizures (HCC)    Past Surgical History:  Procedure Laterality Date   ABDOMINAL HYSTERECTOMY  ~ 1993   CESAREAN SECTION  1987   COLONOSCOPY     COLONOSCOPY WITH PROPOFOL  N/A 09/21/2017   Procedure: COLONOSCOPY WITH PROPOFOL ;  Surgeon: Gaylyn Gladis PENNER, MD;  Location: [facility] ENDOSCOPY;  Service: Endoscopy;  Laterality: N/A;   EYE SURGERY     gets injections in left retina, 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   HAMMER TOE SURGERY Right 03/2015   HAMMER TOE SURGERY Left 06/06/2015   Procedure: LEFT TWO-THREE  HAMMER TOE CORRECTION ;  Surgeon: Norleen Armor, MD;  Location: [facility];  Service: Orthopedics;  Laterality: Left;   INCISION AND DRAINAGE OF WOUND Right 07/04/2015   Procedure: IRRIGATION AND DEBRIDEMENT OF RIGHT FOOT DEEP ABSCESS; EXCISION OF RIGHT SECOND METATARSAL HEAD; REMOVAL OF DEEP IMPLANT  RIGHT FOOT; EXCISION OF RIGHT SECOND TOE PROXIMAL PHALANX BASE; INSERTION OF ANTIBIOTIC BEADS INTO RIGHT FOOT ABSCESS;  Surgeon: Norleen Armor, MD;  Location: [facility] OR;  Service: Orthopedics;  Laterality: Right;  SITE ALSO  RIGHT FOOT   IRRIGATION AND DEBRIDEMENT FOOT Right 07/04/2015   Irrigation and excisional debridement of right foot deep abscess;; Insertion of antibiotic beads into the right foot abscess   JOINT REPLACEMENT     MAXIMUM ACCESS (MAS)POSTERIOR LUMBAR INTERBODY FUSION (PLIF) 1 LEVEL N/A 01/01/2017   Procedure: L4-5 MAXIMUM ACCESS (MAS) POSTERIOR LUMBAR INTERBODY FUSION (PLIF);  Surgeon: Joshua Alm RAMAN, MD;  Location: [facility] OR;  Service: Neurosurgery;  Laterality: N/A;   TOTAL HIP ARTHROPLASTY Left 07/30/2023   Procedure: LEFT TOTAL HIP ARTHROPLASTY ANTERIOR APPROACH;  Surgeon: Vernetta Lonni GRADE, MD;  Location: [facility];  Service: Orthopedics;  Laterality: Left;   TOTAL KNEE ARTHROPLASTY Left    WEIL OSTEOTOMY Left 06/06/2015   Procedure: LEFT TWO AND THREE METATARSAL WEIL OSTEOTOMY ;  Surgeon: Norleen Armor, MD;  Location: [facility];  Service: Orthopedics;  Laterality: Left;   Patient Active Problem List   Diagnosis Date Noted   Status post total replacement of left hip 07/30/2023   Atypical chest  pain 07/27/2023   Nonspecific abnormal electrocardiogram (ECG) (EKG) 07/27/2023   Chronic left hip pain 04/13/2023   Metatarsal fracture 09/11/2021   S/P lumbar spinal fusion 01/01/2017   ADD (attention deficit disorder) 07/17/2015   Bipolar affective disorder (HCC) 07/17/2015   HLD (hyperlipidemia) 07/17/2015   BP (high blood pressure) 07/17/2015   Obstructive apnea 07/17/2015   Staphylococcus aureus infection    Foot osteomyelitis, right (HCC) 07/04/2015    PCP: Larnell Hamilton, MD  REFERRING PROVIDER: Vernetta  REFERRING DIAG:  S/P L THA  THERAPY DIAG:  Pain in right hip  Pain in left hip  Other low back pain  ONSET DATE:    SUBJECTIVE:                                                                                                                                                                                            SUBJECTIVE STATEMENT:  01/03/2024  Variable pain with sleeping. Still very sore with standing, walking feels good. Did have f/u MD appt and CT since last PT visit. Waiting for CT results. Still having bothersome pain in low back into bil glutes.   Pt had surgery on 07/30/23  for  L THA. Anterior approach. States longer recovery with compication- fx. Reports doing well initially. L hip doing well, no pain, feelf more even. Now that she is becoming more active, she is having increased pain. States most pain into bil low back and glutes. She states tightness in bil low back, increased with standing/walking and standing activity, and at end of the day.  She has been able to resume walking up to 30 min/day, walking helps to loosen up. She is only sleeping 3-4 hr/night due to waking up with pain.   She is not wearing heel lift at this time. She had some home health PT, but has not had any PT since then.     PERTINENT HISTORY:  L TKA,  Lumbar fusion L4/5 , previous foot and toe surgeries  PAIN:  Are you having pain? Yes NPRS scale:  4/10 Pain location: bil glutes  and low back Pain description: intermittent ,sore, tight  Aggravating factors:  standing, standing  activity Relieving factors: rest    PRECAUTIONS: None  WEIGHT BEARING RESTRICTIONS No  FALLS:  Has patient fallen in last 6 months? No, Number of falls: 0  OCCUPATION: Retired,   PLOF: Independent  PATIENT GOALS  decreased pain in hips, back, be able to do more activity without pain.     OBJECTIVE:   DIAGNOSTIC FINDINGS:     COGNITION:  Overall cognitive status: Within functional limits for tasks assessed  POSTURE:  Standing: L knee in slight flexion Pelvic alignment much improved from previous visits.     LOWER EXTREMITY ROM:    ROM AROM  Eval-    Lumbar: WFL L knee: lacking full flexion and extension (from previous TKA) ; Limited DF bil;  L hip: wfl   LOWER EXTREMITY MMT:   MMT Eval: Knees: 5/5 Hips: flex: 5/5,  abd: 4+/5 to 5/5.    LUMBAR SPECIAL TESTS:  Neg SLR bil;    GAIT: Decreased hip ext on L    TODAY'S TREATMENT   01/03/2024 Therapeutic Exercise: Aerobic:  bike L2 x 5 min Supine:   Seated:  ball rolls fwd, L, R for lumbar rom.  S/L:  Prone:   cat /cow x 15;  bird dog- LEs only 2 x 5 bil;  Standing:   hip  ext  2 x 10 bil;  Stretches:  fig 4 hip - supine x 2 bil;  Neuromuscular Re-education:  Manual Therapy:  LAD R lumbar;  lumbar PA s,  TPR R lumbar paraspinals Therapeutic Activity:   Step ups fwd 6 in with opp knee drive 2 x 10 bil, 89oa hold;  (cueing for back posture)  ; slow march without UE support x 20;  Self Care:     Therapeutic Exercise: Aerobic:  Supine:   SLR  x 10 with TA;   practice for TA x 5;   bridging 2 x 5;  Seated:  S/L: hip abd x 12   Prone:   hip ext x 10 bil;   quad stretch with strap on L;  Standing:  lumbar ext x 10;  Stretches:  LTR x 10;  review of cat cow ;  Neuromuscular Re-education:  Manual Therapy: prone knee flexion for quad stretch on L;  Therapeutic Activity:  Self Care:    PATIENT EDUCATION:  Education details: updated and reviewed HEP  Person educated: Patient Education method: Explanation, Demonstration, Tactile cues, Verbal cues, and Handouts Education comprehension: verbalized understanding, returned demonstration, verbal cues required, tactile cues required, and needs further education   HOME EXERCISE PROGRAM: Access Code: [email or web address](previous) Access Code: [email or web address]  (previous)  Access Code: [email or web address] URL: [email or web address] Date: 11/01/2023 Prepared by: Tinnie Don  Exercises - Supine Lower Trunk Rotation  - 2 x daily - 10 reps - 5 hold - Quadruped Cat Cow  - 2 x daily - 10 reps - 5 sec hold - Straight Leg Raise  - 1 x  daily - 5-6 x weekly - 2 sets - 10 reps - Sidelying Hip Abduction  - 1 x daily - 5-6 x weekly - 1-2 sets - 10 reps - Prone Hip Extension  - 1 x daily - 4-5 x weekly - 1 sets - 10 reps - Supine Bridge  - 1 x daily - 1-2 sets - 10 reps    ASSESSMENT:  CLINICAL IMPRESSION: 1/88/7974 Re-cert:  Pt has been seen for 5 visits. She reports less pain than initially, but still having bothersome discomfort in back that radiates into bil glutes. No pain to palpate glutes or with hip activity. Hip doing great after surgery, good ROM and very good strength. She is doing well functionally with strengthening, but still having bothersome pain. Discussed trying not to overdo standing activity throughout the day to see if it helps pain. She is doing well with HEP. Pt to benefit from skilled PT to improve pain and deficits, to meet LTGs, and improve sleeping ability with less pain.   Eval: Pt presents  with deficits following L THA. She has Stiffness in L hip, with mild decrease in ROM. She has mild strength deficits as well. She has lack of effective HEP for ongoing deficits. She has much tightness and soreness in bil low back into bil glutes with increased standing activity. She does have improved posture and alignment since having THA, since being seen in PT last time. She will benefit from continued work on lumbar and hip mobility, achieving upright posture for decreasing pain. Pt with decreased ability for full functional activities and sustaining standing/walking activities. Pt to benefit from skilled PT to improve deficits and pain .    OBJECTIVE IMPAIRMENTS Abnormal gait, decreased activity tolerance, decreased balance, decreased knowledge of use of DME, decreased mobility, difficulty walking, decreased ROM, decreased strength, increased muscle spasms, impaired flexibility, improper body mechanics, and pain.   ACTIVITY LIMITATIONS meal prep, cleaning, laundry, shopping, community activity, and yard work.    PERSONAL FACTORS Past/current experiences /foot surgeries, L hip stiffness, leg length discrepancy, are effecting pts outcome.  REHAB POTENTIAL: Good  CLINICAL DECISION MAKING: Stable/uncomplicated  EVALUATION COMPLEXITY: Low    GOALS: Goals reviewed with patient? Yes  SHORT TERM GOALS: Target date: 11/22/2023   Patient to be independent with initial HEP  Goal status: MET    LONG TERM GOALS: Target date: 02/28/2024   Patient to be independent with final HEP  Goal status: In progress   2.  Patient to demo improved ROM of L hip and back , to be wfl to improve ability for more upright posture and gait.   Goal status: In progress   3.  Patient to demo improved strength of  bil hips to 4+5,  to improve stability and pain  Goal status: In progress   4.  Pt to report ability for sustaining at least 1 hours of standing/walking activity with pain 0-2/10, to improve ability for community activity.  Goal status: In progress    PLAN: PT FREQUENCY: 1-2x/week  PT DURATION: 8 weeks  PLANNED INTERVENTIONS: Therapeutic exercises, Therapeutic activity, Neuromuscular re-education, Balance training, Gait training, Patient/Family education, Self Care, Joint mobilization, Joint manipulation, Stair training, Orthotic/Fit training, DME instructions, Dry Needling, Electrical stimulation, Spinal mobilization, Moist heat, Taping, Traction, Ultrasound, Ionotophoresis 4mg /ml Dexamethasone , Manual therapy, and Neuro Muscular re-education  PLAN FOR NEXT SESSION:   standing posture, lumbar ext, hip ext, glute and hip strength, Dead bug, core strength, SLR, lumbar mobility, possible dn for glutes.  Psoas   Tinnie Don, PT, DPT 8:55 AM  01/03/24

## 2024-01-04 DIAGNOSIS — F3161 Bipolar disorder, current episode mixed, mild: Secondary | ICD-10-CM | POA: Diagnosis not present

## 2024-01-13 NOTE — Progress Notes (Signed)
 You have a good fusion at L4-5.  However right above the level of fusion at L3-4 there is severe spinal stenosis and right neuroforaminal stenosis that I think is causing your symptoms.  Based on our conversation about a month ago I think the plan was to refer you to Dr. Bonner at [facility] ortho. Would you like me to go ahead and proceed with that to proceed with injections?

## 2024-01-14 ENCOUNTER — Ambulatory Visit: Admitting: Physical Therapy

## 2024-01-14 ENCOUNTER — Encounter: Payer: Self-pay | Admitting: Physical Therapy

## 2024-01-14 DIAGNOSIS — M25551 Pain in right hip: Secondary | ICD-10-CM

## 2024-01-14 DIAGNOSIS — M25552 Pain in left hip: Secondary | ICD-10-CM

## 2024-01-14 DIAGNOSIS — M5459 Other low back pain: Secondary | ICD-10-CM

## 2024-01-14 NOTE — Therapy (Signed)
 OUTPATIENT PHYSICAL THERAPY TREATMENT   Patient Name: Whitney Lewis MRN: [id number] DOB:1960-10-30, 63 y.o., female Today's Date: 01/14/2024    PT End of Session - 01/14/24 1340     Visit Number 6    Number of Visits 16    Date for PT Re-Evaluation 02/28/24    Authorization Type HTA  re-cert done at visit 5    PT Start Time 1020    PT Stop Time 1100    PT Time Calculation (min) 40 min    Activity Tolerance Patient tolerated treatment well    Behavior During Therapy [facility] for tasks assessed/performed             Past Medical History:  Diagnosis Date   ADD (attention deficit disorder)    Anxiety    Arthritis    feet, knees (07/04/2015)   Bipolar disorder (HCC)    Complication of anesthesia    hard to wake up, blood pressure drops    Depression    Dysrhythmia    PVCs,   Family history of adverse reaction to anesthesia    sister hard to wake up also   Fibromyalgia    GERD (gastroesophageal reflux disease)    Hyperlipidemia    Hypertension    Migraine    hormonal; stopped when I was 16 (07/04/2015)   OSA (obstructive sleep apnea)    wears mouth piece, no CPAP   Seizures (HCC)    Past Surgical History:  Procedure Laterality Date   ABDOMINAL HYSTERECTOMY  ~ 1993   CESAREAN SECTION  1987   COLONOSCOPY     COLONOSCOPY WITH PROPOFOL  N/A 09/21/2017   Procedure: COLONOSCOPY WITH PROPOFOL ;  Surgeon: Gaylyn Gladis PENNER, MD;  Location: [facility] ENDOSCOPY;  Service: Endoscopy;  Laterality: N/A;   EYE SURGERY     gets injections in left retina, 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   HAMMER TOE SURGERY Right 03/2015   HAMMER TOE SURGERY Left 06/06/2015   Procedure: LEFT TWO-THREE  HAMMER TOE CORRECTION ;  Surgeon: Norleen Armor, MD;  Location: [facility];  Service: Orthopedics;  Laterality: Left;   INCISION AND DRAINAGE OF WOUND Right 07/04/2015   Procedure: IRRIGATION AND DEBRIDEMENT OF RIGHT FOOT DEEP ABSCESS; EXCISION OF RIGHT SECOND METATARSAL HEAD; REMOVAL  OF DEEP IMPLANT RIGHT FOOT; EXCISION OF RIGHT SECOND TOE PROXIMAL PHALANX BASE; INSERTION OF ANTIBIOTIC BEADS INTO RIGHT FOOT ABSCESS;  Surgeon: Norleen Armor, MD;  Location: [facility] OR;  Service: Orthopedics;  Laterality: Right;  SITE ALSO  RIGHT FOOT   IRRIGATION AND DEBRIDEMENT FOOT Right 07/04/2015   Irrigation and excisional debridement of right foot deep abscess;; Insertion of antibiotic beads into the right foot abscess   JOINT REPLACEMENT     MAXIMUM ACCESS (MAS)POSTERIOR LUMBAR INTERBODY FUSION (PLIF) 1 LEVEL N/A 01/01/2017   Procedure: L4-5 MAXIMUM ACCESS (MAS) POSTERIOR LUMBAR INTERBODY FUSION (PLIF);  Surgeon: Joshua Alm RAMAN, MD;  Location: [facility] OR;  Service: Neurosurgery;  Laterality: N/A;   TOTAL HIP ARTHROPLASTY Left 07/30/2023   Procedure: LEFT TOTAL HIP ARTHROPLASTY ANTERIOR APPROACH;  Surgeon: Vernetta Lonni GRADE, MD;  Location: [facility];  Service: Orthopedics;  Laterality: Left;   TOTAL KNEE ARTHROPLASTY Left    WEIL OSTEOTOMY Left 06/06/2015   Procedure: LEFT TWO AND THREE METATARSAL WEIL OSTEOTOMY ;  Surgeon: Norleen Armor, MD;  Location: [facility];  Service: Orthopedics;  Laterality: Left;   Patient Active Problem List   Diagnosis Date Noted   Status post total replacement of left  hip 07/30/2023   Atypical chest pain 07/27/2023   Nonspecific abnormal electrocardiogram (ECG) (EKG) 07/27/2023   Chronic left hip pain 04/13/2023   Metatarsal fracture 09/11/2021   S/P lumbar spinal fusion 01/01/2017   ADD (attention deficit disorder) 07/17/2015   Bipolar affective disorder (HCC) 07/17/2015   HLD (hyperlipidemia) 07/17/2015   BP (high blood pressure) 07/17/2015   Obstructive apnea 07/17/2015   Staphylococcus aureus infection    Foot osteomyelitis, right (HCC) 07/04/2015    PCP: Larnell Hamilton, MD  REFERRING PROVIDER: Vernetta  REFERRING DIAG:  S/P L THA  THERAPY DIAG:  Pain in right hip  Pain in left hip  Other low back pain  ONSET DATE:     SUBJECTIVE:                                                                                                                                                                                           SUBJECTIVE STATEMENT:  01/14/2024  Variable pain with sleeping. Still very sore with standing, walking feels good. Did have f/u MD appt and CT since last PT visit. Waiting for CT results. Still having bothersome pain in low back into bil glutes.   Pt had surgery on 07/30/23  for  L THA. Anterior approach. States longer recovery with compication- fx. Reports doing well initially. L hip doing well, no pain, feelf more even. Now that she is becoming more active, she is having increased pain. States most pain into bil low back and glutes. She states tightness in bil low back, increased with standing/walking and standing activity, and at end of the day.  She has been able to resume walking up to 30 min/day, walking helps to loosen up. She is only sleeping 3-4 hr/night due to waking up with pain.   She is not wearing heel lift at this time. She had some home health PT, but has not had any PT since then.     PERTINENT HISTORY:  L TKA,  Lumbar fusion L4/5 , previous foot and toe surgeries  PAIN:  Are you having pain? Yes NPRS scale:  4/10 Pain location: bil glutes  and low back Pain description: intermittent ,sore, tight  Aggravating factors:  standing, standing  activity Relieving factors: rest    PRECAUTIONS: None  WEIGHT BEARING RESTRICTIONS No  FALLS:  Has patient fallen in last 6 months? No, Number of falls: 0  OCCUPATION: Retired,   PLOF: Independent  PATIENT GOALS  decreased pain in hips, back, be able to do more activity without pain.     OBJECTIVE:   DIAGNOSTIC FINDINGS:     COGNITION:  Overall cognitive status:  Within functional limits for tasks assessed   POSTURE:  Standing: L knee in slight flexion Pelvic alignment much improved from previous visits.     LOWER  EXTREMITY ROM:    ROM AROM Eval-    Lumbar: WFL L knee: lacking full flexion and extension (from previous TKA) ; Limited DF bil;  L hip: wfl   LOWER EXTREMITY MMT:   MMT Eval: Knees: 5/5 Hips: flex: 5/5,  abd: 4+/5 to 5/5.    LUMBAR SPECIAL TESTS:  Neg SLR bil;    GAIT: Decreased hip ext on L    TODAY'S TREATMENT   01/14/2024 Therapeutic Exercise: review of HEP for lumbar flexion bias, core and hip strength.  Aerobic:   Supine:  supine march with TA 2 x 10; SKTC x 3 bil;  Seated:   S/L:  Prone:   cat /cow x 15;  bird dog- LEs only 2 x 5 bil;   Standing:   hip  ext  2 x 10 bil;  Stretches:  fig 4 hip - supine x 2 bil;  Neuromuscular Re-education:  Manual Therapy:   Therapeutic Activity:    Self Care: review of current symptoms, presentation, possible aquatic PT, and possible interventions in the future.      Therapeutic Exercise: Aerobic:  bike L2 x 5 min Supine:   Seated:  ball rolls fwd, L, R for lumbar rom.  S/L:  Prone:   cat /cow x 15;  bird dog- LEs only 2 x 5 bil;  Standing:   hip  ext  2 x 10 bil;  Stretches:  fig 4 hip - supine x 2 bil;  Neuromuscular Re-education:  Manual Therapy:  LAD R lumbar;  lumbar PA s,  TPR R lumbar paraspinals Therapeutic Activity:   Step ups fwd 6 in with opp knee drive 2 x 10 bil, 89oa hold;  (cueing for back posture)  ; slow march without UE support x 20;  Self Care:     Therapeutic Exercise: Aerobic:  Supine:   SLR  x 10 with TA;   practice for TA x 5;   bridging 2 x 5;  Seated:  S/L: hip abd x 12   Prone:   hip ext x 10 bil;   quad stretch with strap on L;  Standing:  lumbar ext x 10;  Stretches:  LTR x 10;  review of cat cow ;  Neuromuscular Re-education:  Manual Therapy: prone knee flexion for quad stretch on L;  Therapeutic Activity:  Self Care:    PATIENT EDUCATION:  Education details: updated and reviewed HEP  Person educated: Patient Education method: Explanation, Demonstration, Tactile cues, Verbal  cues, and Handouts Education comprehension: verbalized understanding, returned demonstration, verbal cues required, tactile cues required, and needs further education   HOME EXERCISE PROGRAM: Access Code: [email or web address](previous) Access Code: [email or web address]  (previous)  Access Code: [email or web address] URL: [email or web address] Date: 11/01/2023 Prepared by: Tinnie Don  Exercises - Supine Lower Trunk Rotation  - 2 x daily - 10 reps - 5 hold - Quadruped Cat Cow  - 2 x daily - 10 reps - 5 sec hold - Straight Leg Raise  - 1 x daily - 5-6 x weekly - 2 sets - 10 reps - Sidelying Hip Abduction  - 1 x daily - 5-6 x weekly - 1-2 sets - 10 reps - Prone Hip Extension  - 1 x daily - 4-5 x weekly - 1 sets - 10 reps - Supine Bridge  - 1 x daily - 1-2 sets -  10 reps    ASSESSMENT:  CLINICAL IMPRESSION: 01/14/2024 Pt with new results from recent CT. She will be seeing different dr for possible lumbar injection for pain relief. Discussed continued mobility and best exercises to continue at this time. Also reviewed option for aquatic PT to educate on options for unweighting and HEP in aquatic environment for decreasing pain.   Eval: Pt presents with deficits following L THA. She has Stiffness in L hip, with mild decrease in ROM. She has mild strength deficits as well. She has lack of effective HEP for ongoing deficits. She has much tightness and soreness in bil low back into bil glutes with increased standing activity. She does have improved posture and alignment since having THA, since being seen in PT last time. She will benefit from continued work on lumbar and hip mobility, achieving upright posture for decreasing pain. Pt with decreased ability for full functional activities and sustaining standing/walking activities. Pt to benefit from skilled PT to improve deficits and pain .    OBJECTIVE IMPAIRMENTS Abnormal gait, decreased activity tolerance, decreased balance, decreased knowledge of use of DME,  decreased mobility, difficulty walking, decreased ROM, decreased strength, increased muscle spasms, impaired flexibility, improper body mechanics, and pain.   ACTIVITY LIMITATIONS meal prep, cleaning, laundry, shopping, community activity, and yard work.   PERSONAL FACTORS Past/current experiences /foot surgeries, L hip stiffness, leg length discrepancy, are effecting pts outcome.  REHAB POTENTIAL: Good  CLINICAL DECISION MAKING: Stable/uncomplicated  EVALUATION COMPLEXITY: Low    GOALS: Goals reviewed with patient? Yes  SHORT TERM GOALS: Target date: 11/22/2023   Patient to be independent with initial HEP  Goal status: MET    LONG TERM GOALS: Target date: 02/28/2024   Patient to be independent with final HEP  Goal status: In progress   2.  Patient to demo improved ROM of L hip and back , to be wfl to improve ability for more upright posture and gait.   Goal status: In progress   3.  Patient to demo improved strength of  bil hips to 4+5,  to improve stability and pain  Goal status: In progress   4.  Pt to report ability for sustaining at least 1 hours of standing/walking activity with pain 0-2/10, to improve ability for community activity.  Goal status: In progress    PLAN: PT FREQUENCY: 1-2x/week  PT DURATION: 8 weeks  PLANNED INTERVENTIONS: Therapeutic exercises, Therapeutic activity, Neuromuscular re-education, Balance training, Gait training, Patient/Family education, Self Care, Joint mobilization, Joint manipulation, Stair training, Orthotic/Fit training, DME instructions, Dry Needling, Electrical stimulation, Spinal mobilization, Moist heat, Taping, Traction, Ultrasound, Ionotophoresis 4mg /ml Dexamethasone , Manual therapy, and Neuro Muscular re-education  PLAN FOR NEXT SESSION:     Tinnie Don, PT, DPT 1:53 PM  01/14/24

## 2024-01-20 ENCOUNTER — Encounter: Payer: Self-pay | Admitting: Physical Therapy

## 2024-01-20 ENCOUNTER — Ambulatory Visit: Payer: Self-pay | Admitting: Physical Therapy

## 2024-01-20 ENCOUNTER — Ambulatory Visit: Admitting: Family Medicine

## 2024-01-20 DIAGNOSIS — M25552 Pain in left hip: Secondary | ICD-10-CM | POA: Diagnosis not present

## 2024-01-20 DIAGNOSIS — M5459 Other low back pain: Secondary | ICD-10-CM | POA: Diagnosis not present

## 2024-01-20 DIAGNOSIS — M25551 Pain in right hip: Secondary | ICD-10-CM

## 2024-01-20 NOTE — Therapy (Signed)
 OUTPATIENT PHYSICAL THERAPY TREATMENT   Patient Name: Whitney Lewis MRN: [id number] DOB:08-02-1960, 63 y.o., female Today's Date: 01/20/2024    PT End of Session - 01/20/24 1019     Visit Number 7    Number of Visits 16    Date for PT Re-Evaluation 02/28/24    Authorization Type HTA  re-cert done at visit 5    PT Start Time 1020    PT Stop Time 1100    PT Time Calculation (min) 40 min    Activity Tolerance Patient tolerated treatment well    Behavior During Therapy [facility] for tasks assessed/performed             Past Medical History:  Diagnosis Date   ADD (attention deficit disorder)    Anxiety    Arthritis    feet, knees (07/04/2015)   Bipolar disorder (HCC)    Complication of anesthesia    hard to wake up, blood pressure drops    Depression    Dysrhythmia    PVCs,   Family history of adverse reaction to anesthesia    sister hard to wake up also   Fibromyalgia    GERD (gastroesophageal reflux disease)    Hyperlipidemia    Hypertension    Migraine    hormonal; stopped when I was 16 (07/04/2015)   OSA (obstructive sleep apnea)    wears mouth piece, no CPAP   Seizures (HCC)    Past Surgical History:  Procedure Laterality Date   ABDOMINAL HYSTERECTOMY  ~ 1993   CESAREAN SECTION  1987   COLONOSCOPY     COLONOSCOPY WITH PROPOFOL  N/A 09/21/2017   Procedure: COLONOSCOPY WITH PROPOFOL ;  Surgeon: Gaylyn Gladis PENNER, MD;  Location: [facility] ENDOSCOPY;  Service: Endoscopy;  Laterality: N/A;   EYE SURGERY     gets injections in left retina, 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   HAMMER TOE SURGERY Right 03/2015   HAMMER TOE SURGERY Left 06/06/2015   Procedure: LEFT TWO-THREE  HAMMER TOE CORRECTION ;  Surgeon: Norleen Armor, MD;  Location: [facility];  Service: Orthopedics;  Laterality: Left;   INCISION AND DRAINAGE OF WOUND Right 07/04/2015   Procedure: IRRIGATION AND DEBRIDEMENT OF RIGHT FOOT DEEP ABSCESS; EXCISION OF RIGHT SECOND METATARSAL HEAD; REMOVAL  OF DEEP IMPLANT RIGHT FOOT; EXCISION OF RIGHT SECOND TOE PROXIMAL PHALANX BASE; INSERTION OF ANTIBIOTIC BEADS INTO RIGHT FOOT ABSCESS;  Surgeon: Norleen Armor, MD;  Location: [facility] OR;  Service: Orthopedics;  Laterality: Right;  SITE ALSO  RIGHT FOOT   IRRIGATION AND DEBRIDEMENT FOOT Right 07/04/2015   Irrigation and excisional debridement of right foot deep abscess;; Insertion of antibiotic beads into the right foot abscess   JOINT REPLACEMENT     MAXIMUM ACCESS (MAS)POSTERIOR LUMBAR INTERBODY FUSION (PLIF) 1 LEVEL N/A 01/01/2017   Procedure: L4-5 MAXIMUM ACCESS (MAS) POSTERIOR LUMBAR INTERBODY FUSION (PLIF);  Surgeon: Joshua Alm RAMAN, MD;  Location: [facility] OR;  Service: Neurosurgery;  Laterality: N/A;   TOTAL HIP ARTHROPLASTY Left 07/30/2023   Procedure: LEFT TOTAL HIP ARTHROPLASTY ANTERIOR APPROACH;  Surgeon: Vernetta Lonni GRADE, MD;  Location: [facility];  Service: Orthopedics;  Laterality: Left;   TOTAL KNEE ARTHROPLASTY Left    WEIL OSTEOTOMY Left 06/06/2015   Procedure: LEFT TWO AND THREE METATARSAL WEIL OSTEOTOMY ;  Surgeon: Norleen Armor, MD;  Location: [facility];  Service: Orthopedics;  Laterality: Left;   Patient Active Problem List   Diagnosis Date Noted   Status post total replacement of left  hip 07/30/2023   Atypical chest pain 07/27/2023   Nonspecific abnormal electrocardiogram (ECG) (EKG) 07/27/2023   Chronic left hip pain 04/13/2023   Metatarsal fracture 09/11/2021   S/P lumbar spinal fusion 01/01/2017   ADD (attention deficit disorder) 07/17/2015   Bipolar affective disorder (HCC) 07/17/2015   HLD (hyperlipidemia) 07/17/2015   BP (high blood pressure) 07/17/2015   Obstructive apnea 07/17/2015   Staphylococcus aureus infection    Foot osteomyelitis, right (HCC) 07/04/2015    PCP: Larnell Hamilton, MD  REFERRING PROVIDER: Vernetta  REFERRING DIAG:  S/P L THA  THERAPY DIAG:  Pain in right hip  Pain in left hip  Other low back pain  ONSET DATE:     SUBJECTIVE:                                                                                                                                                                                           SUBJECTIVE STATEMENT:  01/20/2024  Pt with no new complaints.   Pt had surgery on 07/30/23  for  L THA. Anterior approach. States longer recovery with compication- fx. Reports doing well initially. L hip doing well, no pain, feelf more even. Now that she is becoming more active, she is having increased pain. States most pain into bil low back and glutes. She states tightness in bil low back, increased with standing/walking and standing activity, and at end of the day.  She has been able to resume walking up to 30 min/day, walking helps to loosen up. She is only sleeping 3-4 hr/night due to waking up with pain.   She is not wearing heel lift at this time. She had some home health PT, but has not had any PT since then.     PERTINENT HISTORY:  L TKA,  Lumbar fusion L4/5 , previous foot and toe surgeries  PAIN:  Are you having pain? Yes NPRS scale:  4/10 Pain location: bil glutes  and low back Pain description: intermittent ,sore, tight  Aggravating factors:  standing, standing  activity Relieving factors: rest    PRECAUTIONS: None  WEIGHT BEARING RESTRICTIONS No  FALLS:  Has patient fallen in last 6 months? No, Number of falls: 0  OCCUPATION: Retired,   PLOF: Independent  PATIENT GOALS  decreased pain in hips, back, be able to do more activity without pain.     OBJECTIVE:   DIAGNOSTIC FINDINGS:     COGNITION:  Overall cognitive status: Within functional limits for tasks assessed   POSTURE:  Standing: L knee in slight flexion Pelvic alignment much improved from previous visits.     LOWER EXTREMITY ROM:  ROM AROM Eval-    Lumbar: WFL L knee: lacking full flexion and extension (from previous TKA) ; Limited DF bil;  L hip: wfl   LOWER EXTREMITY MMT:   MMT  Eval: Knees: 5/5 Hips: flex: 5/5,  abd: 4+/5 to 5/5.    LUMBAR SPECIAL TESTS:  Neg SLR bil;    GAIT: Decreased hip ext on L    TODAY'S TREATMENT   01/20/2024 Therapeutic Exercise:  Aerobic:  Bike L2 x 5 min Supine:  supine march with GTB  2 x 10; bicycle(high) 2 x 8;  Seated:   S/L:  Prone:   cat /cow x 15;  mod childs pose x 5;  Standing:    Stretches:  Neuromuscular Re-education:  Manual Therapy:   TPR R low lumbar paraspinals.  Therapeutic Activity:    Self Care:     Therapeutic Exercise: review of HEP for lumbar flexion bias, core and hip strength.  Aerobic:   Supine:  supine march with TA 2 x 10; SKTC x 3 bil;  Seated:   S/L:  Prone:   cat /cow x 15;  bird dog- LEs only 2 x 5 bil;   Standing:   hip  ext  2 x 10 bil;  Stretches:  fig 4 hip - supine x 2 bil;  Neuromuscular Re-education:  Manual Therapy:   Therapeutic Activity:    Self Care: review of current symptoms, presentation, possible aquatic PT, and possible interventions in the future.      Therapeutic Exercise: Aerobic:  bike L2 x 5 min Supine:   Seated:  ball rolls fwd, L, R for lumbar rom.  S/L:  Prone:   cat /cow x 15;  bird dog- LEs only 2 x 5 bil;  Standing:   hip  ext  2 x 10 bil;  Stretches:  fig 4 hip - supine x 2 bil;  Neuromuscular Re-education:  Manual Therapy:  LAD R lumbar;  lumbar PA s,  TPR R lumbar paraspinals Therapeutic Activity:   Step ups fwd 6 in with opp knee drive 2 x 10 bil, 89oa hold;  (cueing for back posture)  ; slow march without UE support x 20;  Self Care:     Therapeutic Exercise: Aerobic:  Supine:   SLR  x 10 with TA;   practice for TA x 5;   bridging 2 x 5;  Seated:  S/L: hip abd x 12   Prone:   hip ext x 10 bil;   quad stretch with strap on L;  Standing:  lumbar ext x 10;  Stretches:  LTR x 10;  review of cat cow ;  Neuromuscular Re-education:  Manual Therapy: prone knee flexion for quad stretch on L;  Therapeutic Activity:  Self Care:    PATIENT  EDUCATION:  Education details: updated and reviewed HEP  Person educated: Patient Education method: Explanation, Demonstration, Tactile cues, Verbal cues, and Handouts Education comprehension: verbalized understanding, returned demonstration, verbal cues required, tactile cues required, and needs further education   HOME EXERCISE PROGRAM: Access Code: [email or web address](previous) Access Code: [email or web address]  (previous)  Access Code: [email or web address] URL: [email or web address] Date: 11/01/2023 Prepared by: Tinnie Don  Exercises - Supine Lower Trunk Rotation  - 2 x daily - 10 reps - 5 hold - Quadruped Cat Cow  - 2 x daily - 10 reps - 5 sec hold - Straight Leg Raise  - 1 x daily - 5-6 x weekly - 2 sets - 10 reps - Sidelying Hip Abduction  - 1 x  daily - 5-6 x weekly - 1-2 sets - 10 reps - Prone Hip Extension  - 1 x daily - 4-5 x weekly - 1 sets - 10 reps - Supine Bridge  - 1 x daily - 1-2 sets - 10 reps    ASSESSMENT:  CLINICAL IMPRESSION: 01/20/2024 Pt with good tolerance for activity today. She has soreness in R low lumbar, addressed with TPR today. Continued lumbar and hip mobility as well as core strength. Pt to be seen for 1 more visit, then likely transition to pool.   Eval: Pt presents with deficits following L THA. She has Stiffness in L hip, with mild decrease in ROM. She has mild strength deficits as well. She has lack of effective HEP for ongoing deficits. She has much tightness and soreness in bil low back into bil glutes with increased standing activity. She does have improved posture and alignment since having THA, since being seen in PT last time. She will benefit from continued work on lumbar and hip mobility, achieving upright posture for decreasing pain. Pt with decreased ability for full functional activities and sustaining standing/walking activities. Pt to benefit from skilled PT to improve deficits and pain .    OBJECTIVE IMPAIRMENTS Abnormal gait, decreased activity  tolerance, decreased balance, decreased knowledge of use of DME, decreased mobility, difficulty walking, decreased ROM, decreased strength, increased muscle spasms, impaired flexibility, improper body mechanics, and pain.   ACTIVITY LIMITATIONS meal prep, cleaning, laundry, shopping, community activity, and yard work.   PERSONAL FACTORS Past/current experiences /foot surgeries, L hip stiffness, leg length discrepancy, are effecting pts outcome.  REHAB POTENTIAL: Good  CLINICAL DECISION MAKING: Stable/uncomplicated  EVALUATION COMPLEXITY: Low    GOALS: Goals reviewed with patient? Yes  SHORT TERM GOALS: Target date: 11/22/2023   Patient to be independent with initial HEP  Goal status: MET    LONG TERM GOALS: Target date: 02/28/2024   Patient to be independent with final HEP  Goal status: In progress   2.  Patient to demo improved ROM of L hip and back , to be wfl to improve ability for more upright posture and gait.   Goal status: In progress   3.  Patient to demo improved strength of  bil hips to 4+5,  to improve stability and pain  Goal status: In progress   4.  Pt to report ability for sustaining at least 1 hours of standing/walking activity with pain 0-2/10, to improve ability for community activity.  Goal status: In progress    PLAN: PT FREQUENCY: 1-2x/week  PT DURATION: 8 weeks  PLANNED INTERVENTIONS: Therapeutic exercises, Therapeutic activity, Neuromuscular re-education, Balance training, Gait training, Patient/Family education, Self Care, Joint mobilization, Joint manipulation, Stair training, Orthotic/Fit training, DME instructions, Dry Needling, Electrical stimulation, Spinal mobilization, Moist heat, Taping, Traction, Ultrasound, Ionotophoresis 4mg /ml Dexamethasone , Manual therapy, and Neuro Muscular re-education  PLAN FOR NEXT SESSION:     Tinnie Don, PT, DPT 10:19 AM  01/20/24

## 2024-01-25 ENCOUNTER — Encounter: Payer: Self-pay | Admitting: Sports Medicine

## 2024-01-26 ENCOUNTER — Ambulatory Visit: Admitting: Physical Therapy

## 2024-01-26 ENCOUNTER — Encounter: Payer: Self-pay | Admitting: Physical Therapy

## 2024-01-26 DIAGNOSIS — M25552 Pain in left hip: Secondary | ICD-10-CM | POA: Diagnosis not present

## 2024-01-26 DIAGNOSIS — M25551 Pain in right hip: Secondary | ICD-10-CM

## 2024-01-26 DIAGNOSIS — M5459 Other low back pain: Secondary | ICD-10-CM

## 2024-01-26 NOTE — Therapy (Signed)
 OUTPATIENT PHYSICAL THERAPY TREATMENT   Patient Name: Whitney Lewis  LIZZA HUFFAKER MRN: [id number] DOB:02-06-1961, 63 y.o., female Today's Date: 01/26/2024    PT End of Session - 01/26/24 1151     Visit Number 8    Number of Visits 16    Date for PT Re-Evaluation 02/28/24    Authorization Type HTA  re-cert done at visit 5    PT Start Time 0850    PT Stop Time 0930    PT Time Calculation (min) 40 min    Activity Tolerance Patient tolerated treatment well    Behavior During Therapy [facility] for tasks assessed/performed              Past Medical History:  Diagnosis Date   ADD (attention deficit disorder)    Anxiety    Arthritis    feet, knees (07/04/2015)   Bipolar disorder (HCC)    Complication of anesthesia    hard to wake up, blood pressure drops    Depression    Dysrhythmia    PVCs,   Family history of adverse reaction to anesthesia    sister hard to wake up also   Fibromyalgia    GERD (gastroesophageal reflux disease)    Hyperlipidemia    Hypertension    Migraine    hormonal; stopped when I was 16 (07/04/2015)   OSA (obstructive sleep apnea)    wears mouth piece, no CPAP   Seizures (HCC)    Past Surgical History:  Procedure Laterality Date   ABDOMINAL HYSTERECTOMY  ~ 1993   CESAREAN SECTION  1987   COLONOSCOPY     COLONOSCOPY WITH PROPOFOL  N/A 09/21/2017   Procedure: COLONOSCOPY WITH PROPOFOL ;  Surgeon: Gaylyn Gladis PENNER, MD;  Location: [facility] ENDOSCOPY;  Service: Endoscopy;  Laterality: N/A;   EYE SURGERY     gets injections in left retina, 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   HAMMER TOE SURGERY Right 03/2015   HAMMER TOE SURGERY Left 06/06/2015   Procedure: LEFT TWO-THREE  HAMMER TOE CORRECTION ;  Surgeon: Norleen Armor, MD;  Location: [facility];  Service: Orthopedics;  Laterality: Left;   INCISION AND DRAINAGE OF WOUND Right 07/04/2015   Procedure: IRRIGATION AND DEBRIDEMENT OF RIGHT FOOT DEEP ABSCESS; EXCISION OF RIGHT SECOND METATARSAL HEAD; REMOVAL  OF DEEP IMPLANT RIGHT FOOT; EXCISION OF RIGHT SECOND TOE PROXIMAL PHALANX BASE; INSERTION OF ANTIBIOTIC BEADS INTO RIGHT FOOT ABSCESS;  Surgeon: Norleen Armor, MD;  Location: [facility] OR;  Service: Orthopedics;  Laterality: Right;  SITE ALSO  RIGHT FOOT   IRRIGATION AND DEBRIDEMENT FOOT Right 07/04/2015   Irrigation and excisional debridement of right foot deep abscess;; Insertion of antibiotic beads into the right foot abscess   JOINT REPLACEMENT     MAXIMUM ACCESS (MAS)POSTERIOR LUMBAR INTERBODY FUSION (PLIF) 1 LEVEL N/A 01/01/2017   Procedure: L4-5 MAXIMUM ACCESS (MAS) POSTERIOR LUMBAR INTERBODY FUSION (PLIF);  Surgeon: Joshua Alm RAMAN, MD;  Location: [facility] OR;  Service: Neurosurgery;  Laterality: N/A;   TOTAL HIP ARTHROPLASTY Left 07/30/2023   Procedure: LEFT TOTAL HIP ARTHROPLASTY ANTERIOR APPROACH;  Surgeon: Vernetta Lonni GRADE, MD;  Location: [facility];  Service: Orthopedics;  Laterality: Left;   TOTAL KNEE ARTHROPLASTY Left    WEIL OSTEOTOMY Left 06/06/2015   Procedure: LEFT TWO AND THREE METATARSAL WEIL OSTEOTOMY ;  Surgeon: Norleen Armor, MD;  Location: [facility];  Service: Orthopedics;  Laterality: Left;   Patient Active Problem List   Diagnosis Date Noted   Status post total replacement of  left hip 07/30/2023   Atypical chest pain 07/27/2023   Nonspecific abnormal electrocardiogram (ECG) (EKG) 07/27/2023   Chronic left hip pain 04/13/2023   Metatarsal fracture 09/11/2021   S/P lumbar spinal fusion 01/01/2017   ADD (attention deficit disorder) 07/17/2015   Bipolar affective disorder (HCC) 07/17/2015   HLD (hyperlipidemia) 07/17/2015   BP (high blood pressure) 07/17/2015   Obstructive apnea 07/17/2015   Staphylococcus aureus infection    Foot osteomyelitis, right (HCC) 07/04/2015    PCP: Larnell Hamilton, MD  REFERRING PROVIDER: Vernetta  REFERRING DIAG:  S/P L THA  THERAPY DIAG:  Pain in right hip  Pain in left hip  Other low back pain  ONSET DATE:     SUBJECTIVE:                                                                                                                                                                                           SUBJECTIVE STATEMENT:  01/26/2024  Pt with no new complaints. Back not feeling too bad, but still getting sore with increased standing and activity. Doing well with HEP. Scheduled for aquatic PT in October.   Pt had surgery on 07/30/23  for  L THA. Anterior approach. States longer recovery with compication- fx. Reports doing well initially. L hip doing well, no pain, feelf more even. Now that she is becoming more active, she is having increased pain. States most pain into bil low back and glutes. She states tightness in bil low back, increased with standing/walking and standing activity, and at end of the day.  She has been able to resume walking up to 30 min/day, walking helps to loosen up. She is only sleeping 3-4 hr/night due to waking up with pain.   She is not wearing heel lift at this time. She had some home health PT, but has not had any PT since then.     PERTINENT HISTORY:  L TKA,  Lumbar fusion L4/5 , previous foot and toe surgeries  PAIN:  Are you having pain? Yes NPRS scale:  4/10 Pain location: bil glutes  and low back Pain description: intermittent ,sore, tight  Aggravating factors:  standing, standing  activity Relieving factors: rest    PRECAUTIONS: None  WEIGHT BEARING RESTRICTIONS No  FALLS:  Has patient fallen in last 6 months? No, Number of falls: 0  OCCUPATION: Retired,   PLOF: Independent  PATIENT GOALS  decreased pain in hips, back, be able to do more activity without pain.     OBJECTIVE:   DIAGNOSTIC FINDINGS:     COGNITION:  Overall cognitive status: Within functional limits for tasks assessed  POSTURE:  Standing: L knee in slight flexion Pelvic alignment much improved from previous visits.     LOWER EXTREMITY ROM:    ROM AROM Eval-     Lumbar: WFL L knee: lacking full flexion and extension (from previous TKA) ; Limited DF bil;  L hip: wfl   LOWER EXTREMITY MMT:   MMT Eval: Knees: 5/5 Hips: flex: 5/5,  abd: 4+/5 to 5/5.    LUMBAR SPECIAL TESTS:  Neg SLR bil;    GAIT: Decreased hip ext on L    TODAY'S TREATMENT   01/26/2024 Therapeutic Exercise:  Aerobic:  Bike L2 x 5 min Supine:  supine march with GTB  2 x 10;    bicycle(high) 2 x 8;    SKTC x 3 ; LTR x 10;  SLR x 10 with TA;   Seated:   S/L:  Prone:   cat /cow x 15;  modified childs pose x 5;  Standing:   Hip abd and ext 2 x 10 ea bil- review for HEP Stretches:  Neuromuscular Re-education:  Manual Therapy:    Therapeutic Activity:  Review for HEP- single leg step up and down on 6 in step x 10 bil;   Squats at wall x 5, with TRX strap x 10,  Rows Blue TB x 20;  review of strengthening for gym.  Self Care:     Therapeutic Exercise: review of HEP for lumbar flexion bias, core and hip strength.  Aerobic:   Supine:  supine march with TA 2 x 10; SKTC x 3 bil;  Seated:   S/L:  Prone:   cat /cow x 15;  bird dog- LEs only 2 x 5 bil;   Standing:   hip  ext  2 x 10 bil;  Stretches:  fig 4 hip - supine x 2 bil;  Neuromuscular Re-education:  Manual Therapy:   Therapeutic Activity:    Self Care: review of current symptoms, presentation, possible aquatic PT, and possible interventions in the future.     PATIENT EDUCATION:  Education details: updated and reviewed HEP  Person educated: Patient Education method: Explanation, Demonstration, Tactile cues, Verbal cues, and Handouts Education comprehension: verbalized understanding, returned demonstration, verbal cues required, tactile cues required, and needs further education   HOME EXERCISE PROGRAM: Access Code: [email or web address](previous) Access Code: [email or web address]  (previous)  Access Code: [email or web address] URL: [email or web address] Date: 11/01/2023 Prepared by: Tinnie Don  Exercises - Supine Lower  Trunk Rotation  - 2 x daily - 10 reps - 5 hold - Quadruped Cat Cow  - 2 x daily - 10 reps - 5 sec hold - Straight Leg Raise  - 1 x daily - 5-6 x weekly - 2 sets - 10 reps - Sidelying Hip Abduction  - 1 x daily - 5-6 x weekly - 1-2 sets - 10 reps - Prone Hip Extension  - 1 x daily - 4-5 x weekly - 1 sets - 10 reps - Supine Bridge  - 1 x daily - 1-2 sets - 10 reps    ASSESSMENT:  CLINICAL IMPRESSION: 01/26/2024 Pt with good tolerance for activity today. She has made good progress with L hip mobility and strengthening. She continues to have bothersome pain in bil glutes and back. She is doing well with land HEP. Will transition to aquatic PT for education on activities in pool for exercise and to reduce back pain.   Eval: Pt presents with deficits following L THA. She has Stiffness in L hip, with mild decrease in ROM. She  has mild strength deficits as well. She has lack of effective HEP for ongoing deficits. She has much tightness and soreness in bil low back into bil glutes with increased standing activity. She does have improved posture and alignment since having THA, since being seen in PT last time. She will benefit from continued work on lumbar and hip mobility, achieving upright posture for decreasing pain. Pt with decreased ability for full functional activities and sustaining standing/walking activities. Pt to benefit from skilled PT to improve deficits and pain .    OBJECTIVE IMPAIRMENTS Abnormal gait, decreased activity tolerance, decreased balance, decreased knowledge of use of DME, decreased mobility, difficulty walking, decreased ROM, decreased strength, increased muscle spasms, impaired flexibility, improper body mechanics, and pain.   ACTIVITY LIMITATIONS meal prep, cleaning, laundry, shopping, community activity, and yard work.   PERSONAL FACTORS Past/current experiences /foot surgeries, L hip stiffness, leg length discrepancy, are effecting pts outcome.  REHAB POTENTIAL:  Good  CLINICAL DECISION MAKING: Stable/uncomplicated  EVALUATION COMPLEXITY: Low    GOALS: Goals reviewed with patient? Yes  SHORT TERM GOALS: Target date: 11/22/2023   Patient to be independent with initial HEP  Goal status: MET    LONG TERM GOALS: Target date: 02/28/2024   Patient to be independent with final HEP  Goal status: in progress   2.  Patient to demo improved ROM of L hip and back , to be wfl to improve ability for more upright posture and gait.   Goal status:  In progress   3.  Patient to demo improved strength of  bil hips to 4+5,  to improve stability and pain  Goal status: In progress   4.  Pt to report ability for sustaining at least 1 hours of standing/walking activity with pain 0-2/10, to improve ability for community activity.  Goal status: In progress    PLAN: PT FREQUENCY: 1-2x/week  PT DURATION: 8 weeks  PLANNED INTERVENTIONS: Therapeutic exercises, Therapeutic activity, Neuromuscular re-education, Balance training, Gait training, Patient/Family education, Self Care, Joint mobilization, Joint manipulation, Stair training, Orthotic/Fit training, DME instructions, Dry Needling, Electrical stimulation, Spinal mobilization, Moist heat, Taping, Traction, Ultrasound, Ionotophoresis 4mg /ml Dexamethasone , Manual therapy, and Neuro Muscular re-education  PLAN FOR NEXT SESSION:   transition to pool for education on aquatic mobility and exercise.    Tinnie Don, PT, DPT 11:52 AM  01/26/24

## 2024-01-27 ENCOUNTER — Encounter: Payer: Self-pay | Admitting: Physical Therapy

## 2024-01-31 DIAGNOSIS — H2513 Age-related nuclear cataract, bilateral: Secondary | ICD-10-CM | POA: Diagnosis not present

## 2024-01-31 DIAGNOSIS — H34812 Central retinal vein occlusion, left eye, with macular edema: Secondary | ICD-10-CM | POA: Diagnosis not present

## 2024-01-31 DIAGNOSIS — H43813 Vitreous degeneration, bilateral: Secondary | ICD-10-CM | POA: Diagnosis not present

## 2024-01-31 DIAGNOSIS — H35373 Puckering of macula, bilateral: Secondary | ICD-10-CM | POA: Diagnosis not present

## 2024-01-31 DIAGNOSIS — H35033 Hypertensive retinopathy, bilateral: Secondary | ICD-10-CM | POA: Diagnosis not present

## 2024-01-31 DIAGNOSIS — D3132 Benign neoplasm of left choroid: Secondary | ICD-10-CM | POA: Diagnosis not present

## 2024-02-08 DIAGNOSIS — F3161 Bipolar disorder, current episode mixed, mild: Secondary | ICD-10-CM | POA: Diagnosis not present

## 2024-02-09 ENCOUNTER — Encounter: Admitting: Physical Therapy

## 2024-02-23 DIAGNOSIS — Z0189 Encounter for other specified special examinations: Secondary | ICD-10-CM | POA: Diagnosis not present

## 2024-02-23 DIAGNOSIS — E785 Hyperlipidemia, unspecified: Secondary | ICD-10-CM | POA: Diagnosis not present

## 2024-02-23 DIAGNOSIS — M8580 Other specified disorders of bone density and structure, unspecified site: Secondary | ICD-10-CM | POA: Diagnosis not present

## 2024-02-23 DIAGNOSIS — I10 Essential (primary) hypertension: Secondary | ICD-10-CM | POA: Diagnosis not present

## 2024-02-24 ENCOUNTER — Ambulatory Visit (HOSPITAL_BASED_OUTPATIENT_CLINIC_OR_DEPARTMENT_OTHER): Attending: Orthopaedic Surgery | Admitting: Physical Therapy

## 2024-02-24 ENCOUNTER — Encounter (HOSPITAL_BASED_OUTPATIENT_CLINIC_OR_DEPARTMENT_OTHER): Payer: Self-pay | Admitting: Physical Therapy

## 2024-02-24 DIAGNOSIS — M25551 Pain in right hip: Secondary | ICD-10-CM | POA: Insufficient documentation

## 2024-02-24 DIAGNOSIS — M25552 Pain in left hip: Secondary | ICD-10-CM | POA: Diagnosis not present

## 2024-02-24 DIAGNOSIS — M545 Low back pain, unspecified: Secondary | ICD-10-CM | POA: Diagnosis not present

## 2024-02-24 DIAGNOSIS — G8929 Other chronic pain: Secondary | ICD-10-CM | POA: Insufficient documentation

## 2024-02-24 DIAGNOSIS — M5459 Other low back pain: Secondary | ICD-10-CM | POA: Insufficient documentation

## 2024-02-24 NOTE — Therapy (Signed)
 OUTPATIENT PHYSICAL THERAPY TREATMENT   Patient Name: Whitney Lewis MRN: [id number] DOB:1961-03-09, 63 y.o., female Today's Date: 02/24/2024    PT End of Session - 02/24/24 0830     Visit Number 9    Number of Visits 16    Date for Recertification  02/28/24    Authorization Type HTA  re-cert done at visit 5    PT Start Time 0800    PT Stop Time 0840    PT Time Calculation (min) 40 min    Activity Tolerance Patient tolerated treatment well    Behavior During Therapy [facility] for tasks assessed/performed               Past Medical History:  Diagnosis Date   ADD (attention deficit disorder)    Anxiety    Arthritis    feet, knees (07/04/2015)   Bipolar disorder (HCC)    Complication of anesthesia    hard to wake up, blood pressure drops    Depression    Dysrhythmia    PVCs,   Family history of adverse reaction to anesthesia    sister hard to wake up also   Fibromyalgia    GERD (gastroesophageal reflux disease)    Hyperlipidemia    Hypertension    Migraine    hormonal; stopped when I was 16 (07/04/2015)   OSA (obstructive sleep apnea)    wears mouth piece, no CPAP   Seizures (HCC)    Past Surgical History:  Procedure Laterality Date   ABDOMINAL HYSTERECTOMY  ~ 1993   CESAREAN SECTION  1987   COLONOSCOPY     COLONOSCOPY WITH PROPOFOL  N/A 09/21/2017   Procedure: COLONOSCOPY WITH PROPOFOL ;  Surgeon: Gaylyn Gladis PENNER, MD;  Location: [facility] ENDOSCOPY;  Service: Endoscopy;  Laterality: N/A;   EYE SURGERY     gets injections in left retina, 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   HAMMER TOE SURGERY Right 03/2015   HAMMER TOE SURGERY Left 06/06/2015   Procedure: LEFT TWO-THREE  HAMMER TOE CORRECTION ;  Surgeon: Norleen Armor, MD;  Location: [facility];  Service: Orthopedics;  Laterality: Left;   INCISION AND DRAINAGE OF WOUND Right 07/04/2015   Procedure: IRRIGATION AND DEBRIDEMENT OF RIGHT FOOT DEEP ABSCESS; EXCISION OF RIGHT SECOND METATARSAL HEAD;  REMOVAL OF DEEP IMPLANT RIGHT FOOT; EXCISION OF RIGHT SECOND TOE PROXIMAL PHALANX BASE; INSERTION OF ANTIBIOTIC BEADS INTO RIGHT FOOT ABSCESS;  Surgeon: Norleen Armor, MD;  Location: [facility] OR;  Service: Orthopedics;  Laterality: Right;  SITE ALSO  RIGHT FOOT   IRRIGATION AND DEBRIDEMENT FOOT Right 07/04/2015   Irrigation and excisional debridement of right foot deep abscess;; Insertion of antibiotic beads into the right foot abscess   JOINT REPLACEMENT     MAXIMUM ACCESS (MAS)POSTERIOR LUMBAR INTERBODY FUSION (PLIF) 1 LEVEL N/A 01/01/2017   Procedure: L4-5 MAXIMUM ACCESS (MAS) POSTERIOR LUMBAR INTERBODY FUSION (PLIF);  Surgeon: Joshua Alm RAMAN, MD;  Location: [facility] OR;  Service: Neurosurgery;  Laterality: N/A;   TOTAL HIP ARTHROPLASTY Left 07/30/2023   Procedure: LEFT TOTAL HIP ARTHROPLASTY ANTERIOR APPROACH;  Surgeon: Vernetta Lonni GRADE, MD;  Location: [facility];  Service: Orthopedics;  Laterality: Left;   TOTAL KNEE ARTHROPLASTY Left    WEIL OSTEOTOMY Left 06/06/2015   Procedure: LEFT TWO AND THREE METATARSAL WEIL OSTEOTOMY ;  Surgeon: Norleen Armor, MD;  Location: [facility];  Service: Orthopedics;  Laterality: Left;   Patient Active Problem List   Diagnosis Date Noted   Status post total replacement  of left hip 07/30/2023   Atypical chest pain 07/27/2023   Nonspecific abnormal electrocardiogram (ECG) (EKG) 07/27/2023   Chronic left hip pain 04/13/2023   Metatarsal fracture 09/11/2021   S/P lumbar spinal fusion 01/01/2017   ADD (attention deficit disorder) 07/17/2015   Bipolar affective disorder (HCC) 07/17/2015   HLD (hyperlipidemia) 07/17/2015   BP (high blood pressure) 07/17/2015   Obstructive apnea 07/17/2015   Staphylococcus aureus infection    Foot osteomyelitis, right (HCC) 07/04/2015    PCP: Larnell Hamilton, MD  REFERRING PROVIDER: Vernetta  REFERRING DIAG:  S/P L THA  THERAPY DIAG:  Pain in right hip  Pain in left hip  Other low back pain  ONSET DATE:     SUBJECTIVE:                                                                                                                                                                                           SUBJECTIVE STATEMENT: LBP hips 2-3/10.  Sleep at night pain in lb get up it is in hips.  Walk 30 mins daily and complete stretching exercises at home as per last PT. Does have pool access   02/24/2024  Pt with no new complaints. Back not feeling too bad, but still getting sore with increased standing and activity. Doing well with HEP. Scheduled for aquatic PT in October.   Pt had surgery on 07/30/23  for  L THA. Anterior approach. States longer recovery with compication- fx. Reports doing well initially. L hip doing well, no pain, feelf more even. Now that she is becoming more active, she is having increased pain. States most pain into bil low back and glutes. She states tightness in bil low back, increased with standing/walking and standing activity, and at end of the day.  She has been able to resume walking up to 30 min/day, walking helps to loosen up. She is only sleeping 3-4 hr/night due to waking up with pain.   She is not wearing heel lift at this time. She had some home health PT, but has not had any PT since then.     PERTINENT HISTORY:  L TKA,  Lumbar fusion L4/5 , previous foot and toe surgeries  PAIN:  Are you having pain? Yes NPRS scale:  4/10 Pain location: bil glutes  and low back Pain description: intermittent ,sore, tight  Aggravating factors:  standing, standing  activity Relieving factors: rest    PRECAUTIONS: None  WEIGHT BEARING RESTRICTIONS No  FALLS:  Has patient fallen in last 6 months? No, Number of falls: 0  OCCUPATION: Retired,   PLOF: Independent  PATIENT GOALS  decreased  pain in hips, back, be able to do more activity without pain.     OBJECTIVE:   DIAGNOSTIC FINDINGS:     COGNITION:  Overall cognitive status: Within functional limits for tasks  assessed   POSTURE:  Standing: L knee in slight flexion Pelvic alignment much improved from previous visits.     LOWER EXTREMITY ROM:    ROM AROM Eval-    Lumbar: WFL L knee: lacking full flexion and extension (from previous TKA) ; Limited DF bil;  L hip: wfl   LOWER EXTREMITY MMT:   MMT Eval: Knees: 5/5 Hips: flex: 5/5,  abd: 4+/5 to 5/5.   10/2: hips flex 5/5, Abd 5- to 5/5   LUMBAR SPECIAL TESTS:  Neg SLR bil;    GAIT: Decreased hip ext on L    TODAY'S TREATMENT  [facility] Adult PT Treatment:                                                DATE: 02/24/24 Pt seen for aquatic therapy today.  Treatment took place in water 3.5-4.75 ft in depth at the [facility] pool. Temp of water was 91.  Pt entered/exited the pool via stairs using alternating pattern with hand rail.  *Intro to setting *walking forward, back and side stepping in 3.6 ft with unsupported *side step ue add/abd yellow hb->side lunge *forward split squat 4 laps *yellow HB pull down wide stance then staggered x 10 *step ups leading R/L x 10->runners step ups x 10  Pt requires the buoyancy and hydrostatic pressure of water for support, and to offload joints by unweighting joint load by at least 50 % in navel deep water and by at least 75-80% in chest to neck deep water.  Viscosity of the water is needed for resistance of strengthening. Water current perturbations provides challenge to standing balance requiring increased core activation.      02/24/2024 Therapeutic Exercise:  Aerobic:  Bike L2 x 5 min Supine:  supine march with GTB  2 x 10;    bicycle(high) 2 x 8;    SKTC x 3 ; LTR x 10;  SLR x 10 with TA;   Seated:   S/L:  Prone:   cat /cow x 15;  modified childs pose x 5;  Standing:   Hip abd and ext 2 x 10 ea bil- review for HEP Stretches:  Neuromuscular Re-education:  Manual Therapy:    Therapeutic Activity:  Review for HEP- single leg step up and down on 6 in step x 10 bil;   Squats at  wall x 5, with TRX strap x 10,  Rows Blue TB x 20;  review of strengthening for gym.  Self Care:     Therapeutic Exercise: review of HEP for lumbar flexion bias, core and hip strength.  Aerobic:   Supine:  supine march with TA 2 x 10; SKTC x 3 bil;  Seated:   S/L:  Prone:   cat /cow x 15;  bird dog- LEs only 2 x 5 bil;   Standing:   hip  ext  2 x 10 bil;  Stretches:  fig 4 hip - supine x 2 bil;  Neuromuscular Re-education:  Manual Therapy:   Therapeutic Activity:    Self Care: review of current symptoms, presentation, possible aquatic PT, and possible interventions in the future.  PATIENT EDUCATION:  Education details: updated and reviewed HEP  Person educated: Patient Education method: Explanation, Demonstration, Tactile cues, Verbal cues, and Handouts Education comprehension: verbalized understanding, returned demonstration, verbal cues required, tactile cues required, and needs further education   HOME EXERCISE PROGRAM: Access Code: [email or web address](previous) Access Code: [email or web address]  (previous)  Access Code: [email or web address] URL: [email or web address] Date: 11/01/2023 Prepared by: Tinnie Don  Exercises - Supine Lower Trunk Rotation  - 2 x daily - 10 reps - 5 hold - Quadruped Cat Cow  - 2 x daily - 10 reps - 5 sec hold - Straight Leg Raise  - 1 x daily - 5-6 x weekly - 2 sets - 10 reps - Sidelying Hip Abduction  - 1 x daily - 5-6 x weekly - 1-2 sets - 10 reps - Prone Hip Extension  - 1 x daily - 4-5 x weekly - 1 sets - 10 reps - Supine Bridge  - 1 x daily - 1-2 sets - 10 reps    ASSESSMENT:  CLINICAL IMPRESSION: Pt demonstrates safety and independence in aquatic setting with therapist instructing from deck. She is confident in setting, moving throughout all depths easily.  Pt is directed through various movement patterns and trials in both sitting and standing positions. Focused on hip and core strengthening.  She is provided VC and demonstration throughout  session for execution of exercises and monitoring toleration.Plan to see her for 3-4 more sessions for full instruction on core and hip strengthening then assign HEP.  Goals are ongoing.    Eval: Pt presents with deficits following L THA. She has Stiffness in L hip, with mild decrease in ROM. She has mild strength deficits as well. She has lack of effective HEP for ongoing deficits. She has much tightness and soreness in bil low back into bil glutes with increased standing activity. She does have improved posture and alignment since having THA, since being seen in PT last time. She will benefit from continued work on lumbar and hip mobility, achieving upright posture for decreasing pain. Pt with decreased ability for full functional activities and sustaining standing/walking activities. Pt to benefit from skilled PT to improve deficits and pain .    OBJECTIVE IMPAIRMENTS Abnormal gait, decreased activity tolerance, decreased balance, decreased knowledge of use of DME, decreased mobility, difficulty walking, decreased ROM, decreased strength, increased muscle spasms, impaired flexibility, improper body mechanics, and pain.   ACTIVITY LIMITATIONS meal prep, cleaning, laundry, shopping, community activity, and yard work.   PERSONAL FACTORS Past/current experiences /foot surgeries, L hip stiffness, leg length discrepancy, are effecting pts outcome.  REHAB POTENTIAL: Good  CLINICAL DECISION MAKING: Stable/uncomplicated  EVALUATION COMPLEXITY: Low    GOALS: Goals reviewed with patient? Yes  SHORT TERM GOALS: Target date: 11/22/2023   Patient to be independent with initial HEP  Goal status: MET    LONG TERM GOALS: Target date: 02/28/2024   Patient to be independent with final HEP  Goal status: in progress   2.  Patient to demo improved ROM of L hip and back , to be wfl to improve ability for more upright posture and gait.   Goal status:  In progress   3.  Patient to demo improved  strength of  bil hips to 4+5,  to improve stability and pain  Goal status: In progress   4.  Pt to report ability for sustaining at least 1 hours of standing/walking activity with pain 0-2/10, to improve ability for community activity.  Goal status: In progress  PLAN: PT FREQUENCY: 1-2x/week  PT DURATION: 8 weeks  PLANNED INTERVENTIONS: Therapeutic exercises, Therapeutic activity, Neuromuscular re-education, Balance training, Gait training, Patient/Family education, Self Care, Joint mobilization, Joint manipulation, Stair training, Orthotic/Fit training, DME instructions, Dry Needling, Electrical stimulation, Spinal mobilization, Moist heat, Taping, Traction, Ultrasound, Ionotophoresis 4mg /ml Dexamethasone , Manual therapy, and Neuro Muscular re-education  PLAN FOR NEXT SESSION:   transition to pool for education on aquatic mobility and exercise.    Ronal Decorah) Victoriana Aziz MPT 02/24/24 10:31 AM [facility] [address] [place], [place], [address] Phone: [phone]   Fax:  [phone]

## 2024-02-28 DIAGNOSIS — H34812 Central retinal vein occlusion, left eye, with macular edema: Secondary | ICD-10-CM | POA: Diagnosis not present

## 2024-02-29 ENCOUNTER — Ambulatory Visit (HOSPITAL_BASED_OUTPATIENT_CLINIC_OR_DEPARTMENT_OTHER): Payer: Self-pay | Admitting: Physical Therapy

## 2024-03-01 DIAGNOSIS — G4733 Obstructive sleep apnea (adult) (pediatric): Secondary | ICD-10-CM | POA: Diagnosis not present

## 2024-03-01 DIAGNOSIS — H348122 Central retinal vein occlusion, left eye, stable: Secondary | ICD-10-CM | POA: Diagnosis not present

## 2024-03-01 DIAGNOSIS — Z23 Encounter for immunization: Secondary | ICD-10-CM | POA: Diagnosis not present

## 2024-03-01 DIAGNOSIS — Z0000 Encounter for general adult medical examination without abnormal findings: Secondary | ICD-10-CM | POA: Diagnosis not present

## 2024-03-01 DIAGNOSIS — Z1339 Encounter for screening examination for other mental health and behavioral disorders: Secondary | ICD-10-CM | POA: Diagnosis not present

## 2024-03-01 DIAGNOSIS — I8390 Asymptomatic varicose veins of unspecified lower extremity: Secondary | ICD-10-CM | POA: Diagnosis not present

## 2024-03-01 DIAGNOSIS — H04129 Dry eye syndrome of unspecified lacrimal gland: Secondary | ICD-10-CM | POA: Diagnosis not present

## 2024-03-01 DIAGNOSIS — M5416 Radiculopathy, lumbar region: Secondary | ICD-10-CM | POA: Diagnosis not present

## 2024-03-01 DIAGNOSIS — M720 Palmar fascial fibromatosis [Dupuytren]: Secondary | ICD-10-CM | POA: Diagnosis not present

## 2024-03-01 DIAGNOSIS — F319 Bipolar disorder, unspecified: Secondary | ICD-10-CM | POA: Diagnosis not present

## 2024-03-01 DIAGNOSIS — Z800 Family history of malignant neoplasm of digestive organs: Secondary | ICD-10-CM | POA: Diagnosis not present

## 2024-03-01 DIAGNOSIS — M1990 Unspecified osteoarthritis, unspecified site: Secondary | ICD-10-CM | POA: Diagnosis not present

## 2024-03-01 DIAGNOSIS — I10 Essential (primary) hypertension: Secondary | ICD-10-CM | POA: Diagnosis not present

## 2024-03-01 DIAGNOSIS — M8580 Other specified disorders of bone density and structure, unspecified site: Secondary | ICD-10-CM | POA: Diagnosis not present

## 2024-03-01 DIAGNOSIS — G40909 Epilepsy, unspecified, not intractable, without status epilepticus: Secondary | ICD-10-CM | POA: Diagnosis not present

## 2024-03-01 DIAGNOSIS — M797 Fibromyalgia: Secondary | ICD-10-CM | POA: Diagnosis not present

## 2024-03-01 DIAGNOSIS — Z1331 Encounter for screening for depression: Secondary | ICD-10-CM | POA: Diagnosis not present

## 2024-03-02 ENCOUNTER — Ambulatory Visit (HOSPITAL_BASED_OUTPATIENT_CLINIC_OR_DEPARTMENT_OTHER): Admitting: Physical Therapy

## 2024-03-02 ENCOUNTER — Encounter (HOSPITAL_BASED_OUTPATIENT_CLINIC_OR_DEPARTMENT_OTHER): Payer: Self-pay | Admitting: Physical Therapy

## 2024-03-02 DIAGNOSIS — M25551 Pain in right hip: Secondary | ICD-10-CM

## 2024-03-02 DIAGNOSIS — M5459 Other low back pain: Secondary | ICD-10-CM

## 2024-03-02 DIAGNOSIS — M25552 Pain in left hip: Secondary | ICD-10-CM

## 2024-03-02 DIAGNOSIS — M545 Low back pain, unspecified: Secondary | ICD-10-CM

## 2024-03-02 NOTE — Addendum Note (Signed)
 Addended by: Darah Simkin F on: 03/02/2024 05:48 PM   Modules accepted: Orders

## 2024-03-02 NOTE — Therapy (Addendum)
 OUTPATIENT PHYSICAL THERAPY TREATMENT Re-cert  Patient Name: Whitney Lewis MRN: [id number] DOB:[birth year], 63 y.o., female Today's Date: 03/02/2024    PT End of Session - 03/02/24 1625     Visit Number 10    Number of Visits 16    Date for Recertification  03/24/24    PT Start Time 1615    PT Stop Time 1653    PT Time Calculation (min) 38 min    Activity Tolerance Patient tolerated treatment well    Behavior During Therapy [facility] for tasks assessed/performed               Past Medical History:  Diagnosis Date   ADD (attention deficit disorder)    Anxiety    Arthritis    feet, knees (07/04/2015)   Bipolar disorder (HCC)    Complication of anesthesia    hard to wake up, blood pressure drops    Depression    Dysrhythmia    PVCs,   Family history of adverse reaction to anesthesia    sister hard to wake up also   Fibromyalgia    GERD (gastroesophageal reflux disease)    Hyperlipidemia    Hypertension    Migraine    hormonal; stopped when I was 16 (07/04/2015)   OSA (obstructive sleep apnea)    wears mouth piece, no CPAP   Seizures (HCC)    Past Surgical History:  Procedure Laterality Date   ABDOMINAL HYSTERECTOMY  ~ 1993   CESAREAN SECTION  1987   COLONOSCOPY     COLONOSCOPY WITH PROPOFOL  N/A 09/21/2017   Procedure: COLONOSCOPY WITH PROPOFOL ;  Surgeon: Gaylyn Gladis PENNER, MD;  Location[facility] ENDOSCOPY;  Service: Endoscopy;  Laterality: N/A;   EYE SURGERY     gets injections in left retina, 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   HAMMER TOE SURGERY Right 03/2015   HAMMER TOE SURGERY Left 06/06/2015   Procedure: LEFT TWO-THREE  HAMMER TOE CORRECTION ;  Surgeon: Norleen Armor, MD;  Location: [facility];  Service: Orthopedics;  Laterality: Left;   INCISION AND DRAINAGE OF WOUND Right 07/04/2015   Procedure: IRRIGATION AND DEBRIDEMENT OF RIGHT FOOT DEEP ABSCESS; EXCISION OF RIGHT SECOND METATARSAL HEAD; REMOVAL OF DEEP IMPLANT RIGHT FOOT; EXCISION OF  RIGHT SECOND TOE PROXIMAL PHALANX BASE; INSERTION OF ANTIBIOTIC BEADS INTO RIGHT FOOT ABSCESS;  Surgeon: Norleen Armor, MD;  Location: [facility] OR;  Service: Orthopedics;  Laterality: Right;  SITE ALSO  RIGHT FOOT   IRRIGATION AND DEBRIDEMENT FOOT Right 07/04/2015   Irrigation and excisional debridement of right foot deep abscess;; Insertion of antibiotic beads into the right foot abscess   JOINT REPLACEMENT     MAXIMUM ACCESS (MAS)POSTERIOR LUMBAR INTERBODY FUSION (PLIF) 1 LEVEL N/A 01/01/2017   Procedure: L4-5 MAXIMUM ACCESS (MAS) POSTERIOR LUMBAR INTERBODY FUSION (PLIF);  Surgeon: Joshua Alm RAMAN, MD;  Location: [facility] OR;  Service: Neurosurgery;  Laterality: N/A;   TOTAL HIP ARTHROPLASTY Left 07/30/2023   Procedure: LEFT TOTAL HIP ARTHROPLASTY ANTERIOR APPROACH;  Surgeon: Vernetta Lonni GRADE, MD;  Location: [facility] ORS;  Service: Orthopedics;  Laterality: Left;   TOTAL KNEE ARTHROPLASTY Left    WEIL OSTEOTOMY Left 06/06/2015   Procedure: LEFT TWO AND THREE METATARSAL WEIL OSTEOTOMY ;  Surgeon: Norleen Armor, MD;  Location: [facility];  Service: Orthopedics;  Laterality: Left;   Patient Active Problem List   Diagnosis Date Noted   Status post total replacement of left hip 07/30/2023   Atypical chest pain 07/27/2023  Nonspecific abnormal electrocardiogram (ECG) (EKG) 07/27/2023   Chronic left hip pain 04/13/2023   Metatarsal fracture 09/11/2021   S/P lumbar spinal fusion 01/01/2017   ADD (attention deficit disorder) 07/17/2015   Bipolar affective disorder (HCC) 07/17/2015   HLD (hyperlipidemia) 07/17/2015   BP (high blood pressure) 07/17/2015   Obstructive apnea 07/17/2015   Staphylococcus aureus infection    Foot osteomyelitis, right (HCC) 07/04/2015    PCP: Larnell Hamilton, MD  REFERRING PROVIDER: Vernetta  REFERRING DIAG:  S/P L THA  THERAPY DIAG:  Pain in right hip  Pain in left hip  Other low back pain  Chronic right-sided low back pain without sciatica  ONSET DATE:     SUBJECTIVE:                                                                                                                                                                                           SUBJECTIVE STATEMENT: Pt reports she had some arm soreness after last session, but overall good tolerance.   POOL ACCESS: member of [employer]   01/26/24 Pt with no new complaints. Back not feeling too bad, but still getting sore with increased standing and activity. Doing well with HEP. Scheduled for aquatic PT in October.   Pt had surgery on 07/30/23  for  L THA. Anterior approach. States longer recovery with compication- fx. Reports doing well initially. L hip doing well, no pain, feelf more even. Now that she is becoming more active, she is having increased pain. States most pain into bil low back and glutes. She states tightness in bil low back, increased with standing/walking and standing activity, and at end of the day.  She has been able to resume walking up to 30 min/day, walking helps to loosen up. She is only sleeping 3-4 hr/night due to waking up with pain.   She is not wearing heel lift at this time. She had some home health PT, but has not had any PT since then.     PERTINENT HISTORY:  L TKA,  Lumbar fusion L4/5 , previous foot and toe surgeries  PAIN:  Are you having pain? Yes NPRS scale:  2/10 an awareness it's there Pain location: bil glutes  and low back Pain description: intermittent ,sore, tight  Aggravating factors:  standing, standing  activity Relieving factors: rest    PRECAUTIONS: None  WEIGHT BEARING RESTRICTIONS No  FALLS:  Has patient fallen in last 6 months? No, Number of falls: 0  OCCUPATION: Retired,   PLOF: Independent  PATIENT GOALS  decreased pain in hips, back, be able to do more activity without pain.  OBJECTIVE:   DIAGNOSTIC FINDINGS:     COGNITION:  Overall cognitive status: Within functional limits for tasks assessed   POSTURE:   Standing: L knee in slight flexion Pelvic alignment much improved from previous visits.     LOWER EXTREMITY ROM:    ROM AROM Eval-    Lumbar: WFL L knee: lacking full flexion and extension (from previous TKA) ; Limited DF bil;  03/02/24 L hip: wfl   LOWER EXTREMITY MMT:   MMT Eval: Knees: 5/5 Hips: flex: 5/5,  abd: 4+/5 to 5/5.   10/2: hips flex 5/5, Abd 5- to 5/5 (re-measured 10/9)   LUMBAR SPECIAL TESTS:  Neg SLR bil;    GAIT: Decreased hip ext on L    TODAY'S TREATMENT  [facility] Adult PT Treatment:                                                DATE: 03/02/24 Pt seen for aquatic therapy today.  Treatment took place in water 3.5-4.75 ft in depth at the [facility] pool. Temp of water was 91.  Pt entered/exited the pool via stairs using alternating pattern with hand rail.   *unsupported walking forward, backward * side stepping with arm add/abdct with rainbow hand floats -> into wide squat with vertical trunk  * forward walking split squats with yellow hand floats for support * TrA set with single / bil rainbow pull down to thighs in wide stance -> hollow full noodle in staggered stance x 10 each  *runners step ups x 10 each LE, light UE support * straddling noodle and cycling with UE on yellow hand floats * 3 way LE stretch with hollow full noodle at ankle 15s x 2 reps each position, each LE  [facility] Adult PT Treatment:                                                DATE: 02/24/24 Pt seen for aquatic therapy today.  Treatment took place in water 3.5-4.75 ft in depth at the [facility] pool. Temp of water was 91.  Pt entered/exited the pool via stairs using alternating pattern with hand rail.  *Intro to setting *walking forward, back and side stepping in 3.6 ft with unsupported *side step ue add/abd yellow hb->side lunge *forward split squat 4 laps *yellow HB pull down wide stance then staggered x 10 *step ups leading R/L x 10->runners step ups x 10  Pt  requires the buoyancy and hydrostatic pressure of water for support, and to offload joints by unweighting joint load by at least 50 % in navel deep water and by at least 75-80% in chest to neck deep water.  Viscosity of the water is needed for resistance of strengthening. Water current perturbations provides challenge to standing balance requiring increased core activation.      03/02/2024 Therapeutic Exercise:  Aerobic:  Bike L2 x 5 min Supine:  supine march with GTB  2 x 10;    bicycle(high) 2 x 8;    SKTC x 3 ; LTR x 10;  SLR x 10 with TA;   Seated:   S/L:  Prone:   cat /cow x 15;  modified childs pose x 5;  Standing:  Hip abd and ext 2 x 10 ea bil- review for HEP Stretches:  Neuromuscular Re-education:  Manual Therapy:    Therapeutic Activity:  Review for HEP- single leg step up and down on 6 in step x 10 bil;   Squats at wall x 5, with TRX strap x 10,  Rows Blue TB x 20;  review of strengthening for gym.  Self Care:     Therapeutic Exercise: review of HEP for lumbar flexion bias, core and hip strength.  Aerobic:   Supine:  supine march with TA 2 x 10; SKTC x 3 bil;  Seated:   S/L:  Prone:   cat /cow x 15;  bird dog- LEs only 2 x 5 bil;   Standing:   hip  ext  2 x 10 bil;  Stretches:  fig 4 hip - supine x 2 bil;  Neuromuscular Re-education:  Manual Therapy:   Therapeutic Activity:    Self Care: review of current symptoms, presentation, possible aquatic PT, and possible interventions in the future.     PATIENT EDUCATION:  Education details: updated and reviewed HEP  Person educated: Patient Education method: Explanation, Demonstration, Tactile cues, Verbal cues, and Handouts Education comprehension: verbalized understanding, returned demonstration, verbal cues required, tactile cues required, and needs further education   HOME EXERCISE PROGRAM: Access Code: [email or web address](previous) Access Code: [email or web address]  (previous)  Access Code: [email or web address] URL:  [email or web address] Date: 11/01/2023 Prepared by: Tinnie Don  Exercises - Supine Lower Trunk Rotation  - 2 x daily - 10 reps - 5 hold - Quadruped Cat Cow  - 2 x daily - 10 reps - 5 sec hold - Straight Leg Raise  - 1 x daily - 5-6 x weekly - 2 sets - 10 reps - Sidelying Hip Abduction  - 1 x daily - 5-6 x weekly - 1-2 sets - 10 reps - Prone Hip Extension  - 1 x daily - 4-5 x weekly - 1 sets - 10 reps - Supine Bridge  - 1 x daily - 1-2 sets - 10 reps    ASSESSMENT:  CLINICAL IMPRESSION: Pt required minor cues for posture, form and to reduce speed of exercise.  Overall good tolerance for exercises completed today.  Dialed back level of hand float resistance used with good tolerance. Per PT, will plan to see her for 2-3 more sessions for full instruction on core and hip strengthening then assign HEP.  Pt making good progress towards remaining goals.    Re-cert: Pt demonstrating improvements in all area of strength and ROM as noted above in charts.  She reports compliance with HEP.  She has had an overall reduction of pain but continues to be bothered by a low level awareness that it is there. She has some difficulty with sustaining a full hour of activity/standing but is able to complete with a slightly elevated pain level. She will continue to benefit from skilled PT intervention in aquatic session to finalize HEP with instructions on using the properties of water to maintain and progress ROM, strength and manage pain.  Addend Ronal Kem) Ziemba MPT 03/02/24 5:45 PM [facility] [address] [place], [place], [address] Phone: [phone]   Fax:  [phone]   Eval: Pt presents with deficits following L THA. She has Stiffness in L hip, with mild decrease in ROM. She has mild strength deficits as well. She has lack of effective HEP for ongoing deficits. She has much tightness and soreness in bil low back into  bil  glutes with increased standing activity. She does have improved posture and alignment since having THA, since being seen in PT last time. She will benefit from continued work on lumbar and hip mobility, achieving upright posture for decreasing pain. Pt with decreased ability for full functional activities and sustaining standing/walking activities. Pt to benefit from skilled PT to improve deficits and pain .    OBJECTIVE IMPAIRMENTS Abnormal gait, decreased activity tolerance, decreased balance, decreased knowledge of use of DME, decreased mobility, difficulty walking, decreased ROM, decreased strength, increased muscle spasms, impaired flexibility, improper body mechanics, and pain.   ACTIVITY LIMITATIONS meal prep, cleaning, laundry, shopping, community activity, and yard work.   PERSONAL FACTORS Past/current experiences /foot surgeries, L hip stiffness, leg length discrepancy, are effecting pts outcome.  REHAB POTENTIAL: Good  CLINICAL DECISION MAKING: Stable/uncomplicated  EVALUATION COMPLEXITY: Low    GOALS: Goals reviewed with patient? Yes  SHORT TERM GOALS: Target date: 11/22/2023   Patient to be independent with initial HEP  Goal status: MET    LONG TERM GOALS: Target date: 02/28/2024   Patient to be independent with final HEP  Goal status: in progress   2.  Patient to demo improved ROM of L hip and back , to be wfl to improve ability for more upright posture and gait.   Goal status:  Met 03/02/24  3.  Patient to demo improved strength of  bil hips to 4+5,  to improve stability and pain  Goal status: Met 10/9  4.  Pt to report ability for sustaining at least 1 hours of standing/walking activity with pain 0-2/10, to improve ability for community activity.  Goal status: In progress    PLAN: PT FREQUENCY: 1-2x/week  PT DURATION: 3 weeks  PLANNED INTERVENTIONS: Therapeutic exercises, Therapeutic activity, Neuromuscular re-education, Balance training, Gait  training, Patient/Family education, Self Care, Joint mobilization, Joint manipulation, Stair training, Orthotic/Fit training, DME instructions, Dry Needling, Electrical stimulation, Spinal mobilization, Moist heat, Taping, Traction, Ultrasound, Ionotophoresis 4mg /ml Dexamethasone , Manual therapy, and Neuro Muscular re-education  PLAN FOR NEXT SESSION:   see above.   Delon Aquas, PTA 03/02/24 5:38 PM [facility] [address] [place], [place], [address] Phone: [phone]   Fax:  [phone]  Addend Ronal Foots) Ziemba MPT 03/02/24 5:45 PM [facility] [address] [place], [place], [address] Phone: [phone]   Fax:  [phone]      .

## 2024-03-08 DIAGNOSIS — F3161 Bipolar disorder, current episode mixed, mild: Secondary | ICD-10-CM | POA: Diagnosis not present

## 2024-03-09 ENCOUNTER — Ambulatory Visit (HOSPITAL_BASED_OUTPATIENT_CLINIC_OR_DEPARTMENT_OTHER): Payer: Self-pay | Admitting: Physical Therapy

## 2024-03-09 ENCOUNTER — Encounter (HOSPITAL_BASED_OUTPATIENT_CLINIC_OR_DEPARTMENT_OTHER): Payer: Self-pay | Admitting: Physical Therapy

## 2024-03-09 DIAGNOSIS — M25551 Pain in right hip: Secondary | ICD-10-CM

## 2024-03-09 DIAGNOSIS — M25552 Pain in left hip: Secondary | ICD-10-CM

## 2024-03-09 NOTE — Therapy (Signed)
 OUTPATIENT PHYSICAL THERAPY TREATMENT   Patient Name: Whitney Lewis MRN: [id number] DOB:05/20/1961, 63 y.o., female Today's Date: 03/09/2024    PT End of Session - 03/09/24 1156     Visit Number 11    Number of Visits 16    Date for Recertification  03/24/24    PT Start Time 1148    PT Stop Time 1230    PT Time Calculation (min) 42 min    Activity Tolerance Patient tolerated treatment well    Behavior During Therapy [facility] for tasks assessed/performed                Past Medical History:  Diagnosis Date   ADD (attention deficit disorder)    Anxiety    Arthritis    feet, knees (07/04/2015)   Bipolar disorder (HCC)    Complication of anesthesia    hard to wake up, blood pressure drops    Depression    Dysrhythmia    PVCs,   Family history of adverse reaction to anesthesia    sister hard to wake up also   Fibromyalgia    GERD (gastroesophageal reflux disease)    Hyperlipidemia    Hypertension    Migraine    hormonal; stopped when I was 16 (07/04/2015)   OSA (obstructive sleep apnea)    wears mouth piece, no CPAP   Seizures (HCC)    Past Surgical History:  Procedure Laterality Date   ABDOMINAL HYSTERECTOMY  ~ 1993   CESAREAN SECTION  1987   COLONOSCOPY     COLONOSCOPY WITH PROPOFOL  N/A 09/21/2017   Procedure: COLONOSCOPY WITH PROPOFOL ;  Surgeon: Gaylyn Gladis PENNER, MD;  Location: [facility];  Service: Endoscopy;  Laterality: N/A;   EYE SURGERY     gets injections in left retina, 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   HAMMER TOE SURGERY Right 03/2015   HAMMER TOE SURGERY Left 06/06/2015   Procedure: LEFT TWO-THREE  HAMMER TOE CORRECTION ;  Surgeon: Norleen Armor, MD;  Location: [facility];  Service: Orthopedics;  Laterality: Left;   INCISION AND DRAINAGE OF WOUND Right 07/04/2015   Procedure: IRRIGATION AND DEBRIDEMENT OF RIGHT FOOT DEEP ABSCESS; EXCISION OF RIGHT SECOND METATARSAL HEAD; REMOVAL OF DEEP IMPLANT RIGHT FOOT; EXCISION OF RIGHT  SECOND TOE PROXIMAL PHALANX BASE; INSERTION OF ANTIBIOTIC BEADS INTO RIGHT FOOT ABSCESS;  Surgeon: Norleen Armor, MD;  Location: [facility] OR;  Service: Orthopedics;  Laterality: Right;  SITE ALSO  RIGHT FOOT   IRRIGATION AND DEBRIDEMENT FOOT Right 07/04/2015   Irrigation and excisional debridement of right foot deep abscess;; Insertion of antibiotic beads into the right foot abscess   JOINT REPLACEMENT     MAXIMUM ACCESS (MAS)POSTERIOR LUMBAR INTERBODY FUSION (PLIF) 1 LEVEL N/A 01/01/2017   Procedure: L4-5 MAXIMUM ACCESS (MAS) POSTERIOR LUMBAR INTERBODY FUSION (PLIF);  Surgeon: Joshua Alm RAMAN, MD;  Location: [facility] OR;  Service: Neurosurgery;  Laterality: N/A;   TOTAL HIP ARTHROPLASTY Left 07/30/2023   Procedure: LEFT TOTAL HIP ARTHROPLASTY ANTERIOR APPROACH;  Surgeon: Vernetta Lonni GRADE, MD;  Location: [facility];  Service: Orthopedics;  Laterality: Left;   TOTAL KNEE ARTHROPLASTY Left    WEIL OSTEOTOMY Left 06/06/2015   Procedure: LEFT TWO AND THREE METATARSAL WEIL OSTEOTOMY ;  Surgeon: Norleen Armor, MD;  Location: [facility];  Service: Orthopedics;  Laterality: Left;   Patient Active Problem List   Diagnosis Date Noted   Status post total replacement of left hip 07/30/2023   Atypical chest pain 07/27/2023  Nonspecific abnormal electrocardiogram (ECG) (EKG) 07/27/2023   Chronic left hip pain 04/13/2023   Metatarsal fracture 09/11/2021   S/P lumbar spinal fusion 01/01/2017   ADD (attention deficit disorder) 07/17/2015   Bipolar affective disorder (HCC) 07/17/2015   HLD (hyperlipidemia) 07/17/2015   BP (high blood pressure) 07/17/2015   Obstructive apnea 07/17/2015   Staphylococcus aureus infection    Foot osteomyelitis, right (HCC) 07/04/2015    PCP: Larnell Hamilton, MD  REFERRING PROVIDER: Vernetta  REFERRING DIAG:  S/P L THA  THERAPY DIAG:  Pain in right hip  Pain in left hip  ONSET DATE:    SUBJECTIVE:                                                                                                                                                                                            SUBJECTIVE STATEMENT: Pt reports sleeping well last night without pain meds.  No residual discomfort after last session.  Feeling good  No pain just an awareness    POOL ACCESS: member of [facility]   Initial Subjective Pt had surgery on 07/30/23  for  L THA. Anterior approach. States longer recovery with compication- fx. Reports doing well initially. L hip doing well, no pain, feelf more even. Now that she is becoming more active, she is having increased pain. States most pain into bil low back and glutes. She states tightness in bil low back, increased with standing/walking and standing activity, and at end of the day.  She has been able to resume walking up to 30 min/day, walking helps to loosen up. She is only sleeping 3-4 hr/night due to waking up with pain.   She is not wearing heel lift at this time. She had some home health PT, but has not had any PT since then.     PERTINENT HISTORY:  L TKA,  Lumbar fusion L4/5 , previous foot and toe surgeries  PAIN:  Are you having pain? Yes NPRS scale:  2/10 an awareness it's there Pain location: bil glutes  and low back Pain description: intermittent ,sore, tight  Aggravating factors:  standing, standing  activity Relieving factors: rest    PRECAUTIONS: None  WEIGHT BEARING RESTRICTIONS No  FALLS:  Has patient fallen in last 6 months? No, Number of falls: 0  OCCUPATION: Retired,   PLOF: Independent  PATIENT GOALS  decreased pain in hips, back, be able to do more activity without pain.     OBJECTIVE:   DIAGNOSTIC FINDINGS:     COGNITION:  Overall cognitive status: Within functional limits for tasks assessed   POSTURE:  Standing: L knee in slight  flexion Pelvic alignment much improved from previous visits.     LOWER EXTREMITY ROM:    ROM AROM Eval-    Lumbar: WFL L knee: lacking full flexion  and extension (from previous TKA) ; Limited DF bil;  03/02/24 L hip: wfl   LOWER EXTREMITY MMT:   MMT Eval: Knees: 5/5 Hips: flex: 5/5,  abd: 4+/5 to 5/5.   10/2: hips flex 5/5, Abd 5- to 5/5 (re-measured 10/9)   LUMBAR SPECIAL TESTS:  Neg SLR bil;    GAIT: Decreased hip ext on L    TODAY'S TREATMENT  [facility] Adult PT Treatment:                                                DATE: 03/09/24 Pt seen for aquatic therapy today.  Treatment took place in water 3.5-4.75 ft in depth at the [facility] pool. Temp of water was 91.  Pt entered/exited the pool via stairs using alternating pattern with hand rail.   *unsupported walking forward, backward * side stepping with arm add/abdct with rainbow hand floats -> into wide squat with vertical trunk  * forward walking split squats with yellow hand floats for support *stair tapping 2nd step 2x 10 *Hip hiking 2 x 10 R/L bottom step *[facility] hip IR/ER 2 x 10 R/L * TrA set  hollow full ->solid noodle in wide then staggered stances x 10 each  *figure 4 stretch * straddling noodle and cycling with UE on yellow hand float    03/09/2024 Therapeutic Exercise:  Aerobic:  Bike L2 x 5 min Supine:  supine march with GTB  2 x 10;    bicycle(high) 2 x 8;    SKTC x 3 ; LTR x 10;  SLR x 10 with TA;   Seated:   S/L:  Prone:   cat /cow x 15;  modified childs pose x 5;  Standing:   Hip abd and ext 2 x 10 ea bil- review for HEP Stretches:  Neuromuscular Re-education:  Manual Therapy:    Therapeutic Activity:  Review for HEP- single leg step up and down on 6 in step x 10 bil;   Squats at wall x 5, with TRX strap x 10,  Rows Blue TB x 20;  review of strengthening for gym.  Self Care:     Therapeutic Exercise: review of HEP for lumbar flexion bias, core and hip strength.  Aerobic:   Supine:  supine march with TA 2 x 10; SKTC x 3 bil;  Seated:   S/L:  Prone:   cat /cow x 15;  bird dog- LEs only 2 x 5 bil;   Standing:   hip  ext  2 x 10 bil;   Stretches:  fig 4 hip - supine x 2 bil;  Neuromuscular Re-education:  Manual Therapy:   Therapeutic Activity:    Self Care: review of current symptoms, presentation, possible aquatic PT, and possible interventions in the future.     PATIENT EDUCATION:  Education details: updated and reviewed HEP  Person educated: Patient Education method: Explanation, Demonstration, Tactile cues, Verbal cues, and Handouts Education comprehension: verbalized understanding, returned demonstration, verbal cues required, tactile cues required, and needs further education   HOME EXERCISE PROGRAM: Access Code: [email or web address](previous) Access Code: [email or web address]  (previous)  Access Code: [email or web address] URL: [email or web address] Date: 11/01/2023 Prepared by: Tinnie Don  Exercises -  Supine Lower Trunk Rotation  - 2 x daily - 10 reps - 5 hold - Quadruped Cat Cow  - 2 x daily - 10 reps - 5 sec hold - Straight Leg Raise  - 1 x daily - 5-6 x weekly - 2 sets - 10 reps - Sidelying Hip Abduction  - 1 x daily - 5-6 x weekly - 1-2 sets - 10 reps - Prone Hip Extension  - 1 x daily - 4-5 x weekly - 1 sets - 10 reps - Supine Bridge  - 1 x daily - 1-2 sets - 10 reps    ASSESSMENT:  CLINICAL IMPRESSION: Cues for slowed pacing of exercise.  Good toleration to added glute exercises. Pt demonstrating improved core strengthening using increased foam resistance with TrA engagement.  Goals ongoing.     Re-cert: Pt demonstrating improvements in all area of strength and ROM as noted above in charts.  She reports compliance with HEP.  She has had an overall reduction of pain but continues to be bothered by a low level awareness that it is there. She has some difficulty with sustaining a full hour of activity/standing but is able to complete with a slightly elevated pain level. She will continue to benefit from skilled PT intervention in aquatic session to finalize HEP with instructions on using the properties of water  to maintain and progress ROM, strength and manage pain.   Eval: Pt presents with deficits following L THA. She has Stiffness in L hip, with mild decrease in ROM. She has mild strength deficits as well. She has lack of effective HEP for ongoing deficits. She has much tightness and soreness in bil low back into bil glutes with increased standing activity. She does have improved posture and alignment since having THA, since being seen in PT last time. She will benefit from continued work on lumbar and hip mobility, achieving upright posture for decreasing pain. Pt with decreased ability for full functional activities and sustaining standing/walking activities. Pt to benefit from skilled PT to improve deficits and pain .    OBJECTIVE IMPAIRMENTS Abnormal gait, decreased activity tolerance, decreased balance, decreased knowledge of use of DME, decreased mobility, difficulty walking, decreased ROM, decreased strength, increased muscle spasms, impaired flexibility, improper body mechanics, and pain.   ACTIVITY LIMITATIONS meal prep, cleaning, laundry, shopping, community activity, and yard work.   PERSONAL FACTORS Past/current experiences /foot surgeries, L hip stiffness, leg length discrepancy, are effecting pts outcome.  REHAB POTENTIAL: Good  CLINICAL DECISION MAKING: Stable/uncomplicated  EVALUATION COMPLEXITY: Low    GOALS: Goals reviewed with patient? Yes  SHORT TERM GOALS: Target date: 11/22/2023   Patient to be independent with initial HEP  Goal status: MET    LONG TERM GOALS: Target date: 02/28/2024   Patient to be independent with final HEP  Goal status: in progress   2.  Patient to demo improved ROM of L hip and back , to be wfl to improve ability for more upright posture and gait.   Goal status:  Met 03/02/24  3.  Patient to demo improved strength of  bil hips to 4+5,  to improve stability and pain  Goal status: Met 10/9  4.  Pt to report ability for sustaining at  least 1 hours of standing/walking activity with pain 0-2/10, to improve ability for community activity.  Goal status: In progress    PLAN: PT FREQUENCY: 1-2x/week  PT DURATION: 3 weeks  PLANNED INTERVENTIONS: Therapeutic exercises, Therapeutic activity, Neuromuscular re-education, Balance training, Gait  training, Patient/Family education, Self Care, Joint mobilization, Joint manipulation, Stair training, Orthotic/Fit training, DME instructions, Dry Needling, Electrical stimulation, Spinal mobilization, Moist heat, Taping, Traction, Ultrasound, Ionotophoresis 4mg /ml Dexamethasone , Manual therapy, and Neuro Muscular re-education  PLAN FOR NEXT SESSION:   see above.    Ronal Elkin) Markese Bloxham MPT 03/09/24 11:57 AM [facility] [address] [place], [place], [address] Phone: [phone]   Fax:  [phone]      .

## 2024-03-15 ENCOUNTER — Ambulatory Visit (HOSPITAL_BASED_OUTPATIENT_CLINIC_OR_DEPARTMENT_OTHER): Payer: Self-pay | Admitting: Physical Therapy

## 2024-03-15 ENCOUNTER — Encounter (HOSPITAL_BASED_OUTPATIENT_CLINIC_OR_DEPARTMENT_OTHER): Payer: Self-pay | Admitting: Physical Therapy

## 2024-03-15 DIAGNOSIS — M25551 Pain in right hip: Secondary | ICD-10-CM | POA: Diagnosis not present

## 2024-03-15 DIAGNOSIS — M5459 Other low back pain: Secondary | ICD-10-CM

## 2024-03-15 DIAGNOSIS — M25552 Pain in left hip: Secondary | ICD-10-CM

## 2024-03-15 NOTE — Therapy (Signed)
 OUTPATIENT PHYSICAL THERAPY TREATMENT   Patient Name: Whitney Lewis MRN: [id number] DOB:[birth year], 63 y.o., female Today's Date: 03/15/2024    PT End of Session - 03/15/24 1542     Visit Number 12    Number of Visits 16    Date for Recertification  03/24/24    PT Start Time 1533    PT Stop Time 1612    PT Time Calculation (min) 39 min    Activity Tolerance Patient tolerated treatment well    Behavior During Therapy [facility] for tasks assessed/performed                 Past Medical History:  Diagnosis Date   ADD (attention deficit disorder)    Anxiety    Arthritis    feet, knees (07/04/2015)   Bipolar disorder (HCC)    Complication of anesthesia    hard to wake up, blood pressure drops    Depression    Dysrhythmia    PVCs,   Family history of adverse reaction to anesthesia    sister hard to wake up also   Fibromyalgia    GERD (gastroesophageal reflux disease)    Hyperlipidemia    Hypertension    Migraine    hormonal; stopped when I was 16 (07/04/2015)   OSA (obstructive sleep apnea)    wears mouth piece, no CPAP   Seizures (HCC)    Past Surgical History:  Procedure Laterality Date   ABDOMINAL HYSTERECTOMY  ~ 1993   CESAREAN SECTION  1987   COLONOSCOPY     COLONOSCOPY WITH PROPOFOL  N/A 09/21/2017   Procedure: COLONOSCOPY WITH PROPOFOL ;  Surgeon: Gaylyn Gladis PENNER, MD;  Location[facility] ENDOSCOPY;  Service: Endoscopy;  Laterality: N/A;   EYE SURGERY     gets injections in left retina, 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   HAMMER TOE SURGERY Right 03/2015   HAMMER TOE SURGERY Left 06/06/2015   Procedure: LEFT TWO-THREE  HAMMER TOE CORRECTION ;  Surgeon: Norleen Armor, MD;  Location: [facility];  Service: Orthopedics;  Laterality: Left;   INCISION AND DRAINAGE OF WOUND Right 07/04/2015   Procedure: IRRIGATION AND DEBRIDEMENT OF RIGHT FOOT DEEP ABSCESS; EXCISION OF RIGHT SECOND METATARSAL HEAD; REMOVAL OF DEEP IMPLANT RIGHT FOOT; EXCISION OF  RIGHT SECOND TOE PROXIMAL PHALANX BASE; INSERTION OF ANTIBIOTIC BEADS INTO RIGHT FOOT ABSCESS;  Surgeon: Norleen Armor, MD;  Location: [facility] OR;  Service: Orthopedics;  Laterality: Right;  SITE ALSO  RIGHT FOOT   IRRIGATION AND DEBRIDEMENT FOOT Right 07/04/2015   Irrigation and excisional debridement of right foot deep abscess;; Insertion of antibiotic beads into the right foot abscess   JOINT REPLACEMENT     MAXIMUM ACCESS (MAS)POSTERIOR LUMBAR INTERBODY FUSION (PLIF) 1 LEVEL N/A 01/01/2017   Procedure: L4-5 MAXIMUM ACCESS (MAS) POSTERIOR LUMBAR INTERBODY FUSION (PLIF);  Surgeon: Joshua Alm RAMAN, MD;  Location: [facility] OR;  Service: Neurosurgery;  Laterality: N/A;   TOTAL HIP ARTHROPLASTY Left 07/30/2023   Procedure: LEFT TOTAL HIP ARTHROPLASTY ANTERIOR APPROACH;  Surgeon: Vernetta Lonni GRADE, MD;  Location: [facility] ORS;  Service: Orthopedics;  Laterality: Left;   TOTAL KNEE ARTHROPLASTY Left    WEIL OSTEOTOMY Left 06/06/2015   Procedure: LEFT TWO AND THREE METATARSAL WEIL OSTEOTOMY ;  Surgeon: Norleen Armor, MD;  Location: [facility];  Service: Orthopedics;  Laterality: Left;   Patient Active Problem List   Diagnosis Date Noted   Status post total replacement of left hip 07/30/2023   Atypical chest pain 07/27/2023  Nonspecific abnormal electrocardiogram (ECG) (EKG) 07/27/2023   Chronic left hip pain 04/13/2023   Metatarsal fracture 09/11/2021   S/P lumbar spinal fusion 01/01/2017   ADD (attention deficit disorder) 07/17/2015   Bipolar affective disorder (HCC) 07/17/2015   HLD (hyperlipidemia) 07/17/2015   BP (high blood pressure) 07/17/2015   Obstructive apnea 07/17/2015   Staphylococcus aureus infection    Foot osteomyelitis, right (HCC) 07/04/2015    PCP: Larnell Hamilton, MD  REFERRING PROVIDER: Vernetta  REFERRING DIAG:  S/P L THA  THERAPY DIAG:  Pain in right hip  Pain in left hip  Other low back pain  ONSET DATE:    SUBJECTIVE:                                                                                                                                                                                            SUBJECTIVE STATEMENT: Pain is up a little from my usual, I have been painting. Pain 3-4/10    POOL ACCESS: member of [facility]   Initial Subjective Pt had surgery on 07/30/23  for  L THA. Anterior approach. States longer recovery with compication- fx. Reports doing well initially. L hip doing well, no pain, feelf more even. Now that she is becoming more active, she is having increased pain. States most pain into bil low back and glutes. She states tightness in bil low back, increased with standing/walking and standing activity, and at end of the day.  She has been able to resume walking up to 30 min/day, walking helps to loosen up. She is only sleeping 3-4 hr/night due to waking up with pain.   She is not wearing heel lift at this time. She had some home health PT, but has not had any PT since then.     PERTINENT HISTORY:  L TKA,  Lumbar fusion L4/5 , previous foot and toe surgeries  PAIN:  Are you having pain? Yes NPRS scale:  2/10 an awareness it's there Pain location: bil glutes  and low back Pain description: intermittent ,sore, tight  Aggravating factors:  standing, standing  activity Relieving factors: rest    PRECAUTIONS: None  WEIGHT BEARING RESTRICTIONS No  FALLS:  Has patient fallen in last 6 months? No, Number of falls: 0  OCCUPATION: Retired,   PLOF: Independent  PATIENT GOALS  decreased pain in hips, back, be able to do more activity without pain.     OBJECTIVE:   DIAGNOSTIC FINDINGS:     COGNITION:  Overall cognitive status: Within functional limits for tasks assessed   POSTURE:  Standing: L knee in slight flexion Pelvic alignment much improved from  previous visits.     LOWER EXTREMITY ROM:    ROM AROM Eval-    Lumbar: WFL L knee: lacking full flexion and extension (from previous TKA) ; Limited DF  bil;  03/02/24 L hip: wfl   LOWER EXTREMITY MMT:   MMT Eval: Knees: 5/5 Hips: flex: 5/5,  abd: 4+/5 to 5/5.   10/2: hips flex 5/5, Abd 5- to 5/5 (re-measured 10/9)   LUMBAR SPECIAL TESTS:  Neg SLR bil;    GAIT: Decreased hip ext on L    TODAY'S TREATMENT  [facility] Adult PT Treatment:                                                DATE: 03/14/24 Pt seen for aquatic therapy today.  Treatment took place in water 3.5-4.75 ft in depth at the [facility] pool. Temp of water was 91.  Pt entered/exited the pool via stairs using alternating pattern with hand rail.   *unsupported walking forward, backward * side stepping with arm add/abdct with rainbow hand floats -> into wide squat with vertical trunk  *figure 4 stretch *quad and hip flex stretch at step *step ups tapping step with rider band resistance medial on stance leg for glute engagement.  Good challenge *straddling noodle: CKC knee flex/ext bilaterally then unilaterally->stool scoot on noodle forward and back using black noodle *PPT and hip hiking seated swing like on black noodle->rotation cw&ccw *CKC hip IR/ER 2 x 10 R/L     03/15/2024 Therapeutic Exercise:  Aerobic:  Bike L2 x 5 min Supine:  supine march with GTB  2 x 10;    bicycle(high) 2 x 8;    SKTC x 3 ; LTR x 10;  SLR x 10 with TA;   Seated:   S/L:  Prone:   cat /cow x 15;  modified childs pose x 5;  Standing:   Hip abd and ext 2 x 10 ea bil- review for HEP Stretches:  Neuromuscular Re-education:  Manual Therapy:    Therapeutic Activity:  Review for HEP- single leg step up and down on 6 in step x 10 bil;   Squats at wall x 5, with TRX strap x 10,  Rows Blue TB x 20;  review of strengthening for gym.  Self Care:     Therapeutic Exercise: review of HEP for lumbar flexion bias, core and hip strength.  Aerobic:   Supine:  supine march with TA 2 x 10; SKTC x 3 bil;  Seated:   S/L:  Prone:   cat /cow x 15;  bird dog- LEs only 2 x 5 bil;   Standing:    hip  ext  2 x 10 bil;  Stretches:  fig 4 hip - supine x 2 bil;  Neuromuscular Re-education:  Manual Therapy:   Therapeutic Activity:    Self Care: review of current symptoms, presentation, possible aquatic PT, and possible interventions in the future.     PATIENT EDUCATION:  Education details: updated and reviewed HEP  Person educated: Patient Education method: Explanation, Demonstration, Tactile cues, Verbal cues, and Handouts Education comprehension: verbalized understanding, returned demonstration, verbal cues required, tactile cues required, and needs further education   HOME EXERCISE PROGRAM: Access Code: [email or web address](previous) Access Code: [email or web address]  (previous)  Access Code: [email or web address] URL: [email or web address] Date: 11/01/2023 Prepared by: Tinnie Don  Exercises - Supine Lower Trunk Rotation  -  2 x daily - 10 reps - 5 hold - Quadruped Cat Cow  - 2 x daily - 10 reps - 5 sec hold - Straight Leg Raise  - 1 x daily - 5-6 x weekly - 2 sets - 10 reps - Sidelying Hip Abduction  - 1 x daily - 5-6 x weekly - 1-2 sets - 10 reps - Prone Hip Extension  - 1 x daily - 4-5 x weekly - 1 sets - 10 reps - Supine Bridge  - 1 x daily - 1-2 sets - 10 reps    ASSESSMENT:  CLINICAL IMPRESSION: Pt arrives with slightly high pain sensitivity due to extra house work she has completed.  Focus on stretching for deceased tightness throughout lumbo-pelvic area/hips. Continue to add to glute strengthening with good toleration. Progressed strengthening  of LE in 90d of hip flex.  No pain reported. Goals ongoing       Re-cert: Pt demonstrating improvements in all area of strength and ROM as noted above in charts.  She reports compliance with HEP.  She has had an overall reduction of pain but continues to be bothered by a low level awareness that it is there. She has some difficulty with sustaining a full hour of activity/standing but is able to complete with a slightly elevated pain  level. She will continue to benefit from skilled PT intervention in aquatic session to finalize HEP with instructions on using the properties of water to maintain and progress ROM, strength and manage pain.   Eval: Pt presents with deficits following L THA. She has Stiffness in L hip, with mild decrease in ROM. She has mild strength deficits as well. She has lack of effective HEP for ongoing deficits. She has much tightness and soreness in bil low back into bil glutes with increased standing activity. She does have improved posture and alignment since having THA, since being seen in PT last time. She will benefit from continued work on lumbar and hip mobility, achieving upright posture for decreasing pain. Pt with decreased ability for full functional activities and sustaining standing/walking activities. Pt to benefit from skilled PT to improve deficits and pain .    OBJECTIVE IMPAIRMENTS Abnormal gait, decreased activity tolerance, decreased balance, decreased knowledge of use of DME, decreased mobility, difficulty walking, decreased ROM, decreased strength, increased muscle spasms, impaired flexibility, improper body mechanics, and pain.   ACTIVITY LIMITATIONS meal prep, cleaning, laundry, shopping, community activity, and yard work.   PERSONAL FACTORS Past/current experiences /foot surgeries, L hip stiffness, leg length discrepancy, are effecting pts outcome.  REHAB POTENTIAL: Good  CLINICAL DECISION MAKING: Stable/uncomplicated  EVALUATION COMPLEXITY: Low    GOALS: Goals reviewed with patient? Yes  SHORT TERM GOALS: Target date: 11/22/2023   Patient to be independent with initial HEP  Goal status: MET    LONG TERM GOALS: Target date: 02/28/2024   Patient to be independent with final HEP  Goal status: in progress   2.  Patient to demo improved ROM of L hip and back , to be wfl to improve ability for more upright posture and gait.   Goal status:  Met 03/02/24  3.  Patient  to demo improved strength of  bil hips to 4+5,  to improve stability and pain  Goal status: Met 10/9  4.  Pt to report ability for sustaining at least 1 hours of standing/walking activity with pain 0-2/10, to improve ability for community activity.  Goal status: In progress    PLAN: PT FREQUENCY:  1-2x/week  PT DURATION: 3 weeks  PLANNED INTERVENTIONS: Therapeutic exercises, Therapeutic activity, Neuromuscular re-education, Balance training, Gait training, Patient/Family education, Self Care, Joint mobilization, Joint manipulation, Stair training, Orthotic/Fit training, DME instructions, Dry Needling, Electrical stimulation, Spinal mobilization, Moist heat, Taping, Traction, Ultrasound, Ionotophoresis 4mg /ml Dexamethasone , Manual therapy, and Neuro Muscular re-education  PLAN FOR NEXT SESSION:   see above.    Ronal Denham) Emmalou Hunger MPT 03/15/24 3:44 PM [facility] [address] [place], [place], [address] Phone: [phone]   Fax:  [phone]      .

## 2024-03-16 DIAGNOSIS — M5416 Radiculopathy, lumbar region: Secondary | ICD-10-CM | POA: Diagnosis not present

## 2024-03-16 DIAGNOSIS — M961 Postlaminectomy syndrome, not elsewhere classified: Secondary | ICD-10-CM | POA: Diagnosis not present

## 2024-03-20 ENCOUNTER — Ambulatory Visit (HOSPITAL_BASED_OUTPATIENT_CLINIC_OR_DEPARTMENT_OTHER): Payer: Self-pay | Admitting: Physical Therapy

## 2024-03-21 ENCOUNTER — Other Ambulatory Visit: Payer: Self-pay | Admitting: Medical Genetics

## 2024-03-21 DIAGNOSIS — Z006 Encounter for examination for normal comparison and control in clinical research program: Secondary | ICD-10-CM

## 2024-03-22 ENCOUNTER — Encounter (HOSPITAL_BASED_OUTPATIENT_CLINIC_OR_DEPARTMENT_OTHER): Payer: Self-pay | Admitting: Physical Therapy

## 2024-03-22 ENCOUNTER — Ambulatory Visit (HOSPITAL_BASED_OUTPATIENT_CLINIC_OR_DEPARTMENT_OTHER): Payer: Self-pay | Admitting: Physical Therapy

## 2024-03-22 DIAGNOSIS — M5459 Other low back pain: Secondary | ICD-10-CM

## 2024-03-22 DIAGNOSIS — M25551 Pain in right hip: Secondary | ICD-10-CM | POA: Diagnosis not present

## 2024-03-22 DIAGNOSIS — M25552 Pain in left hip: Secondary | ICD-10-CM

## 2024-03-22 NOTE — Therapy (Signed)
 OUTPATIENT PHYSICAL THERAPY TREATMENT PHYSICAL THERAPY DISCHARGE SUMMARY  Visits from Start of Care: 13  Current functional level related to goals / functional outcomes: indep   Remaining deficits: Right hip stiffness   Education / Equipment: Management of condition/HEP   Patient agrees to discharge. Patient goals were met. Patient is being discharged due to meeting the stated rehab goals.   Patient Name: Whitney Lewis MRN: [id number] DOB:December 04, 1960, 63 y.o., female Today's Date: 03/22/2024    PT End of Session - 03/22/24 1015     Visit Number 13    Number of Visits 16    Date for Recertification  03/24/24    PT Start Time 1016    PT Stop Time 1055    PT Time Calculation (min) 39 min    Activity Tolerance Patient tolerated treatment well    Behavior During Therapy [facility] for tasks assessed/performed                 Past Medical History:  Diagnosis Date   ADD (attention deficit disorder)    Anxiety    Arthritis    feet, knees (07/04/2015)   Bipolar disorder (HCC)    Complication of anesthesia    hard to wake up, blood pressure drops    Depression    Dysrhythmia    PVCs,   Family history of adverse reaction to anesthesia    sister hard to wake up also   Fibromyalgia    GERD (gastroesophageal reflux disease)    Hyperlipidemia    Hypertension    Migraine    hormonal; stopped when I was 16 (07/04/2015)   OSA (obstructive sleep apnea)    wears mouth piece, no CPAP   Seizures (HCC)    Past Surgical History:  Procedure Laterality Date   ABDOMINAL HYSTERECTOMY  ~ 1993   CESAREAN SECTION  1987   COLONOSCOPY     COLONOSCOPY WITH PROPOFOL  N/A 09/21/2017   Procedure: COLONOSCOPY WITH PROPOFOL ;  Surgeon: Gaylyn Gladis PENNER, MD;  Location: [facility];  Service: Endoscopy;  Laterality: N/A;   EYE SURGERY     gets injections in left retina, 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   HAMMER TOE SURGERY Right 03/2015   HAMMER TOE SURGERY Left 06/06/2015    Procedure: LEFT TWO-THREE  HAMMER TOE CORRECTION ;  Surgeon: Norleen Armor, MD;  Location: [facility];  Service: Orthopedics;  Laterality: Left;   INCISION AND DRAINAGE OF WOUND Right 07/04/2015   Procedure: IRRIGATION AND DEBRIDEMENT OF RIGHT FOOT DEEP ABSCESS; EXCISION OF RIGHT SECOND METATARSAL HEAD; REMOVAL OF DEEP IMPLANT RIGHT FOOT; EXCISION OF RIGHT SECOND TOE PROXIMAL PHALANX BASE; INSERTION OF ANTIBIOTIC BEADS INTO RIGHT FOOT ABSCESS;  Surgeon: Norleen Armor, MD;  Location: [facility] OR;  Service: Orthopedics;  Laterality: Right;  SITE ALSO  RIGHT FOOT   IRRIGATION AND DEBRIDEMENT FOOT Right 07/04/2015   Irrigation and excisional debridement of right foot deep abscess;; Insertion of antibiotic beads into the right foot abscess   JOINT REPLACEMENT     MAXIMUM ACCESS (MAS)POSTERIOR LUMBAR INTERBODY FUSION (PLIF) 1 LEVEL N/A 01/01/2017   Procedure: L4-5 MAXIMUM ACCESS (MAS) POSTERIOR LUMBAR INTERBODY FUSION (PLIF);  Surgeon: Joshua Alm RAMAN, MD;  Location: [facility] OR;  Service: Neurosurgery;  Laterality: N/A;   TOTAL HIP ARTHROPLASTY Left 07/30/2023   Procedure: LEFT TOTAL HIP ARTHROPLASTY ANTERIOR APPROACH;  Surgeon: Vernetta Lonni GRADE, MD;  Location: [facility];  Service: Orthopedics;  Laterality: Left;   TOTAL KNEE ARTHROPLASTY Left    WEIL  OSTEOTOMY Left 06/06/2015   Procedure: LEFT TWO AND THREE METATARSAL WEIL OSTEOTOMY ;  Surgeon: Norleen Armor, MD;  Location: [facility];  Service: Orthopedics;  Laterality: Left;   Patient Active Problem List   Diagnosis Date Noted   Status post total replacement of left hip 07/30/2023   Atypical chest pain 07/27/2023   Nonspecific abnormal electrocardiogram (ECG) (EKG) 07/27/2023   Chronic left hip pain 04/13/2023   Metatarsal fracture 09/11/2021   S/P lumbar spinal fusion 01/01/2017   ADD (attention deficit disorder) 07/17/2015   Bipolar affective disorder (HCC) 07/17/2015   HLD (hyperlipidemia) 07/17/2015   BP (high blood  pressure) 07/17/2015   Obstructive apnea 07/17/2015   Staphylococcus aureus infection    Foot osteomyelitis, right (HCC) 07/04/2015    PCP: Larnell Hamilton, MD  REFERRING PROVIDER: Vernetta  REFERRING DIAG:  S/P L THA  THERAPY DIAG:  Pain in right hip  Pain in left hip  Other low back pain  ONSET DATE:    SUBJECTIVE:                                                                                                                                                                                           SUBJECTIVE STATEMENT: feel much better today than last session pain 0-1/10 hip    POOL ACCESS: member of [facility]   Initial Subjective Pt had surgery on 07/30/23  for  L THA. Anterior approach. States longer recovery with compication- fx. Reports doing well initially. L hip doing well, no pain, feelf more even. Now that she is becoming more active, she is having increased pain. States most pain into bil low back and glutes. She states tightness in bil low back, increased with standing/walking and standing activity, and at end of the day.  She has been able to resume walking up to 30 min/day, walking helps to loosen up. She is only sleeping 3-4 hr/night due to waking up with pain.   She is not wearing heel lift at this time. She had some home health PT, but has not had any PT since then.     PERTINENT HISTORY:  L TKA,  Lumbar fusion L4/5 , previous foot and toe surgeries  PAIN:  Are you having pain? Yes NPRS scale:  2/10 an awareness it's there Pain location: bil glutes  and low back Pain description: intermittent ,sore, tight  Aggravating factors:  standing, standing  activity Relieving factors: rest    PRECAUTIONS: None  WEIGHT BEARING RESTRICTIONS No  FALLS:  Has patient fallen in last 6 months? No, Number of falls: 0  OCCUPATION: Retired,   PLOF: Independent  PATIENT GOALS  decreased pain in hips, back, be able to do more activity without pain.      OBJECTIVE:   DIAGNOSTIC FINDINGS:     COGNITION:  Overall cognitive status: Within functional limits for tasks assessed   POSTURE:  Standing: L knee in slight flexion Pelvic alignment much improved from previous visits.     LOWER EXTREMITY ROM:    ROM AROM Eval-    Lumbar: WFL L knee: lacking full flexion and extension (from previous TKA) ; Limited DF bil;  03/02/24 L hip: wfl   LOWER EXTREMITY MMT:   MMT Eval: Knees: 5/5 Hips: flex: 5/5,  abd: 4+/5 to 5/5.   10/2: hips flex 5/5, Abd 5- to 5/5 (re-measured 10/9)   LUMBAR SPECIAL TESTS:  Neg SLR bil;    GAIT: Decreased hip ext on L    TODAY'S TREATMENT  [facility] Adult PT Treatment:                                                DATE: 03/22/24 Pt seen for aquatic therapy today.  Treatment took place in water 3.5-4.75 ft in depth at the [facility] pool. Temp of water was 91.  Pt entered/exited the pool via stairs using alternating pattern with hand rail.   Exercises - Side Stepping with Hand Floats   - figure 4 stretch on wall or steps   - Quadriceps Stretch with Table   - Runner's Step Up/Down  - Pelvic Tilt on Swiss Ball  - Seated Swiss Ball Lateral Pelvic Tilts   - Stool Scoots  - Standing Hip Internal Rotation Stretch       03/22/2024 Therapeutic Exercise:  Aerobic:  Bike L2 x 5 min Supine:  supine march with GTB  2 x 10;    bicycle(high) 2 x 8;    SKTC x 3 ; LTR x 10;  SLR x 10 with TA;   Seated:   S/L:  Prone:   cat /cow x 15;  modified childs pose x 5;  Standing:   Hip abd and ext 2 x 10 ea bil- review for HEP Stretches:  Neuromuscular Re-education:  Manual Therapy:    Therapeutic Activity:  Review for HEP- single leg step up and down on 6 in step x 10 bil;   Squats at wall x 5, with TRX strap x 10,  Rows Blue TB x 20;  review of strengthening for gym.  Self Care:     Therapeutic Exercise: review of HEP for lumbar flexion bias, core and hip strength.  Aerobic:   Supine:  supine  march with TA 2 x 10; SKTC x 3 bil;  Seated:   S/L:  Prone:   cat /cow x 15;  bird dog- LEs only 2 x 5 bil;   Standing:   hip  ext  2 x 10 bil;  Stretches:  fig 4 hip - supine x 2 bil;  Neuromuscular Re-education:  Manual Therapy:   Therapeutic Activity:    Self Care: review of current symptoms, presentation, possible aquatic PT, and possible interventions in the future.     PATIENT EDUCATION:  Education details: updated and reviewed HEP  Person educated: Patient Education method: Explanation, Demonstration, Tactile cues, Verbal cues, and Handouts Education comprehension: verbalized understanding, returned demonstration, verbal cues required, tactile cues required, and needs further education   HOME EXERCISE  PROGRAM: Access Code: [email or web address](previous) Access Code: [email or web address]  (previous)  Access Code: [email or web address] URL: [email or web address] Date: 11/01/2023 Prepared by: Tinnie Don  Exercises - Supine Lower Trunk Rotation  - 2 x daily - 10 reps - 5 hold - Quadruped Cat Cow  - 2 x daily - 10 reps - 5 sec hold - Straight Leg Raise  - 1 x daily - 5-6 x weekly - 2 sets - 10 reps - Sidelying Hip Abduction  - 1 x daily - 5-6 x weekly - 1-2 sets - 10 reps - Prone Hip Extension  - 1 x daily - 4-5 x weekly - 1 sets - 10 reps - Supine Bridge  - 1 x daily - 1-2 sets - 10 reps  Access Code: [email or web address] URL: [email or web address] Date: 03/22/2024 Prepared by: Frankie Karrigan Messamore  Exercises - Side Stepping with Hand Floats  - 1 x daily - 1-3 x weekly - figure 4 stretch on wall or steps  - 1 x daily - 1-3 x weekly - 1 sets - 3 reps - 20s hold - Quadriceps Stretch with Table  - 1 x daily - 1-3 x weekly - 3 sets - 10 reps - 20s hold - Runner's Step Up/Down  - 1 x daily - 7 x weekly - 3 sets - 10 reps - Pelvic Tilt on Swiss Ball  - 1 x daily - 1-3 x weekly - 1-2 sets - 5-10 reps - Seated Swiss Ball Lateral Pelvic Tilts  - 1 x daily - 1-3 x weekly - 1-2 sets - 5-10 reps -  Stool Scoots  - 1 x daily - 1-3 x weekly - Standing Hip Internal Rotation Stretch  - 1 x daily - 1-3 x weekly - 1-2 sets - 10 reps  ASSESSMENT:  CLINICAL IMPRESSION: Pt arrives for final session. Missed prior session so HEP created and issued today. She is directed through with instruction on variations to increase/decrease challenge as her needs vary.  She demonstrates and vu understanding. Minor written clarification made to program. Minor vc for execution given. She reports good toleration to session feeling the best she has. Pt has met all goals.  As per her hip she has no limitations.  She is reporting some limitations due to an increasing dysfunction with a lumbar condition. She has reached her max potential in setting and is ready for DC.        OBJECTIVE IMPAIRMENTS Abnormal gait, decreased activity tolerance, decreased balance, decreased knowledge of use of DME, decreased mobility, difficulty walking, decreased ROM, decreased strength, increased muscle spasms, impaired flexibility, improper body mechanics, and pain.   ACTIVITY LIMITATIONS meal prep, cleaning, laundry, shopping, community activity, and yard work.   PERSONAL FACTORS Past/current experiences /foot surgeries, L hip stiffness, leg length discrepancy, are effecting pts outcome.  REHAB POTENTIAL: Good  CLINICAL DECISION MAKING: Stable/uncomplicated  EVALUATION COMPLEXITY: Low    GOALS: Goals reviewed with patient? Yes  SHORT TERM GOALS: Target date: 11/22/2023   Patient to be independent with initial HEP  Goal status: MET    LONG TERM GOALS: Target date: 02/28/2024   Patient to be independent with final HEP  Goal status: Met 03/22/24  2.  Patient to demo improved ROM of L hip and back , to be wfl to improve ability for more upright posture and gait.   Goal status:  Met 03/02/24  3.  Patient to demo improved strength of  bil hips to 4+5,  to improve stability and pain  Goal status: Met 10/9  4.   Pt to report ability for sustaining at least 1 hours of standing/walking activity with pain 0-2/10, to improve ability for community activity.  Goal status: Met 03/22/24   PLAN: PT FREQUENCY: 1-2x/week  PT DURATION: 3 weeks  PLANNED INTERVENTIONS: Therapeutic exercises, Therapeutic activity, Neuromuscular re-education, Balance training, Gait training, Patient/Family education, Self Care, Joint mobilization, Joint manipulation, Stair training, Orthotic/Fit training, DME instructions, Dry Needling, Electrical stimulation, Spinal mobilization, Moist heat, Taping, Traction, Ultrasound, Ionotophoresis 4mg /ml Dexamethasone , Manual therapy, and Neuro Muscular re-education  PLAN FOR NEXT SESSION:   see above.    Ronal Hendrum) Margy Sumler MPT 03/22/24 10:58 AM [facility] [address] [place], [place], [address] Phone: [phone]   Fax:  [phone]      .

## 2024-03-27 ENCOUNTER — Encounter: Payer: Self-pay | Admitting: Radiology

## 2024-03-27 DIAGNOSIS — H2513 Age-related nuclear cataract, bilateral: Secondary | ICD-10-CM | POA: Diagnosis not present

## 2024-03-27 DIAGNOSIS — D3132 Benign neoplasm of left choroid: Secondary | ICD-10-CM | POA: Diagnosis not present

## 2024-03-27 DIAGNOSIS — H35373 Puckering of macula, bilateral: Secondary | ICD-10-CM | POA: Diagnosis not present

## 2024-03-27 DIAGNOSIS — H43813 Vitreous degeneration, bilateral: Secondary | ICD-10-CM | POA: Diagnosis not present

## 2024-03-27 DIAGNOSIS — H35033 Hypertensive retinopathy, bilateral: Secondary | ICD-10-CM | POA: Diagnosis not present

## 2024-03-27 DIAGNOSIS — H34812 Central retinal vein occlusion, left eye, with macular edema: Secondary | ICD-10-CM | POA: Diagnosis not present

## 2024-03-29 DIAGNOSIS — F3161 Bipolar disorder, current episode mixed, mild: Secondary | ICD-10-CM | POA: Diagnosis not present

## 2024-04-06 DIAGNOSIS — M48062 Spinal stenosis, lumbar region with neurogenic claudication: Secondary | ICD-10-CM | POA: Diagnosis not present

## 2024-04-13 DIAGNOSIS — M5416 Radiculopathy, lumbar region: Secondary | ICD-10-CM | POA: Diagnosis not present

## 2024-04-18 DIAGNOSIS — F3161 Bipolar disorder, current episode mixed, mild: Secondary | ICD-10-CM | POA: Diagnosis not present

## 2024-04-29 LAB — GENECONNECT MOLECULAR SCREEN: Genetic Analysis Overall Interpretation: NEGATIVE

## 2024-05-02 DIAGNOSIS — M48062 Spinal stenosis, lumbar region with neurogenic claudication: Secondary | ICD-10-CM | POA: Diagnosis not present

## 2024-06-16 ENCOUNTER — Other Ambulatory Visit: Payer: Self-pay | Admitting: Neurological Surgery

## 2024-06-16 ENCOUNTER — Other Ambulatory Visit: Payer: Self-pay

## 2024-06-16 ENCOUNTER — Encounter (HOSPITAL_COMMUNITY): Payer: Self-pay | Admitting: Neurological Surgery

## 2024-06-16 NOTE — Progress Notes (Signed)
 PCP - Larnell Hamilton, MD  Cardiologist -   PPM/ICD - denies Device Orders - n/a Rep Notified - n/a  Chest x-ray - denies EKG - 07-27-23 Stress Test - denies ECHO - 05-14-23 Cardiac Cath - denies  CPAP - patient had previously used a mouth piece for sleep apnea. Per patient she no longer use it, she reports she lost 60 lbs. After starting a mediation that caused her to gain 70 lbs  DM - denies  Blood Thinner Instructions: denies Aspirin  Instructions: per patient she no longer takes  ERAS Protcol - Clear liquids until  10:15 am.   COVID TEST- n/a  Anesthesia review: Yes, Hx of OSA, HTN  Patient verbally denies any shortness of breath, fever, cough and chest pain during phone call   -------------  SDW INSTRUCTIONS given:  Your procedure is scheduled on June 19, 2024.  Report to [facility] Main Entrance A at 10:45 A.M., and check in at the Admitting office.  Call this number if you have problems the morning of surgery:  [phone]   Remember:  Do not eat after midnight the night before your surgery  You may drink clear liquids until 10:15 the morning of your surgery.   Clear liquids allowed are: Water, Non-Citrus Juices (without pulp), Carbonated Beverages, Clear Tea, Black Coffee Only, and Gatorade    Take these medicines the morning of surgery with A SIP OF WATER  cycloSPORINE  (RESTASIS )  oxyCODONE  (OXY IR/ROXICODONE )  sodium chloride   nasal spray     As of today, STOP taking any Aspirin  (unless otherwise instructed by your surgeon) Aleve, Naproxen, Ibuprofen, Motrin, Advil, Goody's, BC's, all herbal medications, fish oil, and all vitamins.                      Do not wear jewelry, make up, or nail polish            Do not wear lotions, powders, perfumes/colognes, or deodorant.            Do not shave 48 hours prior to surgery.  Men may shave face and neck.            Do not bring valuables to the hospital.            [facility] is not responsible for any  belongings or valuables.  Do NOT Smoke (Tobacco/Vaping) 24 hours prior to your procedure If you use a CPAP at night, you may bring all equipment for your overnight stay.   Contacts, glasses, dentures or bridgework may not be worn into surgery.      For patients admitted to the hospital, discharge time will be determined by your treatment team.   Patients discharged the day of surgery will not be allowed to drive home, and someone needs to stay with them for 24 hours.    Special instructions:   [facility]- Preparing For Surgery  Before surgery, you can play an important role. Because skin is not sterile, your skin needs to be as free of germs as possible. You can reduce the number of germs on your skin by washing with CHG (chlorahexidine gluconate) Soap before surgery.  CHG is an antiseptic cleaner which kills germs and bonds with the skin to continue killing germs even after washing.    Oral Hygiene is also important to reduce your risk of infection.  Remember - BRUSH YOUR TEETH THE MORNING OF SURGERY WITH YOUR REGULAR TOOTHPASTE  Please do not use if you  have an allergy to CHG or antibacterial soaps. If your skin becomes reddened/irritated stop using the CHG.  Do not shave (including legs and underarms) for at least 48 hours prior to first CHG shower. It is OK to shave your face.  Please follow these instructions carefully.   Shower the NIGHT BEFORE SURGERY and the MORNING OF SURGERY with DIAL Soap.   Pat yourself dry with a CLEAN TOWEL.  Wear CLEAN PAJAMAS to bed the night before surgery  Place CLEAN SHEETS on your bed the night of your first shower and DO NOT SLEEP WITH PETS.   Day of Surgery: Please shower morning of surgery  Wear Clean/Comfortable clothing the morning of surgery Do not apply any deodorants/lotions.   Remember to brush your teeth WITH YOUR REGULAR TOOTHPASTE.   Questions were answered. Patient verbalized understanding of instructions.

## 2024-06-19 ENCOUNTER — Ambulatory Visit (HOSPITAL_COMMUNITY): Admitting: Anesthesiology

## 2024-06-19 ENCOUNTER — Ambulatory Visit (HOSPITAL_COMMUNITY)

## 2024-06-19 ENCOUNTER — Other Ambulatory Visit: Payer: Self-pay

## 2024-06-19 ENCOUNTER — Encounter (HOSPITAL_COMMUNITY): Payer: Self-pay | Admitting: Neurological Surgery

## 2024-06-19 ENCOUNTER — Encounter (HOSPITAL_COMMUNITY): Admission: RE | Disposition: A | Payer: Self-pay | Source: Home / Self Care | Attending: Neurological Surgery

## 2024-06-19 ENCOUNTER — Observation Stay (HOSPITAL_COMMUNITY)
Admission: RE | Admit: 2024-06-19 | Discharge: 2024-06-20 | Disposition: A | Attending: Neurological Surgery | Admitting: Neurological Surgery

## 2024-06-19 DIAGNOSIS — Z96642 Presence of left artificial hip joint: Secondary | ICD-10-CM | POA: Insufficient documentation

## 2024-06-19 DIAGNOSIS — I10 Essential (primary) hypertension: Secondary | ICD-10-CM | POA: Insufficient documentation

## 2024-06-19 DIAGNOSIS — Z96652 Presence of left artificial knee joint: Secondary | ICD-10-CM | POA: Insufficient documentation

## 2024-06-19 DIAGNOSIS — M48061 Spinal stenosis, lumbar region without neurogenic claudication: Secondary | ICD-10-CM | POA: Diagnosis not present

## 2024-06-19 DIAGNOSIS — M4726 Other spondylosis with radiculopathy, lumbar region: Secondary | ICD-10-CM | POA: Insufficient documentation

## 2024-06-19 DIAGNOSIS — M48062 Spinal stenosis, lumbar region with neurogenic claudication: Principal | ICD-10-CM | POA: Insufficient documentation

## 2024-06-19 DIAGNOSIS — Z79899 Other long term (current) drug therapy: Secondary | ICD-10-CM | POA: Insufficient documentation

## 2024-06-19 DIAGNOSIS — Z981 Arthrodesis status: Principal | ICD-10-CM

## 2024-06-19 DIAGNOSIS — M47816 Spondylosis without myelopathy or radiculopathy, lumbar region: Secondary | ICD-10-CM | POA: Insufficient documentation

## 2024-06-19 LAB — CBC
HCT: 36 % (ref 36.0–46.0)
Hemoglobin: 13.3 g/dL (ref 12.0–15.0)
MCH: 31.7 pg (ref 26.0–34.0)
MCHC: 36.9 g/dL — ABNORMAL HIGH (ref 30.0–36.0)
MCV: 85.7 fL (ref 80.0–100.0)
Platelets: 258 10*3/uL (ref 150–400)
RBC: 4.2 MIL/uL (ref 3.87–5.11)
RDW: 11.2 % — ABNORMAL LOW (ref 11.5–15.5)
WBC: 5.3 10*3/uL (ref 4.0–10.5)
nRBC: 0 % (ref 0.0–0.2)

## 2024-06-19 LAB — BASIC METABOLIC PANEL WITH GFR
Anion gap: 14 (ref 5–15)
BUN: 11 mg/dL (ref 8–23)
CO2: 21 mmol/L — ABNORMAL LOW (ref 22–32)
Calcium: 9.1 mg/dL (ref 8.9–10.3)
Chloride: 102 mmol/L (ref 98–111)
Creatinine, Ser: 0.6 mg/dL (ref 0.44–1.00)
GFR, Estimated: >60 mL/min
Glucose, Bld: 98 mg/dL (ref 70–99)
Potassium: 3.8 mmol/L (ref 3.5–5.1)
Sodium: 137 mmol/L (ref 135–145)

## 2024-06-19 LAB — TYPE AND SCREEN
ABO/RH(D): AB NEG
Antibody Screen: NEGATIVE

## 2024-06-19 LAB — SURGICAL PCR SCREEN
MRSA, PCR: NEGATIVE
Staphylococcus aureus: POSITIVE — AB

## 2024-06-19 MED ORDER — PHENYLEPHRINE 80 MCG/ML (10ML) SYRINGE FOR IV PUSH (FOR BLOOD PRESSURE SUPPORT)
PREFILLED_SYRINGE | INTRAVENOUS | Status: DC | PRN
  Administered 2024-06-19: 80 ug via INTRAVENOUS

## 2024-06-19 MED ORDER — THROMBIN 20000 UNITS EX SOLR
CUTANEOUS | Status: AC
  Filled 2024-06-19: qty 20000

## 2024-06-19 MED ORDER — ONDANSETRON HCL 4 MG PO TABS: 4.0000 mg | ORAL_TABLET | Freq: Four times a day (QID) | ORAL | Status: DC | PRN

## 2024-06-19 MED ORDER — PROPOFOL 10 MG/ML IV BOLUS
INTRAVENOUS | Status: DC | PRN
  Administered 2024-06-19: 20 mg via INTRAVENOUS
  Administered 2024-06-19: 90 mg via INTRAVENOUS

## 2024-06-19 MED ORDER — CEFAZOLIN SODIUM 1 G IJ SOLR
INTRAMUSCULAR | Status: AC
  Filled 2024-06-19: qty 20

## 2024-06-19 MED ORDER — FENTANYL CITRATE (PF) 100 MCG/2ML IJ SOLN
INTRAMUSCULAR | Status: AC
  Filled 2024-06-19: qty 2

## 2024-06-19 MED ORDER — SURGIRINSE WOUND IRRIGATION SYSTEM - OPTIME: TOPICAL | Status: DC | PRN

## 2024-06-19 MED ORDER — VANCOMYCIN HCL IN DEXTROSE 1-5 GM/200ML-% IV SOLN
1000.0000 mg | Freq: Once | INTRAVENOUS | Status: AC
  Administered 2024-06-19: 1000 mg via INTRAVENOUS
  Filled 2024-06-19: qty 200

## 2024-06-19 MED ORDER — THROMBIN 5000 UNITS EX SOLR
OROMUCOSAL | Status: DC | PRN
  Administered 2024-06-19: 5 mL via TOPICAL

## 2024-06-19 MED ORDER — CHLORHEXIDINE GLUCONATE 4 % EX SOLN: 1.0000 | CUTANEOUS | 1 refills | Status: AC

## 2024-06-19 MED ORDER — HYDROMORPHONE HCL 1 MG/ML IJ SOLN
INTRAMUSCULAR | Status: DC | PRN
  Administered 2024-06-19 (×3): .5 mg via INTRAVENOUS

## 2024-06-19 MED ORDER — ROCURONIUM BROMIDE 10 MG/ML (PF) SYRINGE
PREFILLED_SYRINGE | INTRAVENOUS | Status: AC
  Filled 2024-06-19: qty 20

## 2024-06-19 MED ORDER — CHLORHEXIDINE GLUCONATE CLOTH 2 % EX PADS: 6.0000 | MEDICATED_PAD | Freq: Once | CUTANEOUS | Status: DC

## 2024-06-19 MED ORDER — LIDOCAINE 2% (20 MG/ML) 5 ML SYRINGE
INTRAMUSCULAR | Status: DC | PRN
  Administered 2024-06-19: 60 mg via INTRAVENOUS

## 2024-06-19 MED ORDER — CEFAZOLIN SODIUM-DEXTROSE 2-4 GM/100ML-% IV SOLN: 2.0000 g | INTRAVENOUS | Status: DC

## 2024-06-19 MED ORDER — HYDROCHLOROTHIAZIDE 12.5 MG PO TABS
12.5000 mg | ORAL_TABLET | Freq: Every day | ORAL | Status: DC
  Administered 2024-06-19: 12.5 mg via ORAL
  Filled 2024-06-19: qty 1

## 2024-06-19 MED ORDER — ORAL CARE MOUTH RINSE: 15.0000 mL | Freq: Once | OROMUCOSAL | Status: AC

## 2024-06-19 MED ORDER — DEXAMETHASONE 4 MG PO TABS
4.0000 mg | ORAL_TABLET | Freq: Four times a day (QID) | ORAL | Status: DC
  Administered 2024-06-19 – 2024-06-20 (×3): 4 mg via ORAL
  Filled 2024-06-19 (×3): qty 1

## 2024-06-19 MED ORDER — VALSARTAN-HYDROCHLOROTHIAZIDE 160-12.5 MG PO TABS: 1.0000 | ORAL_TABLET | Freq: Every day | ORAL | Status: DC

## 2024-06-19 MED ORDER — MIDAZOLAM HCL 2 MG/2ML IJ SOLN
INTRAMUSCULAR | Status: AC
  Filled 2024-06-19: qty 2

## 2024-06-19 MED ORDER — GABAPENTIN 300 MG PO CAPS
300.0000 mg | ORAL_CAPSULE | ORAL | Status: AC
  Administered 2024-06-19: 300 mg via ORAL
  Filled 2024-06-19: qty 1

## 2024-06-19 MED ORDER — HYDROCODONE-ACETAMINOPHEN 5-325 MG PO TABS: 1.0000 | ORAL_TABLET | ORAL | Status: DC | PRN

## 2024-06-19 MED ORDER — CYCLOSPORINE 0.05 % OP EMUL
1.0000 [drp] | Freq: Two times a day (BID) | OPHTHALMIC | Status: DC
  Administered 2024-06-19: 1 [drp] via OPHTHALMIC
  Filled 2024-06-19: qty 1

## 2024-06-19 MED ORDER — MUPIROCIN 2 % EX OINT
1.0000 | TOPICAL_OINTMENT | Freq: Two times a day (BID) | CUTANEOUS | Status: DC
  Administered 2024-06-19 – 2024-06-20 (×2): 1 via NASAL
  Filled 2024-06-19: qty 22

## 2024-06-19 MED ORDER — METHOCARBAMOL 1000 MG/10ML IJ SOLN: 500.0000 mg | Freq: Four times a day (QID) | INTRAMUSCULAR | Status: DC | PRN

## 2024-06-19 MED ORDER — SODIUM CHLORIDE 0.9 % IV SOLN
250.0000 mL | INTRAVENOUS | Status: DC
  Administered 2024-06-19: 250 mL via INTRAVENOUS

## 2024-06-19 MED ORDER — ONDANSETRON HCL 4 MG/2ML IJ SOLN
INTRAMUSCULAR | Status: DC | PRN
  Administered 2024-06-19: 4 mg via INTRAVENOUS

## 2024-06-19 MED ORDER — 0.9 % SODIUM CHLORIDE (POUR BTL) OPTIME
TOPICAL | Status: DC | PRN
  Administered 2024-06-19: 1000 mL

## 2024-06-19 MED ORDER — THROMBIN 5000 UNITS EX KIT
PACK | CUTANEOUS | Status: AC
  Filled 2024-06-19: qty 1

## 2024-06-19 MED ORDER — ACETAMINOPHEN 500 MG PO TABS
1000.0000 mg | ORAL_TABLET | Freq: Four times a day (QID) | ORAL | Status: AC
  Administered 2024-06-19 – 2024-06-20 (×4): 1000 mg via ORAL
  Filled 2024-06-19 (×4): qty 2

## 2024-06-19 MED ORDER — LACTATED RINGERS IV SOLN: INTRAVENOUS | Status: DC

## 2024-06-19 MED ORDER — THROMBIN 20000 UNITS EX SOLR
CUTANEOUS | Status: DC | PRN
  Administered 2024-06-19: 20 mL via TOPICAL

## 2024-06-19 MED ORDER — CHLORHEXIDINE GLUCONATE 0.12 % MT SOLN
15.0000 mL | Freq: Once | OROMUCOSAL | Status: AC
  Administered 2024-06-19: 15 mL via OROMUCOSAL
  Filled 2024-06-19: qty 15

## 2024-06-19 MED ORDER — BUPIVACAINE HCL (PF) 0.25 % IJ SOLN
INTRAMUSCULAR | Status: AC
  Filled 2024-06-19: qty 30

## 2024-06-19 MED ORDER — ACETAMINOPHEN 500 MG PO TABS
1000.0000 mg | ORAL_TABLET | ORAL | Status: AC
  Administered 2024-06-19: 1000 mg via ORAL
  Filled 2024-06-19: qty 2

## 2024-06-19 MED ORDER — METHOCARBAMOL 500 MG PO TABS
500.0000 mg | ORAL_TABLET | Freq: Four times a day (QID) | ORAL | Status: DC | PRN
  Administered 2024-06-19 – 2024-06-20 (×3): 500 mg via ORAL
  Filled 2024-06-19 (×3): qty 1

## 2024-06-19 MED ORDER — PHENOL 1.4 % MT LIQD: 1.0000 | OROMUCOSAL | Status: DC | PRN

## 2024-06-19 MED ORDER — MENTHOL 3 MG MT LOZG: 1.0000 | LOZENGE | OROMUCOSAL | Status: DC | PRN

## 2024-06-19 MED ORDER — SENNA 8.6 MG PO TABS
1.0000 | ORAL_TABLET | Freq: Two times a day (BID) | ORAL | Status: DC
  Administered 2024-06-19 – 2024-06-20 (×2): 8.6 mg via ORAL
  Filled 2024-06-19 (×2): qty 1

## 2024-06-19 MED ORDER — DEXAMETHASONE SOD PHOSPHATE PF 10 MG/ML IJ SOLN
INTRAMUSCULAR | Status: DC | PRN
  Administered 2024-06-19: 10 mg via INTRAVENOUS

## 2024-06-19 MED ORDER — FENTANYL CITRATE (PF) 250 MCG/5ML IJ SOLN: INTRAMUSCULAR | Status: DC | PRN

## 2024-06-19 MED ORDER — AMISULPRIDE (ANTIEMETIC) 5 MG/2ML IV SOLN: 10.0000 mg | Freq: Once | INTRAVENOUS | Status: DC | PRN

## 2024-06-19 MED ORDER — SUGAMMADEX SODIUM 200 MG/2ML IV SOLN
INTRAVENOUS | Status: DC | PRN
  Administered 2024-06-19: 300 mg via INTRAVENOUS

## 2024-06-19 MED ORDER — GLYCOPYRROLATE PF 0.2 MG/ML IJ SOSY
PREFILLED_SYRINGE | INTRAMUSCULAR | Status: DC | PRN
  Administered 2024-06-19: .2 mg via INTRAVENOUS

## 2024-06-19 MED ORDER — ONDANSETRON HCL 4 MG/2ML IJ SOLN: 4.0000 mg | Freq: Once | INTRAMUSCULAR | Status: DC | PRN

## 2024-06-19 MED ORDER — BUPIVACAINE HCL (PF) 0.25 % IJ SOLN
INTRAMUSCULAR | Status: DC | PRN
  Administered 2024-06-19: 7 mL

## 2024-06-19 MED ORDER — IRBESARTAN 150 MG PO TABS
150.0000 mg | ORAL_TABLET | Freq: Every day | ORAL | Status: DC
  Administered 2024-06-19: 150 mg via ORAL
  Filled 2024-06-19: qty 1

## 2024-06-19 MED ORDER — SODIUM CHLORIDE 0.9% FLUSH: 3.0000 mL | INTRAVENOUS | Status: DC | PRN

## 2024-06-19 MED ORDER — MIDAZOLAM HCL (PF) 2 MG/2ML IJ SOLN
INTRAMUSCULAR | Status: DC | PRN
  Administered 2024-06-19: 2 mg via INTRAVENOUS

## 2024-06-19 MED ORDER — OXYCODONE HCL 5 MG PO TABS
5.0000 mg | ORAL_TABLET | ORAL | Status: DC | PRN
  Administered 2024-06-19 – 2024-06-20 (×4): 5 mg via ORAL
  Filled 2024-06-19 (×4): qty 1

## 2024-06-19 MED ORDER — ONDANSETRON HCL 4 MG/2ML IJ SOLN: 4.0000 mg | Freq: Four times a day (QID) | INTRAMUSCULAR | Status: DC | PRN

## 2024-06-19 MED ORDER — EPHEDRINE SULFATE-NACL 50-0.9 MG/10ML-% IV SOSY
PREFILLED_SYRINGE | INTRAVENOUS | Status: DC | PRN
  Administered 2024-06-19: 5 mg via INTRAVENOUS

## 2024-06-19 MED ORDER — SODIUM CHLORIDE 0.9% FLUSH
3.0000 mL | Freq: Two times a day (BID) | INTRAVENOUS | Status: DC
  Administered 2024-06-19: 3 mL via INTRAVENOUS

## 2024-06-19 MED ORDER — CHLORHEXIDINE GLUCONATE CLOTH 2 % EX PADS
6.0000 | MEDICATED_PAD | Freq: Every day | CUTANEOUS | Status: DC
  Administered 2024-06-19 – 2024-06-20 (×2): 6 via TOPICAL

## 2024-06-19 MED ORDER — ALBUMIN HUMAN 5 % IV SOLN: INTRAVENOUS | Status: DC | PRN

## 2024-06-19 MED ORDER — ROCURONIUM BROMIDE 10 MG/ML (PF) SYRINGE
PREFILLED_SYRINGE | INTRAVENOUS | Status: DC | PRN
  Administered 2024-06-19: 20 mg via INTRAVENOUS
  Administered 2024-06-19: 70 mg via INTRAVENOUS
  Administered 2024-06-19: 10 mg via INTRAVENOUS

## 2024-06-19 MED ORDER — FENTANYL CITRATE (PF) 100 MCG/2ML IJ SOLN
INTRAMUSCULAR | Status: DC | PRN
  Administered 2024-06-19: 25 ug via INTRAVENOUS
  Administered 2024-06-19: 75 ug via INTRAVENOUS

## 2024-06-19 MED ORDER — HYDROMORPHONE HCL 1 MG/ML IJ SOLN
INTRAMUSCULAR | Status: AC
  Filled 2024-06-19: qty 1

## 2024-06-19 MED ORDER — DEXAMETHASONE SODIUM PHOSPHATE 4 MG/ML IJ SOLN
4.0000 mg | Freq: Four times a day (QID) | INTRAMUSCULAR | Status: DC
  Administered 2024-06-19: 4 mg via INTRAVENOUS
  Filled 2024-06-19: qty 1

## 2024-06-19 MED ORDER — ADULT MULTIVITAMIN W/MINERALS CH
1.0000 | ORAL_TABLET | Freq: Every day | ORAL | Status: DC
  Administered 2024-06-19: 1 via ORAL
  Filled 2024-06-19: qty 1

## 2024-06-19 MED ORDER — PROPOFOL 500 MG/50ML IV EMUL
INTRAVENOUS | Status: DC | PRN
  Administered 2024-06-19: 100 ug/kg/min via INTRAVENOUS

## 2024-06-19 MED ORDER — MORPHINE SULFATE (PF) 2 MG/ML IV SOLN: 2.0000 mg | INTRAVENOUS | Status: DC | PRN

## 2024-06-19 MED ORDER — HYDROMORPHONE HCL 1 MG/ML IJ SOLN: 0.2500 mg | INTRAMUSCULAR | Status: DC | PRN

## 2024-06-19 MED ORDER — POTASSIUM CHLORIDE IN NACL 20-0.9 MEQ/L-% IV SOLN
INTRAVENOUS | Status: DC
  Filled 2024-06-19: qty 1000

## 2024-06-19 MED ORDER — HYDROMORPHONE HCL 1 MG/ML IJ SOLN
INTRAMUSCULAR | Status: AC
  Filled 2024-06-19: qty 0.5

## 2024-06-19 MED ORDER — PHENYLEPHRINE 80 MCG/ML (10ML) SYRINGE FOR IV PUSH (FOR BLOOD PRESSURE SUPPORT)
PREFILLED_SYRINGE | INTRAVENOUS | Status: AC
  Filled 2024-06-19: qty 10

## 2024-06-19 NOTE — Plan of Care (Signed)

## 2024-06-19 NOTE — Anesthesia Postprocedure Evaluation (Signed)
"   Anesthesia Post Note  Patient: Whitney  L Lewis  Procedure(s) Performed: POSTERIOR LUMBAR INTERBODY FUSION LUMBAR THREE-LUMBAR FOUR EXTENTION PREVIOUS FUSION (Back)     Patient location during evaluation: PACU Anesthesia Type: General Level of consciousness: awake and alert and oriented Pain management: pain level controlled Vital Signs Assessment: post-procedure vital signs reviewed and stable Respiratory status: spontaneous breathing, nonlabored ventilation and respiratory function stable Cardiovascular status: blood pressure returned to baseline and stable Postop Assessment: no apparent nausea or vomiting Anesthetic complications: no Comments: Has some hoarseness to her voice, likely due to ETT. She was an easy Glidescope intubation.   No notable events documented.  Last Vitals:  Vitals:   06/19/24 1645 06/19/24 1700  BP: 119/61 129/63  Pulse: (!) 58 61  Resp: 10 17  Temp:    SpO2: 100% 100%    Last Pain:  Vitals:   06/19/24 1645  TempSrc:   PainSc: Asleep   Pain Goal: Patients Stated Pain Goal: 2 (06/19/24 0939)  LLE Motor Response: Purposeful movement (06/19/24 1645) LLE Sensation: Full sensation (06/19/24 1645) RLE Motor Response: Purposeful movement (06/19/24 1645) RLE Sensation: Full sensation (06/19/24 1645)        Brittnee Gaetano A.      "

## 2024-06-19 NOTE — Anesthesia Preprocedure Evaluation (Signed)
 "                                  Anesthesia Evaluation  Patient identified by MRN, date of birth, ID band Patient awake    Reviewed: Allergy & Precautions, NPO status , Patient's Chart, lab work & pertinent test results  History of Anesthesia Complications (+) Family history of anesthesia reaction and history of anesthetic complications  Airway Mallampati: II  TM Distance: >3 FB Neck ROM: Full    Dental no notable dental hx. (+) Teeth Intact, Caps, Dental Advisory Given   Pulmonary sleep apnea    Pulmonary exam normal breath sounds clear to auscultation       Cardiovascular hypertension, Pt. on medications Normal cardiovascular exam+ dysrhythmias  Rhythm:Regular Rate:Normal  EKG 07/27/23 NSR, non specific ST-T abnormalities  Echo 05/14/23 1. Left ventricular ejection fraction, by estimation, is 55 to 60%. Left  ventricular ejection fraction by 2D MOD biplane is 56.1 %. The left  ventricle has normal function. The left ventricle has no regional wall  motion abnormalities. There is mild  asymmetric left ventricular hypertrophy of the basal-septal segment. Left  ventricular diastolic parameters are consistent with Grade I diastolic  dysfunction (impaired relaxation).   2. Right ventricular systolic function is normal. The right ventricular  size is normal. There is normal pulmonary artery systolic pressure. The  estimated right ventricular systolic pressure is 20.0 mmHg.   3. The mitral valve is grossly normal. Trivial mitral valve  regurgitation.   4. The aortic valve is tricuspid. Aortic valve regurgitation is not  visualized.   5. Aortic dilatation noted. There is mild dilatation of the aortic root,  measuring 38 mm.   6. The inferior vena cava is normal in size with greater than 50%  respiratory variability, suggesting right atrial pressure of 3 mmHg.      Neuro/Psych  Headaches, Seizures -, Well Controlled,  PSYCHIATRIC DISORDERS      CVA OS decrease  vision Neurogenic claudication  Neuromuscular disease CVA, Residual Symptoms    GI/Hepatic ,GERD  Medicated,,  Endo/Other  negative endocrine ROS    Renal/GU negative Renal ROS  negative genitourinary   Musculoskeletal  (+) Arthritis , Osteoarthritis,  Fibromyalgia -Lumbar spinal stenosis   Abdominal   Peds  Hematology  (+) Blood dyscrasia, anemia Lab Results      Component                Value               Date                      WBC                      10.7 (H)            07/31/2023                HGB                      11.4 (L)            07/31/2023                HCT                      33.8 (L)  07/31/2023                MCV                      92.6                07/31/2023                PLT                      197                 07/31/2023              Anesthesia Other Findings   Reproductive/Obstetrics                              Anesthesia Physical Anesthesia Plan  ASA: 2  Anesthesia Plan: General   Post-op Pain Management: Dilaudid  IV, Precedex and Ofirmev  IV (intra-op)*   Induction: Intravenous  PONV Risk Score and Plan: 4 or greater and Treatment may vary due to age or medical condition, Ondansetron  and Dexamethasone   Airway Management Planned: Oral ETT  Additional Equipment: None  Intra-op Plan:   Post-operative Plan: Extubation in OR  Informed Consent: I have reviewed the patients History and Physical, chart, labs and discussed the procedure including the risks, benefits and alternatives for the proposed anesthesia with the patient or authorized representative who has indicated his/her understanding and acceptance.     Dental advisory given  Plan Discussed with: CRNA and Anesthesiologist  Anesthesia Plan Comments:          Anesthesia Quick Evaluation  "

## 2024-06-19 NOTE — Op Note (Signed)
 06/19/2024  4:04 PM  PATIENT:  Whitney Lewis  64 y.o. 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 Decompressive lumbar laminectomy, hemi facetectomy and foraminotomies L3-4 requiring more work than would be required for a simple exposure of the disk for PLIF in order to adequately decompress the neural elements and address the spinal stenosis 2. Posterior lumbar interbody fusion L3-4 using peek interbody cages packed with morcellized allograft and autograft  3. Posterior fixation L3-4 using NuVasive cortical pedicle screws.  4. Intertransverse arthrodesis L3-4 using morcellized autograft and allograft. 5.  Removal of nonsegmental fixation L4-5  SURGEON:  Alm Molt, MD  ASSISTANTS: Suzen Pean, FNP  ANESTHESIA:  General  EBL: 300 ml  Total I/O In: 1150 [I.V.:900; IV Piggyback:250] Out: 300 [Blood:300]  BLOOD ADMINISTERED:none  DRAINS: none   INDICATION FOR PROCEDURE: This patient presented with pain with neurogenic claudication. Imaging revealed adjacent level degeneration with spondylosis and degenerative disc disease with severe stenosis L3-4 above her previous successful L4-5 fusion. The patient tried a reasonable attempt at conservative medical measures without relief. I recommended decompression and instrumented fusion to address the stenosis as well as the segmental  instability.  Patient understood the risks, benefits, and alternatives and potential outcomes and wished to proceed.  PROCEDURE DETAILS:  The patient was brought to the operating room. After induction of generalized endotracheal anesthesia the patient was rolled into the prone position on chest rolls and all pressure points were padded. The patient's lumbar region was cleaned and then prepped with DuraPrep and draped in the usual sterile fashion. Anesthesia was  injected and then a dorsal midline incision was made and carried down to the lumbosacral fascia. The fascia was opened and the paraspinous musculature was taken down in a subperiosteal fashion to expose L3-4 and the previously placed instrumentation.  We remove the locking caps from the previously placed screws and remove the rods.  The screws had excellent purchase.  A self-retaining retractor was placed. Intraoperative fluoroscopy confirmed my level, and I started with placement of the L3 cortical pedicle screws. The pedicle screw entry zones were identified utilizing surface landmarks and  AP and lateral fluoroscopy. I scored the cortex with the high-speed drill and then used the hand drill to drill an upward and outward direction into the pedicle. I then tapped line to line. I then placed a 6.5 x 40 mm cortical pedicle screw into the pedicles of L3 bilaterally.    I then turned my attention to the decompression and complete lumbar laminectomies, hemi- facetectomies, and foraminotomies were performed at L3-4.  My nurse practitioner was directly involved in the decompression and exposure of the neural elements. the patient had significant spinal stenosis and this required more work than would be required for a simple exposure of the disc for posterior lumbar interbody fusion which would only require a limited laminotomy. Much more generous decompression and generous foraminotomy was undertaken in order to adequately decompress the neural elements and address the patient's leg pain. The yellow ligament was removed to expose the underlying dura and nerve roots, and generous foraminotomies were performed to adequately decompress the neural elements. Both the exiting and traversing nerve roots were decompressed on both sides until a coronary dilator passed easily along the nerve roots. Once the decompression was complete, I turned my attention to the posterior lower lumbar interbody fusion. The epidural venous  vasculature  was coagulated and cut sharply. Disc space was incised and the initial discectomy was performed with pituitary rongeurs. The disc space was distracted with sequential distractors to a height of 9 mm. We then used a series of scrapers and shavers to prepare the endplates for fusion. The midline was prepared with Epstein curettes. Once the complete discectomy was finished, we packed an appropriate sized interbody cage with local autograft and morcellized allograft, gently retracted the nerve root, and tapped the cage into position at L3-4.  The midline between the cages was packed with morselized autograft and allograft.     We then decorticated the transverse processes and laid a mixture of morcellized autograft and allograft out over these to perform intertransverse arthrodesis at L3-4. We then placed lordotic rods into the multiaxial screw heads of the pedicle screws and locked these in position with the locking caps and anti-torque device. We then checked our construct with AP and lateral fluoroscopy. Irrigated with copious amounts of 0.5% povidone iodine  solution followed by saline solution. Inspected the nerve roots once again to assure adequate decompression, lined to the dura with Gelfoam,  and then we closed the muscle and the fascia with 0 Vicryl. Closed the subcutaneous tissues with 2-0 Vicryl and subcuticular tissues with 3-0 Vicryl. The skin was closed with benzoin and Steri-Strips. Dressing was then applied, the patient was awakened from general anesthesia and transported to the recovery room in stable condition. At the end of the procedure all sponge, needle and instrument counts were correct.   PLAN OF CARE: admit to inpatient  PATIENT DISPOSITION:  PACU - hemodynamically stable.   Delay start of Pharmacological VTE agent (>24hrs) due to surgical blood loss or risk of bleeding:  yes

## 2024-06-19 NOTE — H&P (Signed)
 Subjective: Patient is a 64 y.o. female admitted for back and leg pain. Onset of symptoms was several months ago, gradually worsening since that time.  The pain is rated severe, and is located at the across the lower back and radiates to legs. The pain is described as aching and occurs all day. The symptoms have been progressive. Symptoms are exacerbated by exercise, standing, and walking for more than a few minutes. MRI or CT showed adjacent level stenosis   Past Medical History:  Diagnosis Date   ADD (attention deficit disorder)    Anxiety    Arthritis    feet, knees (07/04/2015)   Bipolar disorder (HCC)    Complication of anesthesia    hard to wake up, blood pressure drops    Dysrhythmia    PVCs,   Family history of adverse reaction to anesthesia    sister hard to wake up also   Fibromyalgia    GERD (gastroesophageal reflux disease)    Hyperlipidemia    Hypertension    Migraine    hormonal; stopped when I was 16 (07/04/2015)   OSA (obstructive sleep apnea)    wears mouth piece, no CPAP   Seizures (HCC)     Past Surgical History:  Procedure Laterality Date   ABDOMINAL HYSTERECTOMY  ~ 1993   CESAREAN SECTION  1987   COLONOSCOPY     COLONOSCOPY WITH PROPOFOL  N/A 09/21/2017   Procedure: COLONOSCOPY WITH PROPOFOL ;  Surgeon: Gaylyn Gladis PENNER, MD;  Location: [facility] END[facility];  Service: Endoscopy;  Laterality: N/A;   EYE SURGERY     gets injections in left retina, 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   HAMMER TOE SURGERY Right 03/2015   HAMMER TOE SURGERY Left 06/06/2015   Procedure: LEFT TWO-THREE  HAMMER TOE CORRECTION ;  Surgeon: Norleen Armor, MD;  Location: [facility];  Service: Orthopedics;  Laterality: Left;   INCISION AND DRAINAGE OF WOUND Right 07/04/2015   Procedure: IRRIGATION AND DEBRIDEMENT OF RIGHT FOOT DEEP ABSCESS; EXCISION OF RIGHT SECOND METATARSAL HEAD; REMOVAL OF DEEP IMPLANT RIGHT FOOT; EXCISION OF RIGHT SECOND TOE PROXIMAL PHALANX BASE; INSERTION OF  ANTIBIOTIC BEADS INTO RIGHT FOOT ABSCESS;  Surgeon: Norleen Armor, MD;  Location: [facility] OR;  Service: Orthopedics;  Laterality: Right;  SITE ALSO  RIGHT FOOT   IRRIGATION AND DEBRIDEMENT FOOT Right 07/04/2015   Irrigation and excisional debridement of right foot deep abscess;; Insertion of antibiotic beads into the right foot abscess   JOINT REPLACEMENT     MAXIMUM ACCESS (MAS)POSTERIOR LUMBAR INTERBODY FUSION (PLIF) 1 LEVEL N/A 01/01/2017   Procedure: L4-5 MAXIMUM ACCESS (MAS) POSTERIOR LUMBAR INTERBODY FUSION (PLIF);  Surgeon: Joshua Alm RAMAN, MD;  Location: [facility] OR;  Service: Neurosurgery;  Laterality: N/A;   TOTAL HIP ARTHROPLASTY Left 07/30/2023   Procedure: LEFT TOTAL HIP ARTHROPLASTY ANTERIOR APPROACH;  Surgeon: Vernetta Lonni GRADE, MD;  Location: [facility];  Service: Orthopedics;  Laterality: Left;   TOTAL KNEE ARTHROPLASTY Left    WEIL OSTEOTOMY Left 06/06/2015   Procedure: LEFT TWO AND THREE METATARSAL WEIL OSTEOTOMY ;  Surgeon: Norleen Armor, MD;  Location: [facility];  Service: Orthopedics;  Laterality: Left;    Prior to Admission medications  Medication Sig Start Date End Date Taking? Authorizing Provider  cetirizine (ZYRTEC) 10 MG tablet Take 10 mg by mouth daily as needed for allergies.   Yes [provider]  cycloSPORINE  (RESTASIS ) 0.05 % ophthalmic emulsion Place 1 drop into both eyes 2 (two) times daily. 06/01/23  Yes [provider]  methylphenidate  (RITALIN ) 20 MG tablet Take 20 mg by mouth 2 (two) times daily as needed (ADD). 07/20/23  Yes [provider]  Multiple Vitamin (MULTIVITAMIN WITH MINERALS) TABS tablet Take 1 tablet by mouth daily.   Yes [provider]  Propylene Glycol (SYSTANE COMPLETE) 0.6 % SOLN Place 1 drop into both eyes as needed (dry eyes).   Yes [provider]  rosuvastatin (CRESTOR) 20 MG tablet Take 20 mg by mouth at bedtime.   Yes [provider]  sodium chloride  (OCEAN) 0.65 % SOLN nasal spray  Place 1 spray into both nostrils as needed for congestion.   Yes [provider]  Specialty Vitamins Products (BLINK NUTRITEARS) CAPS Take 1 capsule by mouth daily.   Yes [provider]  valsartan -hydrochlorothiazide  (DIOVAN -HCT) 160-12.5 MG tablet Take 1 tablet by mouth daily. 05/28/23  Yes [provider]  zolpidem  (AMBIEN ) 10 MG tablet Take 5-10 mg by mouth at bedtime as needed for sleep. 07/07/23  Yes [provider]   Allergies[1]  Social History   Tobacco Use   Smoking status: Never   Smokeless tobacco: Never  Substance Use Topics   Alcohol  use: Yes    Alcohol /week: 6.0 standard drinks of alcohol     Types: 6 Glasses of wine per week    Comment: occasional none last 24hrs    Family History  Problem Relation Age of Onset   Heart disease Mother    Breast cancer Neg Hx      Review of Systems  Positive ROS: neg  All other systems have been reviewed and were otherwise negative with the exception of those mentioned in the HPI and as above.  Objective: Vital signs in last 24 hours:    General Appearance: Alert, cooperative, no distress, appears stated age Head: Normocephalic, without obvious abnormality, atraumatic Eyes: PERRL, conjunctiva/corneas clear, EOM's intact    Neck: Supple, symmetrical, trachea midline Back: Symmetric, no curvature, ROM normal, no CVA tenderness Lungs:  respirations unlabored Heart: Regular rate and rhythm Abdomen: Soft, non-tender Extremities: Extremities normal, atraumatic, no cyanosis or edema Pulses: 2+ and symmetric all extremities Skin: Skin color, texture, turgor normal, no rashes or lesions  NEUROLOGIC:   Mental status: Alert and oriented x4,  no aphasia, good attention span, fund of knowledge, and memory Motor Exam - grossly normal Sensory Exam - grossly normal Reflexes: 1+ Coordination - grossly normal Gait - grossly normal Balance - grossly normal Cranial Nerves: I: smell Not tested  II:  visual acuity  OS: nl    OD: nl  II: visual fields Full to confrontation  II: pupils Equal, round, reactive to light  III,VII: ptosis None  III,IV,VI: extraocular muscles  Full ROM  V: mastication Normal  V: facial light touch sensation  Normal  V,VII: corneal reflex  Present  VII: facial muscle function - upper  Normal  VII: facial muscle function - lower Normal  VIII: hearing Not tested  IX: soft palate elevation  Normal  IX,X: gag reflex Present  XI: trapezius strength  5/5  XI: sternocleidomastoid strength 5/5  XI: neck flexion strength  5/5  XII: tongue strength  Normal    Data Review Lab Results  Component Value Date   WBC 10.7 (H) 07/31/2023   HGB 11.4 (L) 07/31/2023   HCT 33.8 (L) 07/31/2023   MCV 92.6 07/31/2023   PLT 197 07/31/2023   Lab Results  Component Value Date   NA 133 (L) 07/31/2023   K 4.1 07/31/2023  CL 103 07/31/2023   CO2 21 (L) 07/31/2023   BUN 11 07/31/2023   CREATININE 0.57 07/31/2023   GLUCOSE 145 (H) 07/31/2023   Lab Results  Component Value Date   INR 1.03 12/29/2016    Assessment/Plan:  Estimated body mass index is 23.4 kg/m as calculated from the following:   Height as of 12/15/23: 5' 6 (1.676 m).   Weight as of 12/15/23: 65.8 kg. Patient admitted for PLIF L3-4. Patient has failed a reasonable attempt at conservative therapy.  I explained the condition and procedure to the patient and answered any questions.  Patient wishes to proceed with procedure as planned. Understands risks/ benefits and typical outcomes of procedure.   Alm GORMAN Molt 06/19/2024 9:18 AM      [1]  Allergies Allergen Reactions   Cefazolin  Rash    Quick onset after 2nd dose of cefazolin , full body rash, no mucosal involvement   Acyclovir Other (See Comments)    Pt unsure of reaction    Codeine Nausea And Vomiting   Diclofenac Sodium Rash   Oxycodone -Acetaminophen  Nausea And Vomiting   Voltaren [Diclofenac Sodium] Rash

## 2024-06-19 NOTE — Transfer of Care (Signed)
 Immediate Anesthesia Transfer of Care Note  Patient: Whitney  L Lewis  Procedure(s) Performed: POSTERIOR LUMBAR INTERBODY FUSION LUMBAR THREE-LUMBAR FOUR EXTENTION PREVIOUS FUSION (Back)  Patient Location: PACU  Anesthesia Type:General  Level of Consciousness: awake, alert , oriented, and patient cooperative  Airway & Oxygen Therapy: Patient Spontanous Breathing and Patient connected to face mask oxygen  Post-op Assessment: Report given to RN, Post -op Vital signs reviewed and stable, and Patient moving all extremities X 4  Post vital signs: Reviewed and stable  Last Vitals:  Vitals Value Taken Time  BP 99/76 06/19/24 16:15  Temp 36.6 C 06/19/24 16:15  Pulse 68 06/19/24 16:21  Resp 21 06/19/24 16:21  SpO2 84 % 06/19/24 16:21  Vitals shown include unfiled device data.  Last Pain:  Vitals:   06/19/24 0947  TempSrc:   PainSc: 4       Patients Stated Pain Goal: 2 (06/19/24 0939)  Complications: No notable events documented.

## 2024-06-19 NOTE — Anesthesia Procedure Notes (Signed)
 Procedure Name: Intubation Date/Time: 06/19/2024 1:59 PM  Performed by: Mollie Olivia SAUNDERS, CRNAPre-anesthesia Checklist: Patient identified, Emergency Drugs available, Suction available and Patient being monitored Patient Re-evaluated:Patient Re-evaluated prior to induction Oxygen Delivery Method: Circle system utilized Preoxygenation: Pre-oxygenation with 100% oxygen Induction Type: IV induction Ventilation: Mask ventilation without difficulty Laryngoscope Size: Glidescope and 3 Grade View: Grade II Tube type: Oral Tube size: 7.0 mm Number of attempts: 1 Airway Equipment and Method: Stylet and Video-laryngoscopy Placement Confirmation: ETT inserted through vocal cords under direct vision, positive ETCO2 and breath sounds checked- equal and bilateral Secured at: 22 cm Tube secured with: Tape Dental Injury: Teeth and Oropharynx as per pre-operative assessment  Difficulty Due To: Difficulty was anticipated

## 2024-06-20 ENCOUNTER — Other Ambulatory Visit (HOSPITAL_COMMUNITY): Payer: Self-pay

## 2024-06-20 DIAGNOSIS — M48062 Spinal stenosis, lumbar region with neurogenic claudication: Secondary | ICD-10-CM | POA: Diagnosis not present

## 2024-06-20 MED ORDER — METHOCARBAMOL 500 MG PO TABS
500.0000 mg | ORAL_TABLET | Freq: Three times a day (TID) | ORAL | 1 refills | Status: AC | PRN
  Filled 2024-06-20: qty 60, 20d supply, fill #0

## 2024-06-20 MED ORDER — OXYCODONE HCL 5 MG PO TABS
5.0000 mg | ORAL_TABLET | Freq: Four times a day (QID) | ORAL | 0 refills | Status: AC | PRN
  Filled 2024-06-20: qty 28, 7d supply, fill #0

## 2024-06-20 NOTE — Discharge Summary (Signed)
 " Physician Discharge Summary  Patient ID: Whitney Lewis MRN: [id number] DOB/AGE: Nov 21, 1960 64 y.o.  Admit date: 06/19/2024 Discharge date: 06/20/2024  Admission Diagnoses: adjacent level degeneration with spinal stenosis    Discharge Diagnoses: same   Discharged Condition: good  Hospital Course: The patient was admitted on 06/19/2024 and taken to the operating room where the patient underwent PLIF L3-4. The patient tolerated the procedure well and was taken to the recovery room and then to the floor in stable condition. The hospital course was routine. There were no complications. The wound remained clean dry and intact. Pt had appropriate back soreness. No complaints of leg pain or new N/T/W. The patient remained afebrile with stable vital signs, and tolerated a regular diet. The patient continued to increase activities, and pain was well controlled with oral pain medications.   Consults: None  Significant Diagnostic Studies:  Results for orders placed or performed during the hospital encounter of 06/19/24  Basic metabolic panel per protocol   Collection Time: 06/19/24 10:00 AM  Result Value Ref Range   Sodium 137 135 - 145 mmol/L   Potassium 3.8 3.5 - 5.1 mmol/L   Chloride 102 98 - 111 mmol/L   CO2 21 (L) 22 - 32 mmol/L   Glucose, Bld 98 70 - 99 mg/dL   BUN 11 8 - 23 mg/dL   Creatinine, Ser 9.39 0.44 - 1.00 mg/dL   Calcium 9.1 8.9 - 89.6 mg/dL   GFR, Estimated >39 >39 mL/min   Anion gap 14 5 - 15  CBC per protocol   Collection Time: 06/19/24 10:00 AM  Result Value Ref Range   WBC 5.3 4.0 - 10.5 K/uL   RBC 4.20 3.87 - 5.11 MIL/uL   Hemoglobin 13.3 12.0 - 15.0 g/dL   HCT 63.9 63.9 - 53.9 %   MCV 85.7 80.0 - 100.0 fL   MCH 31.7 26.0 - 34.0 pg   MCHC 36.9 (H) 30.0 - 36.0 g/dL   RDW 88.7 (L) 88.4 - 84.4 %   Platelets 258 150 - 400 K/uL   nRBC 0.0 0.0 - 0.2 %  Type and screen [facility]   Collection Time: 06/19/24 10:00 AM  Result Value Ref Range    ABO/RH(D) AB NEG    Antibody Screen NEG    Sample Expiration      06/22/2024,2359 Performed at [facility], [address], [place], [place] [address]   Surgical pcr screen   Collection Time: 06/19/24 10:57 AM   Specimen: Nasal Mucosa; Nasal Swab  Result Value Ref Range   MRSA, PCR NEGATIVE NEGATIVE   Staphylococcus aureus POSITIVE (A) NEGATIVE    DG Lumbar Spine 2-3 Views Result Date: 06/19/2024 CLINICAL DATA:  [id number] Surgery, elective [id number] EXAM: LUMBAR SPINE - 2-3 VIEW COMPARISON:  None Available. FINDINGS: The provided image demonstrates L4 through S1 posterior spinal fixation with intervening disc spacers. Please refer to the separately issued operative report for further details. Fluoroscopy time: 358 seconds Radiation dose: 20.95 mGy IMPRESSION: *Intraoperative fluoroscopic guidance, as described above. Electronically Signed   By: Ree Molt M.D.   On: 06/19/2024 16:06   DG C-Arm 1-60 Min-No Report Result Date: 06/19/2024 Fluoroscopy was utilized by the requesting physician.  No radiographic interpretation.   DG C-Arm 1-60 Min-No Report Result Date: 06/19/2024 Fluoroscopy was utilized by the requesting physician.  No radiographic interpretation.    Antibiotics:  Anti-infectives (From admission, onward)    Start     Dose/Rate Route Frequency Ordered  Stop   06/19/24 2330  vancomycin  (VANCOCIN ) IVPB 1000 mg/200 mL premix        1,000 mg 200 mL/hr over 60 Minutes Intravenous  Once 06/19/24 1805 06/20/24 0018   06/19/24 1000  vancomycin  (VANCOCIN ) IVPB 1000 mg/200 mL premix        1,000 mg 200 mL/hr over 60 Minutes Intravenous  Once 06/19/24 0952 06/19/24 1232   06/19/24 0915  ceFAZolin  (ANCEF ) IVPB 2g/100 mL premix  Status:  Discontinued        2 g 200 mL/hr over 30 Minutes Intravenous On call to O.R. 06/19/24 0910 06/19/24 0952       Discharge Exam: Blood pressure (!) 141/78, pulse 75, temperature 98.2 F (36.8 C), temperature source Oral, resp. rate 18,  height 5' 6 (1.676 m), weight 65.8 kg, SpO2 97%. Neurologic: Grossly normal Dressing dry  Discharge Medications:   Allergies as of 06/20/2024       Reactions   Cefazolin  Rash   Quick onset after 2nd dose of cefazolin , full body rash, no mucosal involvement   Acyclovir Other (See Comments)   Pt unsure of reaction    Codeine Nausea And Vomiting   Diclofenac Sodium Rash   Oxycodone -acetaminophen  Nausea And Vomiting   Voltaren [diclofenac Sodium] Rash        Medication List     TAKE these medications    Blink NutriTears Caps Take 1 capsule by mouth daily.   cetirizine 10 MG tablet Commonly known as: ZYRTEC Take 10 mg by mouth daily as needed for allergies.   chlorhexidine  4 % external liquid Commonly known as: HIBICLENS  Apply 15 mLs (1 Application total) topically as directed for 30 doses. Use as directed daily for 5 days every other week for 6 weeks.   cycloSPORINE  0.05 % ophthalmic emulsion Commonly known as: RESTASIS  Place 1 drop into both eyes 2 (two) times daily.   methocarbamol  500 MG tablet Commonly known as: ROBAXIN  Take 1 tablet (500 mg total) by mouth every 8 (eight) hours as needed for muscle spasms.   multivitamin with minerals Tabs tablet Take 1 tablet by mouth daily.   oxyCODONE  5 MG immediate release tablet Commonly known as: Oxy IR/ROXICODONE  Take 1 tablet (5 mg total) by mouth every 6 (six) hours as needed for moderate pain (pain score 4-6).   Ritalin  20 MG tablet Generic drug: methylphenidate  Take 20 mg by mouth 2 (two) times daily as needed (ADD).   rosuvastatin 20 MG tablet Commonly known as: CRESTOR Take 20 mg by mouth at bedtime.   sodium chloride  0.65 % Soln nasal spray Commonly known as: OCEAN Place 1 spray into both nostrils as needed for congestion.   Systane Complete 0.6 % Soln Generic drug: Propylene Glycol Place 1 drop into both eyes as needed (dry eyes).   valsartan -hydrochlorothiazide  160-12.5 MG tablet Commonly known as:  DIOVAN -HCT Take 1 tablet by mouth daily.   zolpidem  10 MG tablet Commonly known as: AMBIEN  Take 5-10 mg by mouth at bedtime as needed for sleep.               Durable Medical Equipment  (From admission, onward)           Start     Ordered   06/19/24 1806  DME Walker rolling  Once       Question:  Patient needs a walker to treat with the following condition  Answer:  S/P lumbar fusion   06/19/24 1805   06/19/24 1806  DME 3 n 1  Once        06/19/24 1805            Disposition: home   Final Dx: PLIF L3-4  Discharge Instructions      Remove dressing in 72 hours   Complete by: As directed    Call MD for:  difficulty breathing, headache or visual disturbances   Complete by: As directed    Call MD for:  persistant nausea and vomiting   Complete by: As directed    Call MD for:  redness, tenderness, or signs of infection (pain, swelling, redness, odor or green/yellow discharge around incision site)   Complete by: As directed    Call MD for:  severe uncontrolled pain   Complete by: As directed    Call MD for:  temperature >100.4   Complete by: As directed    Increase activity slowly   Complete by: As directed           Signed: Alm GORMAN Molt 06/20/2024, 8:04 AM   "

## 2024-06-20 NOTE — Evaluation (Signed)
 Physical Therapy Evaluation and Discharge  Patient Details Name: Whitney Lewis MRN: [id number] DOB: May 25, 1961 Today's Date: 06/20/2024  History of Present Illness  Pt is a 64 y.o. female s/p PLIF L3-4 extension of previous fusion on 06/16/2024. PMH: ADD, anxiety, arthritis, bipolar, dysrhythmia, fibromyalgia, HTN, OSA, seizures.  Clinical Impression  Patient evaluated by Physical Therapy with no further acute PT needs identified. All education has been completed and the patient has no further questions. Pt was able to demonstrate transfers and ambulation with gross modified independence and no AD. Pt was educated on precautions, brace application/wearing schedule, appropriate activity progression, and car transfer. See below for any follow-up Physical Therapy or equipment needs. PT is signing off. Thank you for this referral.       If plan is discharge home, recommend the following:     Can travel by private vehicle        Equipment Recommendations None recommended by PT  Recommendations for Other Services       Functional Status Assessment Patient has had a recent decline in their functional status and demonstrates the ability to make significant improvements in function in a reasonable and predictable amount of time.     Precautions / Restrictions Precautions Precautions: Back;Fall Precaution Booklet Issued: Yes (comment) Recall of Precautions/Restrictions: Intact Precaution/Restrictions Comments: all precautions reviewed within the context of BADL Required Braces or Orthoses: Spinal Brace Spinal Brace: Lumbar corset;Applied in sitting position Restrictions Weight Bearing Restrictions Per Provider Order: No      Mobility  Bed Mobility Overal bed mobility: Modified Independent             General bed mobility comments: Good log roll technique with HOB flat and rails lowered to simulate home environment.    Transfers Overall transfer level: Modified  independent Equipment used: None Transfers: Sit to/from Stand             General transfer comment: No assist to power up to full stand.    Ambulation/Gait Ambulation/Gait assistance: Modified independent (Device/Increase time) Gait Distance (Feet): 500 Feet Assistive device: None Gait Pattern/deviations: Step-through pattern, Decreased stride length Gait velocity: Decreased Gait velocity interpretation: 1.31 - 2.62 ft/sec, indicative of limited community ambulator   General Gait Details: Good posture throughout. Minimal deviation  Stairs Stairs: Yes Stairs assistance: Supervision Stair Management: One rail Left, Alternating pattern, Forwards Number of Stairs: 10 General stair comments: VC's for sequencing and general safety.  Wheelchair Mobility     Tilt Bed    Modified Rankin (Stroke Patients Only)       Balance Overall balance assessment: Mild deficits observed, not formally tested                                           Pertinent Vitals/Pain Pain Assessment Pain Assessment: Faces Faces Pain Scale: Hurts a little bit Pain Location: operative site Pain Descriptors / Indicators: Operative site guarding, Sore Pain Intervention(s): Limited activity within patient's tolerance, Monitored during session, Repositioned    Home Living Family/patient expects to be discharged to:: Private residence Living Arrangements: Spouse/significant other;Children Available Help at Discharge: Family Type of Home: House Home Access: Stairs to enter Entrance Stairs-Rails: Left Entrance Stairs-Number of Steps: 6   Home Layout: Two level;Able to live on main level with bedroom/bathroom Home Equipment: Crutches      Prior Function Prior Level of Function : Independent/Modified Independent;Driving  Mobility Comments: IND no AD for all ADLs, self care tasks and IADLs       Extremity/Trunk Assessment   Upper Extremity Assessment Upper  Extremity Assessment: Overall WFL for tasks assessed    Lower Extremity Assessment Lower Extremity Assessment: Generalized weakness (Mild; consistent with pre-op diagnosis)    Cervical / Trunk Assessment Cervical / Trunk Assessment: Back Surgery  Communication   Communication Communication: No apparent difficulties    Cognition Arousal: Alert Behavior During Therapy: WFL for tasks assessed/performed   PT - Cognitive impairments: No apparent impairments                         Following commands: Intact       Cueing       General Comments      Exercises     Assessment/Plan    PT Assessment Patient does not need any further PT services  PT Problem List         PT Treatment Interventions      PT Goals (Current goals can be found in the Care Plan section)  Acute Rehab PT Goals Patient Stated Goal: Home today PT Goal Formulation: All assessment and education complete, DC therapy    Frequency       Co-evaluation               AM-PAC PT 6 Clicks Mobility  Outcome Measure Help needed turning from your back to your side while in a flat bed without using bedrails?: None Help needed moving from lying on your back to sitting on the side of a flat bed without using bedrails?: None Help needed moving to and from a bed to a chair (including a wheelchair)?: None Help needed standing up from a chair using your arms (e.g., wheelchair or bedside chair)?: None Help needed to walk in hospital room?: None Help needed climbing 3-5 steps with a railing? : None 6 Click Score: 24    End of Session Equipment Utilized During Treatment: Gait belt;Back brace Activity Tolerance: Patient tolerated treatment well Patient left: in chair;with call bell/phone within reach Nurse Communication: Mobility status PT Visit Diagnosis: Unsteadiness on feet (R26.81);Pain Pain - part of body:  (back)    Time: 9150-9098 PT Time Calculation (min) (ACUTE ONLY): 12  min   Charges:   PT Evaluation $PT Eval Low Complexity: 1 Low   PT General Charges $$ ACUTE PT VISIT: 1 Visit         Leita Sable, PT, DPT Acute Rehabilitation Services Secure Chat Preferred Office: [phone]   Leita JONETTA Sable 06/20/2024, 9:23 AM

## 2024-06-20 NOTE — Care Management (Signed)
 Patient with order to DC to home today. Unit staff to provide DME needed for home.    No HH Services needed  Patient will have family/ friends provide transportation home. No other TOC needs identified for DC

## 2024-06-20 NOTE — Evaluation (Signed)
 Occupational Therapy Evaluation Patient Details Name: Whitney Lewis MRN: [id number] DOB: Apr 21, 1961 Today's Date: 06/20/2024   History of Present Illness   Pt is a 64 y.o. female s/p PLIF L3-4 extension of previous fusion. PMH: ADD, anxiety, arthritis, bipolar, dysrhythmia, fibromyalgia, HLD, HTN, OSA, seizures.     Clinical Impressions PTA, pt living with spouse and reports being independent; unable to walk as much as she likes frequently 2/2 pain. Upon eval, pt performing UB ADL with set-up-supervision and LB ADL with up to mod A. Reviewed AE to optimize independence, but per pt, spouse to assist at home. Pt educated and demonstrating use of compensatory techniques for bed mobility, LB ADL, grooming, brace application, toileting, and shower transfers within precautions. All education provided and questions answered. Recommending discharge home with no OT follow up at this time. OT to sign off. Please re-consult if change in status.        If plan is discharge home, recommend the following:   A little help with walking and/or transfers;A little help with bathing/dressing/bathroom;Assistance with cooking/housework;Assist for transportation;Help with stairs or ramp for entrance     Functional Status Assessment   Patient has had a recent decline in their functional status and demonstrates the ability to make significant improvements in function in a reasonable and predictable amount of time.     Equipment Recommendations   None recommended by OT     Recommendations for Other Services         Precautions/Restrictions   Precautions Precautions: Back Precaution Booklet Issued: Yes (comment) Recall of Precautions/Restrictions: Intact Precaution/Restrictions Comments: all precautions reviewed within the context of BADL Required Braces or Orthoses: Spinal Brace Spinal Brace: Lumbar corset;Applied in sitting position Restrictions Weight Bearing Restrictions Per Provider  Order: No     Mobility Bed Mobility Overal bed mobility: Needs Assistance Bed Mobility: Rolling, Sidelying to Sit, Sit to Sidelying Rolling: Supervision Sidelying to sit: Contact guard assist     Sit to sidelying: Contact guard assist      Transfers Overall transfer level: Needs assistance Equipment used: None Transfers: Sit to/from Stand Sit to Stand: Contact guard assist           General transfer comment: CGA for slow, steady rise      Balance Overall balance assessment: Mild deficits observed, not formally tested                                         ADL either performed or assessed with clinical judgement   ADL Overall ADL's : Needs assistance/impaired Eating/Feeding: Independent   Grooming: Supervision/safety;Standing   Upper Body Bathing: Supervision/ safety;Standing   Lower Body Bathing: Minimal assistance;Sit to/from stand   Upper Body Dressing : Supervision/safety;Standing   Lower Body Dressing: Sit to/from stand;Moderate assistance Lower Body Dressing Details (indicate cue type and reason): to thread L pant leg; manage L sock Toilet Transfer: Contact guard assist;Ambulation;Supervision/safety     Toileting - Clothing Manipulation Details (indicate cue type and reason): reviewed compensatory techniques Tub/ Shower Transfer: Supervision/safety;Ambulation;Tub transfer   Functional mobility during ADLs: Supervision/safety;Contact guard assist       Vision Patient Visual Report: No change from baseline       Perception Perception: Within Functional Limits       Praxis Praxis: WFL       Pertinent Vitals/Pain Pain Assessment Pain Assessment: Faces Faces Pain Scale: Hurts little more Pain Location:  operative site Pain Descriptors / Indicators: Guarding Pain Intervention(s): Limited activity within patient's tolerance, Monitored during session     Extremity/Trunk Assessment Upper Extremity Assessment Upper Extremity  Assessment: Overall WFL for tasks assessed   Lower Extremity Assessment Lower Extremity Assessment: Defer to PT evaluation   Cervical / Trunk Assessment Cervical / Trunk Assessment: Back Surgery   Communication Communication Communication: No apparent difficulties   Cognition Arousal: Alert Behavior During Therapy: WFL for tasks assessed/performed Cognition: No apparent impairments                               Following commands: Intact       Cueing  General Comments          Exercises     Shoulder Instructions      Home Living Family/patient expects to be discharged to:: Private residence Living Arrangements: Spouse/significant other;Children (son to stay initially) Available Help at Discharge: Family Type of Home: House Home Access: Stairs to enter Secretary/administrator of Steps: 6 Entrance Stairs-Rails: Left Home Layout: Two level;Able to live on main level with bedroom/bathroom     Bathroom Shower/Tub: Chief Strategy Officer: Standard     Home Equipment: Crutches (had RW but unsure if she still has)          Prior Functioning/Environment Prior Level of Function : Independent/Modified Independent;Driving             Mobility Comments: IND no AD for all ADLs, self care tasks and IADLs      OT Problem List: Decreased strength;Decreased activity tolerance;Impaired balance (sitting and/or standing);Decreased knowledge of precautions;Pain   OT Treatment/Interventions:        OT Goals(Current goals can be found in the care plan section)   Acute Rehab OT Goals Patient Stated Goal: get better OT Goal Formulation: With patient Time For Goal Achievement: 07/04/24 Potential to Achieve Goals: Good   OT Frequency:       Co-evaluation              AM-PAC OT 6 Clicks Daily Activity     Outcome Measure Help from another person eating meals?: None Help from another person taking care of personal grooming?: A  Little Help from another person toileting, which includes using toliet, bedpan, or urinal?: A Little Help from another person bathing (including washing, rinsing, drying)?: A Little Help from another person to put on and taking off regular upper body clothing?: A Little Help from another person to put on and taking off regular lower body clothing?: A Lot 6 Click Score: 18   End of Session Equipment Utilized During Treatment: Gait belt;Back brace Nurse Communication: Mobility status  Activity Tolerance: Patient tolerated treatment well Patient left: in bed;with call bell/phone within reach  OT Visit Diagnosis: Unsteadiness on feet (R26.81);Muscle weakness (generalized) (M62.81);Pain                Time: 9191-9169 OT Time Calculation (min): 22 min Charges:  OT General Charges $OT Visit: 1 Visit OT Evaluation $OT Eval Low Complexity: 1 Low  Elma JONETTA Lebron FREDERICK, OTR/L [facility] Acute Rehabilitation Office: [phone]   Elma JONETTA Lebron 06/20/2024, 8:44 AM

## 2024-06-26 ENCOUNTER — Other Ambulatory Visit: Payer: Self-pay

## 2024-06-26 DIAGNOSIS — I872 Venous insufficiency (chronic) (peripheral): Secondary | ICD-10-CM

## 2024-07-20 ENCOUNTER — Ambulatory Visit (HOSPITAL_COMMUNITY)

## 2024-07-20 ENCOUNTER — Encounter
